# Patient Record
Sex: Female | Born: 1990
Health system: Southern US, Community
[De-identification: ages and names within clinical notes are randomized; demographics above are authoritative.]

## PROBLEM LIST (undated history)

## (undated) DIAGNOSIS — M25569 Pain in unspecified knee: Secondary | ICD-10-CM

## (undated) DIAGNOSIS — G5632 Lesion of radial nerve, left upper limb: Secondary | ICD-10-CM

## (undated) DIAGNOSIS — F329 Major depressive disorder, single episode, unspecified: Secondary | ICD-10-CM

## (undated) DIAGNOSIS — R12 Heartburn: Secondary | ICD-10-CM

## (undated) DIAGNOSIS — M549 Dorsalgia, unspecified: Secondary | ICD-10-CM

## (undated) DIAGNOSIS — R0602 Shortness of breath: Secondary | ICD-10-CM

## (undated) DIAGNOSIS — G8929 Other chronic pain: Secondary | ICD-10-CM

## (undated) DIAGNOSIS — M2550 Pain in unspecified joint: Secondary | ICD-10-CM

## (undated) DIAGNOSIS — T7840XA Allergy, unspecified, initial encounter: Secondary | ICD-10-CM

## (undated) DIAGNOSIS — F419 Anxiety disorder, unspecified: Secondary | ICD-10-CM

## (undated) DIAGNOSIS — F32A Depression, unspecified: Secondary | ICD-10-CM

## (undated) DIAGNOSIS — J45909 Unspecified asthma, uncomplicated: Secondary | ICD-10-CM

## (undated) DIAGNOSIS — T148XXA Other injury of unspecified body region, initial encounter: Secondary | ICD-10-CM

## (undated) DIAGNOSIS — G709 Myoneural disorder, unspecified: Secondary | ICD-10-CM

## (undated) HISTORY — DX: Pain in unspecified joint: M25.50

## (undated) HISTORY — PX: OTHER SURGICAL HISTORY: SHX169

## (undated) HISTORY — PX: FRACTURE SURGERY: SHX138

## (undated) HISTORY — DX: Shortness of breath: R06.02

## (undated) HISTORY — PX: HERNIA REPAIR: SHX51

## (undated) HISTORY — DX: Allergy, unspecified, initial encounter: T78.40XA

## (undated) HISTORY — DX: Myoneural disorder, unspecified: G70.9

## (undated) HISTORY — DX: Heartburn: R12

---
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
## 2012-07-24 ENCOUNTER — Encounter (HOSPITAL_COMMUNITY): Payer: Self-pay | Admitting: Obstetrics and Gynecology

## 2012-07-24 ENCOUNTER — Inpatient Hospital Stay (HOSPITAL_COMMUNITY): Payer: BC Managed Care – PPO

## 2012-07-24 ENCOUNTER — Inpatient Hospital Stay (HOSPITAL_COMMUNITY)
Admission: AD | Admit: 2012-07-24 | Discharge: 2012-07-24 | Disposition: A | Discharge status: Home / Self Care | Payer: BC Managed Care – PPO | Source: Ambulatory Visit | Attending: Obstetrics and Gynecology | Admitting: Obstetrics and Gynecology

## 2012-07-24 DIAGNOSIS — N949 Unspecified condition associated with female genital organs and menstrual cycle: Secondary | ICD-10-CM | POA: Insufficient documentation

## 2012-07-24 DIAGNOSIS — R109 Unspecified abdominal pain: Secondary | ICD-10-CM | POA: Insufficient documentation

## 2012-07-24 DIAGNOSIS — R102 Pelvic and perineal pain: Secondary | ICD-10-CM

## 2012-07-24 DIAGNOSIS — Z975 Presence of (intrauterine) contraceptive device: Secondary | ICD-10-CM | POA: Insufficient documentation

## 2012-07-24 DIAGNOSIS — N831 Corpus luteum cyst of ovary, unspecified side: Secondary | ICD-10-CM

## 2012-07-24 DIAGNOSIS — R8281 Pyuria: Secondary | ICD-10-CM

## 2012-07-24 HISTORY — DX: Depression, unspecified: F32.A

## 2012-07-24 HISTORY — DX: Major depressive disorder, single episode, unspecified: F32.9

## 2012-07-24 HISTORY — DX: Other chronic pain: G89.29

## 2012-07-24 HISTORY — DX: Dorsalgia, unspecified: M54.9

## 2012-07-24 HISTORY — DX: Pain in unspecified knee: M25.569

## 2012-07-24 LAB — URINALYSIS, ROUTINE W REFLEX MICROSCOPIC
Bilirubin Urine: NEGATIVE
Glucose, UA: NEGATIVE mg/dL
Ketones, ur: NEGATIVE mg/dL
Nitrite: NEGATIVE
Protein, ur: NEGATIVE mg/dL
Urobilinogen, UA: 0.2 mg/dL (ref 0.0–1.0)
pH: 7 (ref 5.0–8.0)

## 2012-07-24 LAB — URINE MICROSCOPIC-ADD ON

## 2012-07-24 LAB — POCT PREGNANCY, URINE: Preg Test, Ur: NEGATIVE

## 2012-07-24 MED ORDER — CIPROFLOXACIN HCL 500 MG PO TABS: 500.0000 mg | ORAL_TABLET | Freq: Two times a day (BID) | ORAL | Status: DC

## 2012-07-24 MED ORDER — TRAMADOL HCL 50 MG PO TABS: 50.0000 mg | ORAL_TABLET | Freq: Three times a day (TID) | ORAL | Status: DC | PRN

## 2012-07-24 MED ORDER — TRAMADOL HCL 50 MG PO TABS
50.0000 mg | ORAL_TABLET | Freq: Once | ORAL | Status: AC
  Administered 2012-07-24: 50 mg via ORAL
  Filled 2012-07-24: qty 1

## 2012-07-24 NOTE — MAU Provider Note (Signed)
History     CSN: [id number]  Arrival date and time: 07/24/12 7829   First Provider Initiated Contact with Patient 07/24/12 1025      Chief Complaint  Patient presents with  . Abdominal Pain   HPI This is a 22 y.o. female who was sent from an Urgent Care center for evaluation of pelvic pain. Had an IUD placed in January by Dr Langston Masker. Had intercourse last night and had sharp pain in her left side. Pain has lessened today after taking med. No fever or other symptoms. Did not call [facility] about this. Urgent Care provider told her she "probably has a rupture in her uterus and could die from it".  RN Note: Pt reports having pain after intercourse last night. Had sharp shooting pain in left side. Pain is less today has taken maloxican with some relief.. Had small amount of bleeding. IUD placed in January.  OB History   Grav Para Term Preterm Abortions TAB SAB Ect Mult Living   0 0 0 0 0 0 0 0 0 0       Past Medical History  Diagnosis Date  . Depression   . Knee pain, chronic   . Back pain, chronic     Past Surgical History  Procedure Laterality Date  . No past surgeries      Family History  Problem Relation Age of Onset  . Adopted: Yes  . Heart disease Father     MI    History  Substance Use Topics  . Smoking status: Never Smoker   . Smokeless tobacco: Not on file  . Alcohol Use: Yes    Allergies: No Known Allergies  Prescriptions prior to admission  Medication Sig Dispense Refill  . albuterol (PROVENTIL HFA;VENTOLIN HFA) 108 (90 BASE) MCG/ACT inhaler Inhale 2 puffs into the lungs every 6 (six) hours as needed for wheezing or shortness of breath.      . levonorgestrel (MIRENA) 20 MCG/24HR IUD 1 each by Intrauterine route once.        Review of Systems  Constitutional: Negative for fever, chills and malaise/fatigue.  Gastrointestinal: Positive for abdominal pain. Negative for nausea, vomiting, diarrhea and constipation.  Genitourinary: Negative for  dysuria.  Neurological: Negative for dizziness.   Physical Exam   Blood pressure 112/61, pulse 85, temperature 98.5 F (36.9 C), resp. rate 18, height 5\' 3"  (1.6 m), weight 58.151 kg (128 lb 3.2 oz).  Physical Exam  Constitutional: She is oriented to person, place, and time. She appears well-developed and well-nourished. No distress.  HENT:  Head: Normocephalic.  Cardiovascular: Normal rate.   Respiratory: Effort normal.  GI: Soft. She exhibits no distension and no mass. There is tenderness (over lower pelvis bilaterally). There is no rebound and no guarding.  Genitourinary: Vagina normal and uterus normal. No vaginal discharge (string visible) found.  Musculoskeletal: Normal range of motion.  Neurological: She is alert and oriented to person, place, and time.  Skin: Skin is warm and dry.  Psychiatric: She has a normal mood and affect.    MAU Course  Procedures  MDM Will check [place] to rule out malposition and/or ovarian cysts  US Pelvis Complete  07/24/2012   *RADIOLOGY REPORT*  Clinical Data: Severe postcoital pelvic pain, IUD  TRANSABDOMINAL AND TRANSVAGINAL ULTRASOUND OF PELVIS Technique:  Both transabdominal and transvaginal ultrasound examinations of the pelvis were performed. Transabdominal technique was performed for global imaging of the pelvis including uterus, ovaries, adnexal regions, and pelvic cul-de-sac.  It was necessary  to proceed with endovaginal exam following the transabdominal exam to visualize the endometrium.  Comparison:  None  Findings:  Uterus: Normal in size and appearance, measuring 7.4 x 4.2 x 4.8 cm.  Endometrium: IUD in satisfactory position.  Right ovary:  Normal appearance/no adnexal mass, measuring 4.4 x 1.7 x 3.4 cm.  Left ovary: Normal appearance/no adnexal mass, measuring 4.2 x 3.4 x 2.9 cm, and notable for a 1.9 cm corpus luteal cyst.  Other findings: Small volume pelvic ascites/hemorrhage.  IMPRESSION: IUD in satisfactory position.  1.9 cm left corpus  luteal cyst.  Small volume pelvic ascites/hemorrhage.   Original Report Authenticated By: Charline Bills, M.D.    Assessment and Plan  A:  Pelvic pain, probably due to ruptured cyst      IUD in place  P:  Discussed with Dr Marcelle Overlie       Conservative care       Rx Tramadol per request since she has to be in a wedding today       Instructed to call office Monday if not better  [facility] 07/24/2012, 10:31 AM

## 2012-07-24 NOTE — MAU Note (Signed)
Susan Boyle is here today with complaints of abdominal pain, and light vaginal bleeding that she saw on her sheets this morning when she woke up. She had intercourse last night and it was very painful. She went to urgent care this morning and they told her to come directly here.

## 2012-07-24 NOTE — MAU Note (Addendum)
Pt reports having pain after intercourse last night. Had sharp shooting pain in left side. Pain is less today has taken maloxican with some relief.. Had small amount of bleeding. IUD placed in January.

## 2012-07-25 LAB — URINE CULTURE: Colony Count: 35000

## 2012-10-20 ENCOUNTER — Other Ambulatory Visit (HOSPITAL_COMMUNITY): Payer: Self-pay | Admitting: Obstetrics & Gynecology

## 2012-10-20 DIAGNOSIS — R1032 Left lower quadrant pain: Secondary | ICD-10-CM

## 2012-10-22 ENCOUNTER — Encounter (HOSPITAL_COMMUNITY): Payer: Self-pay

## 2012-10-22 ENCOUNTER — Ambulatory Visit (HOSPITAL_COMMUNITY)
Admission: RE | Admit: 2012-10-22 | Discharge: 2012-10-22 | Disposition: A | Discharge status: Home / Self Care | Payer: BC Managed Care – PPO | Source: Ambulatory Visit | Attending: Obstetrics & Gynecology | Admitting: Obstetrics & Gynecology

## 2012-10-22 DIAGNOSIS — K7689 Other specified diseases of liver: Secondary | ICD-10-CM | POA: Insufficient documentation

## 2012-10-22 DIAGNOSIS — R1032 Left lower quadrant pain: Secondary | ICD-10-CM | POA: Insufficient documentation

## 2012-10-22 DIAGNOSIS — Z30431 Encounter for routine checking of intrauterine contraceptive device: Secondary | ICD-10-CM | POA: Insufficient documentation

## 2012-10-22 HISTORY — DX: Unspecified asthma, uncomplicated: J45.909

## 2012-10-22 MED ORDER — IOHEXOL 300 MG/ML  SOLN
100.0000 mL | Freq: Once | INTRAMUSCULAR | Status: AC | PRN
  Administered 2012-10-22: 100 mL via INTRAVENOUS

## 2013-05-26 ENCOUNTER — Encounter: Payer: Self-pay | Admitting: *Deleted

## 2013-05-30 ENCOUNTER — Telehealth: Payer: Self-pay | Admitting: Neurology

## 2013-05-30 ENCOUNTER — Ambulatory Visit: Payer: BC Managed Care – PPO | Admitting: Neurology

## 2013-05-30 NOTE — Telephone Encounter (Signed)
This patient did not show for a new patient appointment today. 

## 2013-07-05 ENCOUNTER — Encounter (HOSPITAL_COMMUNITY): Payer: Self-pay | Admitting: Emergency Medicine

## 2013-07-05 ENCOUNTER — Emergency Department (HOSPITAL_COMMUNITY)
Admission: EM | Admit: 2013-07-05 | Discharge: 2013-07-06 | Disposition: A | Discharge status: Home / Self Care | Payer: BC Managed Care – PPO | Attending: Emergency Medicine | Admitting: Emergency Medicine

## 2013-07-05 DIAGNOSIS — Z792 Long term (current) use of antibiotics: Secondary | ICD-10-CM | POA: Insufficient documentation

## 2013-07-05 DIAGNOSIS — Z3202 Encounter for pregnancy test, result negative: Secondary | ICD-10-CM | POA: Insufficient documentation

## 2013-07-05 DIAGNOSIS — G8929 Other chronic pain: Secondary | ICD-10-CM | POA: Insufficient documentation

## 2013-07-05 DIAGNOSIS — Z975 Presence of (intrauterine) contraceptive device: Secondary | ICD-10-CM | POA: Insufficient documentation

## 2013-07-05 DIAGNOSIS — F17200 Nicotine dependence, unspecified, uncomplicated: Secondary | ICD-10-CM | POA: Insufficient documentation

## 2013-07-05 DIAGNOSIS — J45909 Unspecified asthma, uncomplicated: Secondary | ICD-10-CM | POA: Insufficient documentation

## 2013-07-05 DIAGNOSIS — Z79899 Other long term (current) drug therapy: Secondary | ICD-10-CM | POA: Insufficient documentation

## 2013-07-05 DIAGNOSIS — R42 Dizziness and giddiness: Secondary | ICD-10-CM | POA: Insufficient documentation

## 2013-07-05 DIAGNOSIS — F329 Major depressive disorder, single episode, unspecified: Secondary | ICD-10-CM | POA: Insufficient documentation

## 2013-07-05 DIAGNOSIS — F3289 Other specified depressive episodes: Secondary | ICD-10-CM | POA: Insufficient documentation

## 2013-07-05 DIAGNOSIS — K5289 Other specified noninfective gastroenteritis and colitis: Secondary | ICD-10-CM | POA: Insufficient documentation

## 2013-07-05 DIAGNOSIS — K529 Noninfective gastroenteritis and colitis, unspecified: Secondary | ICD-10-CM

## 2013-07-05 LAB — CBC WITH DIFFERENTIAL/PLATELET
Basophils Absolute: 0 10*3/uL (ref 0.0–0.1)
Basophils Relative: 0 % (ref 0–1)
Eosinophils Absolute: 0 10*3/uL (ref 0.0–0.7)
Eosinophils Relative: 1 % (ref 0–5)
HCT: 39.6 % (ref 36.0–46.0)
Hemoglobin: 13.8 g/dL (ref 12.0–15.0)
Lymphocytes Relative: 29 % (ref 12–46)
Lymphs Abs: 1.8 10*3/uL (ref 0.7–4.0)
MCH: 29.7 pg (ref 26.0–34.0)
MCHC: 34.8 g/dL (ref 30.0–36.0)
MCV: 85.3 fL (ref 78.0–100.0)
Monocytes Absolute: 0.4 10*3/uL (ref 0.1–1.0)
Monocytes Relative: 7 % (ref 3–12)
Neutro Abs: 4 10*3/uL (ref 1.7–7.7)
Neutrophils Relative %: 64 % (ref 43–77)
Platelets: 185 10*3/uL (ref 150–400)
RBC: 4.64 MIL/uL (ref 3.87–5.11)
RDW: 13.6 % (ref 11.5–15.5)
WBC: 6.2 10*3/uL (ref 4.0–10.5)

## 2013-07-05 LAB — COMPREHENSIVE METABOLIC PANEL
ALT: 9 U/L (ref 0–35)
AST: 17 U/L (ref 0–37)
Albumin: 4.7 g/dL (ref 3.5–5.2)
Alkaline Phosphatase: 47 U/L (ref 39–117)
BUN: 12 mg/dL (ref 6–23)
CO2: 23 mEq/L (ref 19–32)
Calcium: 9.8 mg/dL (ref 8.4–10.5)
Chloride: 98 mEq/L (ref 96–112)
Creatinine, Ser: 0.6 mg/dL (ref 0.50–1.10)
GFR calc Af Amer: >90 mL/min (ref >90)
GFR calc non Af Amer: >90 mL/min (ref >90)
Glucose, Bld: 76 mg/dL (ref 70–99)
Potassium: 3.8 mEq/L (ref 3.7–5.3)
Sodium: 137 mEq/L (ref 137–147)
Total Bilirubin: 0.5 mg/dL (ref 0.3–1.2)
Total Protein: 7.7 g/dL (ref 6.0–8.3)

## 2013-07-05 LAB — URINALYSIS, ROUTINE W REFLEX MICROSCOPIC
Bilirubin Urine: NEGATIVE
Glucose, UA: NEGATIVE mg/dL
Ketones, ur: 40 mg/dL — AB
Nitrite: NEGATIVE
Protein, ur: NEGATIVE mg/dL
Specific Gravity, Urine: 1.02 (ref 1.005–1.030)
Urobilinogen, UA: 0.2 mg/dL (ref 0.0–1.0)
pH: 5.5 (ref 5.0–8.0)

## 2013-07-05 LAB — URINE MICROSCOPIC-ADD ON

## 2013-07-05 LAB — PREGNANCY, URINE: Preg Test, Ur: NEGATIVE

## 2013-07-05 MED ORDER — ONDANSETRON HCL 4 MG/2ML IJ SOLN
4.0000 mg | Freq: Once | INTRAMUSCULAR | Status: AC
  Administered 2013-07-05: 4 mg via INTRAVENOUS
  Filled 2013-07-05: qty 2

## 2013-07-05 MED ORDER — SODIUM CHLORIDE 0.9 % IV BOLUS (SEPSIS)
2000.0000 mL | Freq: Once | INTRAVENOUS | Status: AC
  Administered 2013-07-05: 2000 mL via INTRAVENOUS

## 2013-07-05 MED ORDER — PROMETHAZINE HCL 25 MG PO TABS: 25.0000 mg | ORAL_TABLET | Freq: Three times a day (TID) | ORAL | Status: DC | PRN

## 2013-07-05 NOTE — ED Notes (Signed)
Pt reports cp with dizziness, and n/v today.  Reports "throwing up a chunk of blood."  Pt reports hx of anxiety.

## 2013-07-05 NOTE — Discharge Instructions (Signed)
Return here as needed.  Slowly increase her fluid intake, rest as much as possible.  Your testing here today didn't show any significant abnormalities other than mild dehydration

## 2013-07-06 LAB — URINE CULTURE
Colony Count: NO GROWTH
Culture: NO GROWTH

## 2013-07-09 NOTE — ED Provider Notes (Signed)
CSN: [id number]     Arrival date & time 07/05/13  1908 History   First MD Initiated Contact with Patient 07/05/13 2020     Chief Complaint  Patient presents with  . Dizziness  . Hematemesis     (Consider location/radiation/quality/duration/timing/severity/associated sxs/prior Treatment) HPI Patient presents to the emergency department with dizziness and n/v that started today. The patient states that she has had increased outdoor activities. The patient states that she had a "chunk" of blood that was mixed into her vomit.  Patient denies chest pain, shortness of breath, diarrhea, abdominal pain, headache, blurred vision, weakness, numbness, back pain, dysuria, fever, or syncope.  Patient, states, that seems to make her condition, better or worse.  The patient did not take any medications prior to arrival.  Her symptoms have been persistent throughout the day Past Medical History  Diagnosis Date  . Depression   . Knee pain, chronic   . Back pain, chronic   . Asthma    Past Surgical History  Procedure Laterality Date  . No past surgeries     Family History  Problem Relation Age of Onset  . Adopted: Yes  . Heart disease Father     MI   History  Substance Use Topics  . Smoking status: Current Every Day Smoker    Types: Cigarettes  . Smokeless tobacco: Never Used  . Alcohol Use: Yes   OB History   Grav Para Term Preterm Abortions TAB SAB Ect Mult Living   0 0 0 0 0 0 0 0 0 0      Review of Systems  All other systems negative except as documented in the HPI. All pertinent positives and negatives as reviewed in the HPI.   Allergies  Sulfa antibiotics  Home Medications   Prior to Admission medications   Medication Sig Start Date End Date Taking? Authorizing Provider  albuterol (PROVENTIL HFA;VENTOLIN HFA) 108 (90 BASE) MCG/ACT inhaler Inhale 2 puffs into the lungs every 6 (six) hours as needed for wheezing or shortness of breath.   Yes Historical Provider, MD   amphetamine-dextroamphetamine (ADDERALL) 10 MG tablet Take 10 mg by mouth 2 (two) times daily with a meal.   Yes Historical Provider, MD  cephALEXin (KEFLEX) 500 MG capsule Take 500 mg by mouth 3 (three) times daily. 07/02/13  Yes Historical Provider, MD  levonorgestrel (MIRENA) 20 MCG/24HR IUD 1 each by Intrauterine route once.   Yes Historical Provider, MD  PARoxetine (PAXIL) 10 MG tablet Take 10 mg by mouth daily.   Yes Historical Provider, MD  promethazine (PHENERGAN) 25 MG tablet Take 1 tablet (25 mg total) by mouth every 8 (eight) hours as needed for nausea or vomiting. 07/05/13   Jamesetta Orleanshristopher W Lelia Jons, PA-C   BP 110/60  Pulse 84  Temp(Src) 98.3 F (36.8 C) (Oral)  Resp 18  SpO2 99% Physical Exam  Nursing note and vitals reviewed. Constitutional: She is oriented to person, place, and time. She appears well-developed and well-nourished. No distress.  HENT:  Head: Normocephalic and atraumatic.  Mouth/Throat: Oropharynx is clear and moist.  Eyes: Pupils are equal, round, and reactive to light.  Neck: Normal range of motion. Neck supple.  Cardiovascular: Normal rate, regular rhythm and normal heart sounds.  Exam reveals no gallop and no friction rub.   No murmur heard. Pulmonary/Chest: Effort normal and breath sounds normal. No respiratory distress.  Abdominal: Soft. Bowel sounds are normal. She exhibits no distension. There is no tenderness.  Musculoskeletal: Normal range of motion. She  exhibits no edema.  Neurological: She is alert and oriented to person, place, and time. She exhibits normal muscle tone. Coordination normal.  Skin: Skin is warm and dry.    ED Course  Procedures (including critical care time) Labs Review Labs Reviewed  URINALYSIS, ROUTINE W REFLEX MICROSCOPIC - Abnormal; Notable for the following:    APPearance CLOUDY (*)    Hgb urine dipstick TRACE (*)    Ketones, ur 40 (*)    Leukocytes, UA SMALL (*)    All other components within normal limits  URINE  MICROSCOPIC-ADD ON - Abnormal; Notable for the following:    Squamous Epithelial / LPF FEW (*)    All other components within normal limits  URINE CULTURE  CBC WITH DIFFERENTIAL  COMPREHENSIVE METABOLIC PANEL  PREGNANCY, URINE    Imaging Review No results found.   EKG Interpretation   Date/Time:  Tuesday Jul 05 2013 20:14:01 EDT Ventricular Rate:  86 PR Interval:  143 QRS Duration: 79 QT Interval:  369 QTC Calculation: 441 R Axis:   78 Text Interpretation:  Sinus rhythm Biatrial enlargement ED PHYSICIAN  INTERPRETATION AVAILABLE IN [facility] Confirmed by TEST, Record  ([id number]) on 07/07/2013 7:20:11 AM     Patient's been given IV fluids and is feeling has improved at this time.  She was ambulated without, difficulty.  Patient did not have any further symptoms of dizziness, or near syncope.  Told to return to the emergency department as needed.  Increase her fluid intake    Carlyle DollyChristopher W Deavion Dobbs, [place] JerseyPA-C 07/09/13 [phone]

## 2013-07-09 NOTE — ED Provider Notes (Signed)
  Medical screening examination/treatment/procedure(s) were performed by non-physician practitioner and as supervising physician I was immediately available for consultation/collaboration.   EKG Interpretation   Date/Time:  Tuesday Jul 05 2013 20:14:01 EDT Ventricular Rate:  86 PR Interval:  143 QRS Duration: 79 QT Interval:  369 QTC Calculation: 441 R Axis:   78 Text Interpretation:  Sinus rhythm Biatrial enlargement ED PHYSICIAN  INTERPRETATION AVAILABLE IN [facility] Confirmed by TEST, Record  ([id number]) on 07/07/2013 7:20:11 AM         Gerhard Munchobert Jamorris Ndiaye, MD 07/09/13 [phone]

## 2013-07-13 ENCOUNTER — Other Ambulatory Visit: Payer: Self-pay | Admitting: Orthopedic Surgery

## 2013-07-13 ENCOUNTER — Encounter (HOSPITAL_BASED_OUTPATIENT_CLINIC_OR_DEPARTMENT_OTHER): Payer: Self-pay | Admitting: *Deleted

## 2013-07-20 ENCOUNTER — Encounter (HOSPITAL_BASED_OUTPATIENT_CLINIC_OR_DEPARTMENT_OTHER): Payer: Self-pay | Admitting: Certified Registered"

## 2013-07-20 ENCOUNTER — Encounter (HOSPITAL_BASED_OUTPATIENT_CLINIC_OR_DEPARTMENT_OTHER)
Admission: RE | Disposition: A | Discharge status: Home / Self Care | Payer: Self-pay | Source: Ambulatory Visit | Attending: Orthopedic Surgery

## 2013-07-20 ENCOUNTER — Ambulatory Visit (HOSPITAL_BASED_OUTPATIENT_CLINIC_OR_DEPARTMENT_OTHER)
Admission: RE | Admit: 2013-07-20 | Discharge: 2013-07-20 | Disposition: A | Discharge status: Home / Self Care | Payer: BC Managed Care – PPO | Source: Ambulatory Visit | Attending: Orthopedic Surgery | Admitting: Orthopedic Surgery

## 2013-07-20 ENCOUNTER — Ambulatory Visit (HOSPITAL_BASED_OUTPATIENT_CLINIC_OR_DEPARTMENT_OTHER): Payer: BC Managed Care – PPO | Admitting: Certified Registered"

## 2013-07-20 ENCOUNTER — Encounter (HOSPITAL_BASED_OUTPATIENT_CLINIC_OR_DEPARTMENT_OTHER): Payer: BC Managed Care – PPO | Admitting: Certified Registered"

## 2013-07-20 DIAGNOSIS — M129 Arthropathy, unspecified: Secondary | ICD-10-CM | POA: Insufficient documentation

## 2013-07-20 DIAGNOSIS — W261XXA Contact with sword or dagger, initial encounter: Secondary | ICD-10-CM

## 2013-07-20 DIAGNOSIS — J45909 Unspecified asthma, uncomplicated: Secondary | ICD-10-CM | POA: Insufficient documentation

## 2013-07-20 DIAGNOSIS — S61209A Unspecified open wound of unspecified finger without damage to nail, initial encounter: Secondary | ICD-10-CM | POA: Insufficient documentation

## 2013-07-20 DIAGNOSIS — F17200 Nicotine dependence, unspecified, uncomplicated: Secondary | ICD-10-CM | POA: Insufficient documentation

## 2013-07-20 DIAGNOSIS — Z79899 Other long term (current) drug therapy: Secondary | ICD-10-CM | POA: Insufficient documentation

## 2013-07-20 DIAGNOSIS — W260XXA Contact with knife, initial encounter: Secondary | ICD-10-CM | POA: Insufficient documentation

## 2013-07-20 DIAGNOSIS — IMO0002 Reserved for concepts with insufficient information to code with codable children: Secondary | ICD-10-CM | POA: Insufficient documentation

## 2013-07-20 DIAGNOSIS — Y929 Unspecified place or not applicable: Secondary | ICD-10-CM | POA: Insufficient documentation

## 2013-07-20 DIAGNOSIS — F411 Generalized anxiety disorder: Secondary | ICD-10-CM | POA: Insufficient documentation

## 2013-07-20 DIAGNOSIS — F329 Major depressive disorder, single episode, unspecified: Secondary | ICD-10-CM | POA: Insufficient documentation

## 2013-07-20 DIAGNOSIS — F3289 Other specified depressive episodes: Secondary | ICD-10-CM | POA: Insufficient documentation

## 2013-07-20 DIAGNOSIS — Z882 Allergy status to sulfonamides status: Secondary | ICD-10-CM | POA: Insufficient documentation

## 2013-07-20 HISTORY — PX: NERVE, TENDON AND ARTERY REPAIR: SHX5695

## 2013-07-20 HISTORY — DX: Anxiety disorder, unspecified: F41.9

## 2013-07-20 LAB — POCT HEMOGLOBIN-HEMACUE: Hemoglobin: 13 g/dL (ref 12.0–15.0)

## 2013-07-20 SURGERY — NERVE, TENDON AND ARTERY REPAIR
Anesthesia: Monitor Anesthesia Care | Site: Thumb | Laterality: Left

## 2013-07-20 MED ORDER — OXYCODONE HCL 5 MG PO TABS: 5.0000 mg | ORAL_TABLET | Freq: Once | ORAL | Status: DC | PRN

## 2013-07-20 MED ORDER — LACTATED RINGERS IV SOLN
INTRAVENOUS | Status: DC
  Administered 2013-07-20: 11:00:00 via INTRAVENOUS

## 2013-07-20 MED ORDER — ONDANSETRON HCL 4 MG/2ML IJ SOLN: 4.0000 mg | Freq: Four times a day (QID) | INTRAMUSCULAR | Status: DC | PRN

## 2013-07-20 MED ORDER — CHLORHEXIDINE GLUCONATE 4 % EX LIQD: 60.0000 mL | Freq: Once | CUTANEOUS | Status: DC

## 2013-07-20 MED ORDER — MIDAZOLAM HCL 2 MG/2ML IJ SOLN
INTRAMUSCULAR | Status: AC
  Filled 2013-07-20: qty 2

## 2013-07-20 MED ORDER — CEFAZOLIN SODIUM-DEXTROSE 2-3 GM-% IV SOLR: 2.0000 g | INTRAVENOUS | Status: DC

## 2013-07-20 MED ORDER — BUPIVACAINE-EPINEPHRINE (PF) 0.5% -1:200000 IJ SOLN
INTRAMUSCULAR | Status: DC | PRN
  Administered 2013-07-20: 30 mL

## 2013-07-20 MED ORDER — OXYCODONE HCL 5 MG/5ML PO SOLN: 5.0000 mg | Freq: Once | ORAL | Status: DC | PRN

## 2013-07-20 MED ORDER — FENTANYL CITRATE 0.05 MG/ML IJ SOLN
INTRAMUSCULAR | Status: AC
  Filled 2013-07-20: qty 6

## 2013-07-20 MED ORDER — PROPOFOL 10 MG/ML IV EMUL
INTRAVENOUS | Status: AC
  Filled 2013-07-20: qty 50

## 2013-07-20 MED ORDER — ONDANSETRON HCL 4 MG/2ML IJ SOLN
INTRAMUSCULAR | Status: DC | PRN
  Administered 2013-07-20: 4 mg via INTRAVENOUS

## 2013-07-20 MED ORDER — MIDAZOLAM HCL 2 MG/2ML IJ SOLN
1.0000 mg | INTRAMUSCULAR | Status: DC | PRN
  Administered 2013-07-20: 2 mg via INTRAVENOUS

## 2013-07-20 MED ORDER — CEFAZOLIN SODIUM-DEXTROSE 2-3 GM-% IV SOLR
2.0000 g | INTRAVENOUS | Status: AC
  Administered 2013-07-20: 2 g via INTRAVENOUS

## 2013-07-20 MED ORDER — HYDROCODONE-ACETAMINOPHEN 5-325 MG PO TABS: 1.0000 | ORAL_TABLET | Freq: Four times a day (QID) | ORAL | Status: DC | PRN

## 2013-07-20 MED ORDER — BUPIVACAINE HCL (PF) 0.25 % IJ SOLN
INTRAMUSCULAR | Status: AC
  Filled 2013-07-20: qty 30

## 2013-07-20 MED ORDER — HYDROMORPHONE HCL PF 1 MG/ML IJ SOLN: 0.2500 mg | INTRAMUSCULAR | Status: DC | PRN

## 2013-07-20 MED ORDER — FENTANYL CITRATE 0.05 MG/ML IJ SOLN
50.0000 ug | INTRAMUSCULAR | Status: DC | PRN
  Administered 2013-07-20: 100 ug via INTRAVENOUS

## 2013-07-20 MED ORDER — CEFAZOLIN SODIUM-DEXTROSE 2-3 GM-% IV SOLR
INTRAVENOUS | Status: AC
  Filled 2013-07-20: qty 50

## 2013-07-20 MED ORDER — FENTANYL CITRATE 0.05 MG/ML IJ SOLN
INTRAMUSCULAR | Status: AC
  Filled 2013-07-20: qty 2

## 2013-07-20 SURGICAL SUPPLY — 58 items
BAG DECANTER FOR FLEXI CONT (MISCELLANEOUS) IMPLANT
BLADE MINI RND TIP GREEN BEAV (BLADE) ×2 IMPLANT
BLADE SURG 15 STRL LF DISP TIS (BLADE) ×1 IMPLANT
BLADE SURG 15 STRL SS (BLADE) ×1
BNDG COHESIVE 3X5 TAN STRL LF (GAUZE/BANDAGES/DRESSINGS) ×2 IMPLANT
BNDG ESMARK 4X9 LF (GAUZE/BANDAGES/DRESSINGS) ×2 IMPLANT
BNDG GAUZE ELAST 4 BULKY (GAUZE/BANDAGES/DRESSINGS) ×2 IMPLANT
CHLORAPREP W/TINT 26ML (MISCELLANEOUS) ×2 IMPLANT
CONNECTOR NERVE AXOGUARD 2X10 (Orthopedic Implant) ×2 IMPLANT
CORDS BIPOLAR (ELECTRODE) ×2 IMPLANT
COVER MAYO STAND STRL (DRAPES) ×2 IMPLANT
COVER TABLE BACK 60X90 (DRAPES) ×2 IMPLANT
CUFF TOURNIQUET SINGLE 18IN (TOURNIQUET CUFF) IMPLANT
DECANTER SPIKE VIAL GLASS SM (MISCELLANEOUS) IMPLANT
DRAPE EXTREMITY T 121X128X90 (DRAPE) ×2 IMPLANT
DRAPE SURG 17X23 STRL (DRAPES) ×2 IMPLANT
DRSG KUZMA FLUFF (GAUZE/BANDAGES/DRESSINGS) IMPLANT
GAUZE SPONGE 4X4 12PLY STRL (GAUZE/BANDAGES/DRESSINGS) ×2 IMPLANT
GAUZE XEROFORM 1X8 LF (GAUZE/BANDAGES/DRESSINGS) ×2 IMPLANT
GLOVE BIO SURGEON STRL SZ8.5 (GLOVE) ×2 IMPLANT
GLOVE BIOGEL PI IND STRL 7.0 (GLOVE) ×1 IMPLANT
GLOVE BIOGEL PI IND STRL 8.5 (GLOVE) ×2 IMPLANT
GLOVE BIOGEL PI INDICATOR 7.0 (GLOVE) ×1
GLOVE BIOGEL PI INDICATOR 8.5 (GLOVE) ×2
GLOVE ECLIPSE 6.5 STRL STRAW (GLOVE) ×2 IMPLANT
GLOVE EXAM NITRILE LRG STRL (GLOVE) ×2 IMPLANT
GLOVE SURG ORTHO 8.0 STRL STRW (GLOVE) ×2 IMPLANT
GLOVE SURG SYN 8.0 (GLOVE) ×2 IMPLANT
GOWN STRL REUS W/ TWL LRG LVL3 (GOWN DISPOSABLE) ×1 IMPLANT
GOWN STRL REUS W/TWL LRG LVL3 (GOWN DISPOSABLE) ×1
GOWN STRL REUS W/TWL XL LVL3 (GOWN DISPOSABLE) ×4 IMPLANT
LOOP VESSEL MAXI BLUE (MISCELLANEOUS) IMPLANT
NDL SAFETY ECLIPSE 18X1.5 (NEEDLE) IMPLANT
NEEDLE 27GAX1X1/2 (NEEDLE) IMPLANT
NEEDLE HYPO 18GX1.5 SHARP (NEEDLE)
NS IRRIG 1000ML POUR BTL (IV SOLUTION) ×2 IMPLANT
PACK BASIN DAY SURGERY FS (CUSTOM PROCEDURE TRAY) ×2 IMPLANT
PAD CAST 3X4 CTTN HI CHSV (CAST SUPPLIES) ×1 IMPLANT
PAD CAST 4YDX4 CTTN HI CHSV (CAST SUPPLIES) IMPLANT
PADDING CAST ABS 4INX4YD NS (CAST SUPPLIES)
PADDING CAST ABS COTTON 4X4 ST (CAST SUPPLIES) IMPLANT
PADDING CAST COTTON 3X4 STRL (CAST SUPPLIES) ×1
PADDING CAST COTTON 4X4 STRL (CAST SUPPLIES)
SLEEVE SCD COMPRESS KNEE MED (MISCELLANEOUS) IMPLANT
SPEAR EYE SURG WECK-CEL (MISCELLANEOUS) ×2 IMPLANT
SPLINT PLASTER CAST XFAST 3X15 (CAST SUPPLIES) IMPLANT
SPLINT PLASTER XTRA FASTSET 3X (CAST SUPPLIES)
STOCKINETTE 4X48 STRL (DRAPES) ×2 IMPLANT
SUT ETHIBOND 3-0 V-5 (SUTURE) IMPLANT
SUT MERSILENE 6 0 P 1 (SUTURE) IMPLANT
SUT NYLON 9 0 VRM6 (SUTURE) IMPLANT
SUT SILK 4 0 PS 2 (SUTURE) ×2 IMPLANT
SUT VICRYL 4-0 PS2 18IN ABS (SUTURE) IMPLANT
SUT VICRYL RAPIDE 4/0 PS 2 (SUTURE) ×2 IMPLANT
SYR BULB 3OZ (MISCELLANEOUS) ×2 IMPLANT
SYR CONTROL 10ML LL (SYRINGE) ×2 IMPLANT
TOWEL OR 17X24 6PK STRL BLUE (TOWEL DISPOSABLE) ×2 IMPLANT
UNDERPAD 30X30 INCONTINENT (UNDERPADS AND DIAPERS) ×2 IMPLANT

## 2013-07-20 NOTE — Anesthesia Postprocedure Evaluation (Signed)
Anesthesia Post Note  Patient: Susan Boyle  Procedure(s) Performed: Procedure(s) (LRB): NERVE,  REPAIR (Left)  Anesthesia type: MAC and supraclavicular nerve block  Patient location: PACU  Post pain: Pain level controlled and Adequate analgesia  Post assessment: Post-op Vital signs reviewed, Patient's Cardiovascular Status Stable and Respiratory Function Stable  Last Vitals:  Filed Vitals:   07/20/13 1345  BP: 110/69  Pulse:   Temp:   Resp:     Post vital signs: Reviewed and stable  Level of consciousness: awake, alert  and oriented  Complications: No apparent anesthesia complications

## 2013-07-20 NOTE — H&P (Signed)
Susan Boyle is a 23 year old right hand dominant female referred by [facility] for evaluation of an injury she suffered to her left thumb on 06-29-13. She did not seek medical attention initially. She suffered a laceration with a knife over the proximal radial aspect of the left thumb proximal phalanx. She complains of immediate numbness and tingling. She has no prior history of injury. No history of diabetes, thyroid problems, arthritis or gout. There is a family history of arthritis. She complains of an intermittent, moderate, throbbing, aching and burning type pain with a feeling of swelling and numbness. She has been taking nonsteroidal anti-inflammatories.  PAST MEDICAL HISTORY: She is allergic to trimethoprim sulfamethoxazole. She is on Adderall, Ambien and Claritin. She has had an inguinal hernia repair in infancy.   FAMILY H ISTORY: Positive for high BP and arthritis.  SOCIAL HISTORY: She does not smoke. She drinks socially. She is single and C.N.A.  REVIEW OF SYSTEMS:  Positive for glasses, contacts, asthma, depression, otherwise negative for 14 points. Susan Boyle is an 23 y.o. female.   Chief Complaint: laceration left thumb HPI: see above  Past Medical History  Diagnosis Date  . Depression   . Knee pain, chronic   . Back pain, chronic   . Asthma   . Anxiety     Past Surgical History  Procedure Laterality Date  . Hernia repair      inguinal hernia repair as infant    Family History  Problem Relation Age of Onset  . Adopted: Yes  . Heart disease Father     MI   Social History:  reports that she has been smoking Cigarettes.  She has been smoking about 0.00 packs per day. She has never used smokeless tobacco. She reports that she drinks alcohol. She reports that she does not use illicit drugs.  Allergies:  Allergies  Allergen Reactions  . Sulfa Antibiotics     Passes out    No prescriptions prior to admission    No results found for this or any previous  visit (from the past 48 hour(s)).  No results found.   Pertinent items are noted in HPI.  Height 5\' 3"  (1.6 m), weight 61.236 kg (135 lb).  General appearance: alert, cooperative and appears stated age Head: Normocephalic, without obvious abnormality Neck: no JVD Resp: clear to auscultation bilaterally Cardio: regular rate and rhythm, S1, S2 normal, no murmur, click, rub or gallop GI: soft, non-tender; bowel sounds normal; no masses,  no organomegaly Extremities: healed laceration left thumb  Pulses: 2+ and symmetric Skin: Skin color, texture, turgor normal. No rashes or lesions Neurologic: Grossly normal lack of sensation radial left thumb Incision/Wound: healed  Assessment/Plan X-rays are negative.    Diagnosis: Laceration left thumb digital nerve and artery, questionable flexor tendon partial laceration.  We have discussed exploration and repair of artery, nerve and tendon with her. The pre, peri and post op course are discussed along with risks and complications.  She is aware she will not regain any sensation for likely a year. If the flexor tendon needs repair, this will likely take 3 months to heal. If the artery is lacerated this will need to be protected. She is advised of the amount of time for healing and inability to work especially with the flexor tendon. She will be in a splint regardless for at least 3 weeks. She is aware there is no guarantee with surgery, possibility of infection, recurrence, injury to arteries, nerves and tendons, incomplete  relief of symptoms and dystrophy.  She is scheduled for exploration repair arteries, nerves and tendons with placement of a conduit left thumb as an outpatient.  Nicki ReaperGary R Carrington Olazabal 07/20/2013, 9:04 AM

## 2013-07-20 NOTE — Brief Op Note (Signed)
07/20/2013  1:28 PM  PATIENT:  Susan Boyle  23 y.o. female  PRE-OPERATIVE DIAGNOSIS:  LACRATION LEFT THUMB  POST-OPERATIVE DIAGNOSIS:  Laceration Left thumb  PROCEDURE:  Procedure(s): NERVE,  REPAIR (Left)  SURGEON:  Surgeon(s) and Role:    * Nicki Reaper, MD - Primary    * Marlowe Shores, MD - Assisting  PHYSICIAN ASSISTANT:   ASSISTANTS: Alyssa Grove, MD   ANESTHESIA:   regional  EBL:  Total I/O In: 700 [I.V.:700] Out: -   BLOOD ADMINISTERED:none  DRAINS: none   LOCAL MEDICATIONS USED:  NONE  SPECIMEN:  No Specimen  DISPOSITION OF SPECIMEN:  N/A  COUNTS:  YES  TOURNIQUET:   Total Tourniquet Time Documented: Upper Arm (Left) - 42 minutes Total: Upper Arm (Left) - 42 minutes   DICTATION: .Other Dictation: Dictation Number [id number]  PLAN OF CARE: Discharge to home after PACU  PATIENT DISPOSITION:  PACU - hemodynamically stable.

## 2013-07-20 NOTE — Discharge Instructions (Addendum)

## 2013-07-20 NOTE — Anesthesia Preprocedure Evaluation (Addendum)
Anesthesia Evaluation  Patient identified by MRN, date of birth, ID band Patient awake    Reviewed: Allergy & Precautions, H&P , NPO status , Patient's Chart, lab work & pertinent test results  Airway Mallampati: II  Neck ROM: full    Dental   Pulmonary asthma , Current Smoker,          Cardiovascular negative cardio ROS      Neuro/Psych Anxiety Depression    GI/Hepatic   Endo/Other    Renal/GU      Musculoskeletal   Abdominal   Peds  Hematology   Anesthesia Other Findings   Reproductive/Obstetrics                          Anesthesia Physical Anesthesia Plan  ASA: II  Anesthesia Plan: MAC and Regional   Post-op Pain Management: MAC Combined w/ Regional for Post-op pain   Induction: Intravenous  Airway Management Planned: Simple Face Mask  Additional Equipment:   Intra-op Plan:   Post-operative Plan:   Informed Consent: I have reviewed the patients History and Physical, chart, labs and discussed the procedure including the risks, benefits and alternatives for the proposed anesthesia with the patient or authorized representative who has indicated his/her understanding and acceptance.     Plan Discussed with: CRNA, Anesthesiologist and Surgeon  Anesthesia Plan Comments:        Anesthesia Quick Evaluation

## 2013-07-20 NOTE — Anesthesia Procedure Notes (Addendum)
Anesthesia Regional Block:  Supraclavicular block  Pre-Anesthetic Checklist: ,, timeout performed, Correct Patient, Correct Site, Correct Laterality, Correct Procedure, Correct Position, site marked, Risks and benefits discussed,  Surgical consent,  Pre-op evaluation,  At surgeon's request and post-op pain management  Laterality: Left  Prep: chloraprep       Needles:  Injection technique: Single-shot  Needle Type: Echogenic Stimulator Needle     Needle Length: 5cm 5 cm Needle Gauge: 22 and 22 G    Additional Needles:  Procedures: ultrasound guided (picture in chart) and nerve stimulator Supraclavicular block  Nerve Stimulator or Paresthesia:  Response: biceps flexion, 0.45 mA,   Additional Responses:   Narrative:  Start time: 07/20/2013 12:02 PM End time: 07/20/2013 12:13 PM Injection made incrementally with aspirations every 5 mL.  Performed by: Personally  Anesthesiologist: Dr Chaney Malling  Additional Notes: Functioning IV was confirmed and monitors were applied.  A 61mm 22ga Arrow echogenic stimulator needle was used. Sterile prep and drape,hand hygiene and sterile gloves were used.  Negative aspiration and negative test dose prior to incremental administration of local anesthetic. The patient tolerated the procedure well.  Ultrasound guidance: relevent anatomy identified, needle position confirmed, local anesthetic spread visualized around nerve(s), vascular puncture avoided.  Image printed for medical record.    Procedure Name: MAC Date/Time: 07/20/2013 12:35 PM Performed by: Curly Shores Pre-anesthesia Checklist: Patient identified Patient Re-evaluated:Patient Re-evaluated prior to inductionOxygen Delivery Method: Simple face mask Preoxygenation: Pre-oxygenation with 100% oxygen

## 2013-07-20 NOTE — Transfer of Care (Signed)
Immediate Anesthesia Transfer of Care Note  Patient: Susan Boyle  Procedure(s) Performed: Procedure(s): NERVE,  REPAIR (Left)  Patient Location: PACU  Anesthesia Type:MAC and Regional  Level of Consciousness: awake, alert  and oriented  Airway & Oxygen Therapy: Patient Spontanous Breathing, RA SaO2 99%  Post-op Assessment: Report given to PACU RN, Post -op Vital signs reviewed and stable and Patient moving all extremities  Post vital signs: Reviewed and stable  Complications: No apparent anesthesia complications

## 2013-07-20 NOTE — Progress Notes (Signed)
Assisted Dr. Hodierne with left, ultrasound guided, supraclavicular block. Side rails up, monitors on throughout procedure. See vital signs in flow sheet. Tolerated Procedure well. 

## 2013-07-20 NOTE — Op Note (Signed)
Other Dictation: Dictation Number [id number]

## 2013-07-21 ENCOUNTER — Encounter (HOSPITAL_BASED_OUTPATIENT_CLINIC_OR_DEPARTMENT_OTHER): Payer: Self-pay | Admitting: Orthopedic Surgery

## 2013-07-21 NOTE — Op Note (Signed)
NAMEMONTASIA, KLOSE NO.:  [id number]  MEDICAL RECORD NO.:  [id number]  LOCATION:                               FACILITY:  [facility]  PHYSICIAN:  Cindee Salt, M.D.       DATE OF BIRTH:  October 11, 1990  DATE OF PROCEDURE:  07/20/2013 DATE OF DISCHARGE:  07/20/2013                              OPERATIVE REPORT   PREOPERATIVE DIAGNOSIS:  Laceration left thumb, digital artery tendon nerve.  POSTOPERATIVE DIAGNOSIS:  Laceration left thumb, digital artery tendon nerve.  OPERATION:  Exploration wound with repair of digital artery radial side left thumb.  Coagulation of the digital artery and partial laceration of flexor pollicis longus, operation repair digital artery with conduit.  SURGEON:  Cindee Salt, M.D.  ASSISTANT:  Artist Pais. Mina Marble, M.D.  ANESTHESIA:  Supraclavicular block.  ANESTHESIOLOGIST:  Achille Rich, MD  HISTORY:  The patient is a 23 year old female, who suffered a laceration to her left thumb, this was approximately 3 weeks ago.  She is admitted with numbness and tingling to the radial side of her thumb, mild pain to flexion of her FPL tendon.  No prior history of injuries.  She is aware of risks and complications including infection; recurrence of injury to arteries, nerves, tendons, incomplete relief of symptoms, dystrophy.  In preoperative area, the patient is seen, the extremity marked by both patient and surgeon.  Antibiotic given.  PROCEDURE IN DETAIL:  The patient was brought to the operating room, where a supraclavicular block was carried out and having been performed in the preoperative area.  She was prepped in supine position with the left arm free with ChloraPrep.  The limb was exsanguinated, after time- out with an Esmarch bandage and the tourniquet inflated to 250 mmHg.  A time-out confirmed the patient and procedure and extremity.  The wound was extended proximally and distally.  A volar Brunner type incision carried only to the mid  thumb carried down through subcutaneous tissue. The laceration was immediately apparent.  This was followed distally. There was a defect in the flexor sheath.  This allowed the flexor sheath to be open just distal to the oblique pulley attachment and a 10% laceration to the flexor pollicis longus tendon was noted.  This was further debrided and that it had already begun to heal.  The digital artery was isolated from the digital nerve.  This was extremely small, attempted repair, was able pass a suture through the lumen the needle taking up the entire lumen space.  The tissue was so friable, it tore, was decided not to attempt further repair, this was then coagulated. The digital nerve was transected back to normal fascicles proximally with a neural informing.  Normal fascicles distally.  This measured approximately 1.5-2 mm in diameter.  A 2 mm Exogen conduit was then selected.  This was soaked.  It was then sutured into position with horizontal mattress 9-0 nylon sutures proximally and distally.  The conduit was then filled with saline.  The skin closed with interrupted 5-0 Vicryl Rapide sutures.  A sterile compressive dressing and thumb spica splint applied.  On deflation of the tourniquet, all fingers immediately pinked.  She was taken  to the recovery room for observation in satisfactory condition.  She will be discharged home to return in 1 week on Vicodin.          ______________________________ Cindee SaltGary Nasreen Goedecke, M.D.     GK/MEDQ  D:  07/20/2013  T:  07/21/2013  Job:  [id number]

## 2013-07-27 ENCOUNTER — Encounter (HOSPITAL_BASED_OUTPATIENT_CLINIC_OR_DEPARTMENT_OTHER): Payer: Self-pay | Admitting: Orthopedic Surgery

## 2014-04-10 ENCOUNTER — Ambulatory Visit: Payer: Self-pay | Admitting: Family

## 2015-08-15 ENCOUNTER — Ambulatory Visit (INDEPENDENT_AMBULATORY_CARE_PROVIDER_SITE_OTHER): Payer: Self-pay | Admitting: Family Medicine

## 2015-08-15 ENCOUNTER — Other Ambulatory Visit: Payer: Self-pay

## 2015-08-15 ENCOUNTER — Encounter: Payer: Self-pay | Admitting: Family Medicine

## 2015-08-15 VITALS — BP 112/78 | HR 65 | Ht 63.0 in | Wt 134.0 lb

## 2015-08-15 DIAGNOSIS — M7550 Bursitis of unspecified shoulder: Secondary | ICD-10-CM | POA: Insufficient documentation

## 2015-08-15 DIAGNOSIS — M25511 Pain in right shoulder: Secondary | ICD-10-CM

## 2015-08-15 DIAGNOSIS — M7551 Bursitis of right shoulder: Secondary | ICD-10-CM
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
## 2015-08-15 NOTE — Patient Instructions (Signed)
Good to see you Ice 20 minutes 2 times daily. Usually after activity and before bed. Exercises 3 times a week.  On wall with heels, butt shoulder and head touching for a goal of 5 minutes daily  Monitor at eye level Keep a tennisball between shoulder blades with studying. Maybe even in the car.  pennsaid pinkie amount topically 2 times daily as needed.  Avoid activities where you cannot see your hands within your peripheral vision.  See me again in 6 weeks and may try manipulation.

## 2015-08-15 NOTE — Progress Notes (Signed)
Pre visit review using our clinic review tool, if applicable. No additional management support is needed unless otherwise documented below in the visit note. 

## 2015-08-15 NOTE — Assessment & Plan Note (Signed)
Patient does have more of a subacromial bursitis. Mild rotator cuff tendinopathy but no true tearing appreciated. We discussed with patient at great length. We discussed icing regimen, and given trial topical anti-inflammatory's, we discussed which activities to do an which was to avoid. Work with Event organiserathletic trainer to learn home exercises in greater detail. Discussed ergonomics and posture. Patient and will come back and see me again in 6 weeks. She could be a candidate for manipulation.

## 2015-09-25 NOTE — Progress Notes (Signed)
[facility] [address] [place], [place] [address] Phone: [phone] Subjective:     CC: Right shoulder pain f/u   [id number]  Susan Boyle is a 25 y.o. female coming in with complaint of right shoulder pain. Patient discusses pain as a dull, throbbing aching sensation.  Found to have bursitis and poor posture at last exam.  Patient elected for conservative therapy.  Patient states Shoulder is 99% better. Not having any significant pain. Feels that the posture has been very beneficial. Patient is having more right elbow pain. Describes dull, throbbing aching sensation. Not severe overall. We discussed icing regimen. Discussed home exercises.     Past Medical History:  Diagnosis Date  . Anxiety   . Asthma   . Back pain, chronic   . Depression   . Knee pain, chronic    Past Surgical History:  Procedure Laterality Date  . HERNIA REPAIR     inguinal hernia repair as infant  . NERVE, TENDON AND ARTERY REPAIR Left 07/20/2013   Procedure: NERVE,  REPAIR;  Surgeon: Nicki Reaper, MD;  Location: [facility];  Service: Orthopedics;  Laterality: Left;   Social History   Social History  . Marital status: Single    Spouse name: N/A  . Number of children: N/A  . Years of education: N/A   Social History Main Topics  . Smoking status: Current Every Day Smoker    Types: Cigarettes  . Smokeless tobacco: Never Used  . Alcohol use Yes     Comment: social  . Drug use: No  . Sexual activity: Yes    Birth control/ protection: IUD   Other Topics Concern  . Not on file   Social History Narrative  . No narrative on file   Allergies  Allergen Reactions  . Sulfa Antibiotics     Passes out   Family History  Problem Relation Age of Onset  . Adopted: Yes  . Heart disease Father     MI    Past medical history, social, surgical and family history all reviewed in electronic medical record.  No pertanent information unless stated  regarding to the chief complaint.   Review of Systems: No headache, visual changes, nausea, vomiting, diarrhea, constipation, dizziness, abdominal pain, skin rash, fevers, chills, night sweats, weight loss, swollen lymph nodes, body aches, joint swelling, muscle aches, chest pain, shortness of breath, mood changes.   Objective  There were no vitals taken for this visit.  General: No apparent distress alert and oriented x3 mood and affect normal, dressed appropriately.  HEENT: Pupils equal, extraocular movements intact  Respiratory: Patient's speak in full sentences and does not appear short of breath  Cardiovascular: No lower extremity edema, non tender, no erythema  Skin: Warm dry intact with no signs of infection or rash on extremities or on axial skeleton.  Abdomen: Soft nontender  Neuro: Cranial nerves II through XII are intact, neurovascularly intact in all extremities with 2+ DTRs and 2+ pulses.  Lymph: No lymphadenopathy of posterior or anterior cervical chain or axillae bilaterally.  Gait normal with good balance and coordination.  MSK:  Non tender with full range of motion and good stability and symmetric strength and tone of ,  wrist, hip, knee and ankles bilaterally.  Shoulder: Right Inspection reveals no abnormalities, atrophy or asymmetry. Palpation is normal with no tenderness over AC joint or bicipital groove. ROM is full in all planes passively. Rotator cuff strength normal throughout. Negative  impingement Speeds and Yergason's tests normal. No labral pathology noted with negative Obrien's, negative clunk and good stability. Normal scapular function observed. No painful arc and no drop arm sign. No apprehension sign  Elbow: Right Unremarkable to inspection. Range of motion full pronation, supination, flexion, extension. Strength is full to all of the above directions Stable to varus, valgus stress. Negative moving valgus stress test. Pain over the lateral epicondylar  region especially with resisted wrist extension Ulnar nerve does not sublux. Negative cubital tunnel Tinel's. Contralateral elbow unremarkable      Impression and Recommendations:     This case required medical decision making of moderate complexity.      Note: This dictation was prepared with Dragon dictation along with smaller phrase technology. Any transcriptional errors that result from this process are unintentional.

## 2015-09-26 ENCOUNTER — Encounter: Payer: Self-pay | Admitting: Family Medicine

## 2015-09-26 ENCOUNTER — Ambulatory Visit (INDEPENDENT_AMBULATORY_CARE_PROVIDER_SITE_OTHER): Payer: Self-pay | Admitting: Family Medicine

## 2015-09-26 DIAGNOSIS — M7551 Bursitis of right shoulder: Secondary | ICD-10-CM

## 2015-09-26 DIAGNOSIS — M7711 Lateral epicondylitis, right elbow: Secondary | ICD-10-CM

## 2015-09-26 NOTE — Assessment & Plan Note (Signed)
Seems to have resolved at this time. We'll continue to monitor closely. We discussed icing regimen. Patient will come back and see me again in 4 weeks if pain doesn't resolve completely

## 2015-09-26 NOTE — Assessment & Plan Note (Signed)
Plan   Lateral Epicondylitis: Elbow anatomy was reviewed, and tendinopathy was explained.  Pt. given a formal rehab program. Series of concentric and eccentric exercises should be done starting with no weight, work up to 1 lb, hammer, etc.  Use counterforce strap if working or using hands.  Formal PT would be beneficial. Emphasized stretching an cross-friction massage Emphasized proper palms up lifting biomechanics to unload ECRB RTC in 4 weeks.

## 2015-09-26 NOTE — Patient Instructions (Signed)
Good to see you  Ice is your friend Wear brace day and night for 1 week then nightly for 2 weeks pennsaid pinkie amount topically 2 times daily as needed.  Exercises 3 times a week.  Stay active.  Do not lift overhand.  Try to pretend a Pharmacologist while typing.

## 2015-11-08 ENCOUNTER — Ambulatory Visit: Payer: Self-pay | Admitting: Family Medicine

## 2016-02-22 ENCOUNTER — Emergency Department (HOSPITAL_COMMUNITY)
Admission: EM | Admit: 2016-02-22 | Discharge: 2016-02-22 | Disposition: A | Discharge status: Home / Self Care | Payer: Self-pay | Attending: Emergency Medicine | Admitting: Emergency Medicine

## 2016-02-22 DIAGNOSIS — J45909 Unspecified asthma, uncomplicated: Secondary | ICD-10-CM | POA: Insufficient documentation

## 2016-02-22 DIAGNOSIS — M545 Low back pain, unspecified: Secondary | ICD-10-CM

## 2016-02-22 DIAGNOSIS — F17210 Nicotine dependence, cigarettes, uncomplicated: Secondary | ICD-10-CM | POA: Insufficient documentation

## 2016-02-22 DIAGNOSIS — R519 Headache, unspecified: Secondary | ICD-10-CM

## 2016-02-22 DIAGNOSIS — Y92410 Unspecified street and highway as the place of occurrence of the external cause: Secondary | ICD-10-CM | POA: Insufficient documentation

## 2016-02-22 DIAGNOSIS — Y939 Activity, unspecified: Secondary | ICD-10-CM | POA: Insufficient documentation

## 2016-02-22 DIAGNOSIS — R51 Headache: Secondary | ICD-10-CM | POA: Insufficient documentation

## 2016-02-22 DIAGNOSIS — Y999 Unspecified external cause status: Secondary | ICD-10-CM | POA: Insufficient documentation

## 2016-02-22 DIAGNOSIS — Z79899 Other long term (current) drug therapy: Secondary | ICD-10-CM | POA: Insufficient documentation

## 2016-02-22 MED ORDER — IBUPROFEN 600 MG PO TABS: 600.0000 mg | ORAL_TABLET | Freq: Three times a day (TID) | ORAL | 0 refills | Status: DC

## 2016-02-22 MED ORDER — KETOROLAC TROMETHAMINE 60 MG/2ML IM SOLN
60.0000 mg | Freq: Once | INTRAMUSCULAR | Status: AC
  Administered 2016-02-22: 60 mg via INTRAMUSCULAR
  Filled 2016-02-22: qty 2

## 2016-02-22 MED ORDER — METHOCARBAMOL 500 MG PO TABS
1000.0000 mg | ORAL_TABLET | Freq: Once | ORAL | Status: AC
  Administered 2016-02-22: 1000 mg via ORAL
  Filled 2016-02-22: qty 2

## 2016-02-22 MED ORDER — METHOCARBAMOL 500 MG PO TABS
500.0000 mg | ORAL_TABLET | Freq: Every evening | ORAL | 0 refills | Status: DC | PRN
Start: 2016-02-22 — End: 2020-05-02

## 2016-02-22 NOTE — ED Notes (Signed)
Patient was alert, oriented and stable upon discharge. RN went over AVS and patient had no further questions.  
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
## 2016-02-22 NOTE — Discharge Instructions (Signed)
Take anti-inflammatory medicines for the next week. Take this medicine with food. °Take muscle relaxer at bedtime to help you sleep. This medicine makes you drowsy so do not take before driving or work °Use a heating pad for sore muscles - use for 20 minutes several times a day ° °

## 2019-12-07 ENCOUNTER — Other Ambulatory Visit (HOSPITAL_COMMUNITY): Payer: Self-pay | Admitting: Specialist

## 2020-01-02 ENCOUNTER — Ambulatory Visit: Payer: Self-pay

## 2020-02-13 ENCOUNTER — Other Ambulatory Visit: Payer: Self-pay

## 2020-02-13 ENCOUNTER — Other Ambulatory Visit: Payer: Self-pay | Admitting: Occupational Medicine

## 2020-02-13 ENCOUNTER — Ambulatory Visit: Payer: Self-pay

## 2020-02-13 DIAGNOSIS — M25531 Pain in right wrist: Secondary | ICD-10-CM

## 2020-03-28 MED FILL — ATOMOXETINE HCL 60 MG CAPS: 60 | 30 days supply | Qty: 30 | Fill #0

## 2020-04-03 ENCOUNTER — Telehealth: Payer: Self-pay | Admitting: Physician Assistant

## 2020-04-03 ENCOUNTER — Other Ambulatory Visit: Payer: Self-pay | Admitting: Physician Assistant

## 2020-04-03 DIAGNOSIS — R21 Rash and other nonspecific skin eruption: Secondary | ICD-10-CM

## 2020-04-03 MED ORDER — CLOTRIMAZOLE-BETAMETHASONE 1-0.05 % EX CREA: 1.0000 "application " | TOPICAL_CREAM | Freq: Two times a day (BID) | CUTANEOUS | 0 refills | Status: DC

## 2020-04-03 MED FILL — CLOTRIMAZOLE-BETAMETHASONE: 1-0.05 | 15 days supply | Qty: 30 | Fill #0

## 2020-04-03 NOTE — Progress Notes (Signed)
E Visit for Rash  We are sorry that you are not feeling well. Here is how we plan to help!  Based upon your presentation it appears you have a fungal infection.  I have prescribed: clotrimazole-betamethasone combination cream to apply twice daily for the next week. If not improving you need an in-person evaluation.    HOME CARE:   Take cool showers and avoid direct sunlight.  Apply cool compress or wet dressings.  Take a bath in an oatmeal bath.  Sprinkle content of one Aveeno packet under running faucet with comfortably warm water.  Bathe for 15-20 minutes, 1-2 times daily.  Pat dry with a towel. Do not rub the rash.  Use hydrocortisone cream.  Take an antihistamine like Benadryl for widespread rashes that itch.  The adult dose of Benadryl is 25-50 mg by mouth 4 times daily.  Caution:  This type of medication may cause sleepiness.  Do not drink alcohol, drive, or operate dangerous machinery while taking antihistamines.  Do not take these medications if you have prostate enlargement.  Read package instructions thoroughly on all medications that you take.  GET HELP RIGHT AWAY IF:   Symptoms don't go away after treatment.  Severe itching that persists.  If you rash spreads or swells.  If you rash begins to smell.  If it blisters and opens or develops a yellow-brown crust.  You develop a fever.  You have a sore throat.  You become short of breath.  MAKE SURE YOU:  Understand these instructions. Will watch your condition. Will get help right away if you are not doing well or get worse.  Thank you for choosing an e-visit. Your e-visit answers were reviewed by a board certified advanced clinical practitioner to complete your personal care plan. Depending upon the condition, your plan could have included both over the counter or prescription medications. Please review your pharmacy choice. Be sure that the pharmacy you have chosen is open so that you can pick up your  prescription now.  If there is a problem you may message your provider in MyChart to have the prescription routed to another pharmacy. Your safety is important to [place]. If you have drug allergies check your prescription carefully.  For the next 24 hours, you can use MyChart to ask questions about today's visit, request a non-urgent call back, or ask for a work or school excuse from your e-visit provider. You will get an email in the next two days asking about your experience. I hope that your e-visit has been valuable and will speed your recovery.

## 2020-04-03 NOTE — Progress Notes (Signed)
I have spent 5 minutes in review of e-visit questionnaire, review and updating patient chart, medical decision making and response to patient.   Mikia Delaluz Cody Nashalie Sallis, PA-C    

## 2020-04-04 DIAGNOSIS — F4310 Post-traumatic stress disorder, unspecified: Secondary | ICD-10-CM | POA: Diagnosis not present

## 2020-04-04 DIAGNOSIS — F900 Attention-deficit hyperactivity disorder, predominantly inattentive type: Secondary | ICD-10-CM | POA: Diagnosis not present

## 2020-04-09 ENCOUNTER — Other Ambulatory Visit (HOSPITAL_COMMUNITY): Payer: Self-pay | Admitting: Specialist

## 2020-04-10 MED FILL — ONDANSETRON ODT 8 MG TABLET: 8 | 30 days supply | Qty: 60 | Fill #0

## 2020-04-30 ENCOUNTER — Other Ambulatory Visit (HOSPITAL_COMMUNITY): Payer: Self-pay | Admitting: Optometry

## 2020-05-01 ENCOUNTER — Other Ambulatory Visit (HOSPITAL_COMMUNITY): Payer: Self-pay | Admitting: Specialist

## 2020-05-01 MED FILL — NEO/POLYMYXIN/DEXAMETH DROP: 3.5-10000-0 | 9 days supply | Qty: 5 | Fill #0

## 2020-05-02 ENCOUNTER — Other Ambulatory Visit: Payer: Self-pay | Admitting: Family Medicine

## 2020-05-02 ENCOUNTER — Ambulatory Visit: Payer: Self-pay | Admitting: Family Medicine

## 2020-05-02 ENCOUNTER — Encounter: Payer: Self-pay | Admitting: Family Medicine

## 2020-05-02 ENCOUNTER — Other Ambulatory Visit: Payer: Self-pay

## 2020-05-02 ENCOUNTER — Ambulatory Visit (INDEPENDENT_AMBULATORY_CARE_PROVIDER_SITE_OTHER): Payer: 59 | Admitting: Family Medicine

## 2020-05-02 VITALS — BP 112/74 | HR 85 | Temp 97.9°F | Ht 62.75 in | Wt 167.6 lb

## 2020-05-02 DIAGNOSIS — J4520 Mild intermittent asthma, uncomplicated: Secondary | ICD-10-CM | POA: Diagnosis not present

## 2020-05-02 DIAGNOSIS — L503 Dermatographic urticaria: Secondary | ICD-10-CM | POA: Diagnosis not present

## 2020-05-02 DIAGNOSIS — Z124 Encounter for screening for malignant neoplasm of cervix: Secondary | ICD-10-CM | POA: Diagnosis not present

## 2020-05-02 DIAGNOSIS — Z13220 Encounter for screening for lipoid disorders: Secondary | ICD-10-CM

## 2020-05-02 DIAGNOSIS — Z1329 Encounter for screening for other suspected endocrine disorder: Secondary | ICD-10-CM | POA: Diagnosis not present

## 2020-05-02 DIAGNOSIS — Z0000 Encounter for general adult medical examination without abnormal findings: Secondary | ICD-10-CM

## 2020-05-02 DIAGNOSIS — Z91018 Allergy to other foods: Secondary | ICD-10-CM | POA: Insufficient documentation

## 2020-05-02 DIAGNOSIS — J45909 Unspecified asthma, uncomplicated: Secondary | ICD-10-CM | POA: Insufficient documentation

## 2020-05-02 DIAGNOSIS — Z9109 Other allergy status, other than to drugs and biological substances: Secondary | ICD-10-CM | POA: Insufficient documentation

## 2020-05-02 LAB — POCT URINALYSIS DIP (PROADVANTAGE DEVICE)
Bilirubin, UA: NEGATIVE
Blood, UA: NEGATIVE
Glucose, UA: NEGATIVE mg/dL
Ketones, POC UA: NEGATIVE mg/dL
Leukocytes, UA: NEGATIVE
Nitrite, UA: NEGATIVE
Protein Ur, POC: NEGATIVE mg/dL
Specific Gravity, Urine: 1.01
Urobilinogen, Ur: 0.2
pH, UA: 6 (ref 5.0–8.0)

## 2020-05-02 MED ORDER — ADVAIR HFA 230-21 MCG/ACT IN AERO: 2.0000 | INHALATION_SPRAY | Freq: Two times a day (BID) | RESPIRATORY_TRACT | 2 refills | Status: DC

## 2020-05-02 NOTE — Progress Notes (Signed)
Subjective:    Patient ID: Susan Boyle, female    DOB: [birth year], 30 y.o.   MRN: [id number]  HPI Chief Complaint  Patient presents with  . Annual Exam    Non fasting   . Establish Care   She is new to the practice and here for a complete physical exam. Previous medical care: [facility]   Other providers: Psychiatrist- Dr. Karenann Cai- Dr. Merlyn Lot   Skin changes behind her ears. She saw virtual doctors for this. States she uses triamcinolone and Eucerin. Denies flare up today.   Sees psychiatrist for PTSD and ADHD.   States she has had a severe allergic reaction requiring hospitalization to something in a can of Campbell's crab and corn chowder soup. Only this brand and not others.  States she has eaten shellfish since then and no issues. Request referral to allergist.  She also reports dermatitis from her belt buckle at times.   Asthma- has not needed albuterol inhaler lately. Needs it during cold temps, exercise  Uses preventive inhaler only when it is cold.   Social history: Lives with significant other, no kids, works as Associate Professor with [employer]  Diet: unhealthy diet but transitioning to a healthier one Excerise: none lately   Immunizations: [facility]   Health maintenance:  Last Gynecological Exam: years ago  Last Menstrual cycle: Mirena IUD  Pregnancies: 0 Last Dental Exam: years ago  Last Eye Exam: 2 days ago   Wears seatbelt always, uses sunscreen, smoke detectors in home and functioning, does not text while driving and feels safe in home environment.   Reviewed allergies, medications, past medical, surgical, family, and social history.    Review of Systems Review of Systems Constitutional: -fever, -chills, -sweats, -unexpected weight change,-fatigue ENT: -runny nose, -ear pain, -sore throat Cardiology:  -chest pain, -palpitations, -edema Respiratory: -cough, -shortness of breath, -wheezing Gastroenterology: -abdominal pain, -nausea, -vomiting,  -diarrhea, -constipation  Hematology: -bleeding or bruising problems Musculoskeletal: -arthralgias, -myalgias, -joint swelling, -back pain Ophthalmology: -vision changes Urology: -dysuria, -difficulty urinating, -hematuria, -urinary frequency, -urgency Neurology: -headache, -weakness, -tingling, -numbness       Objective:   Physical Exam BP 112/74   Pulse 85   Temp 97.9 F (36.6 C)   Ht 5' 2.75" (1.594 m)   Wt 167 lb 9.6 oz (76 kg)   SpO2 99%   BMI 29.93 kg/m   General Appearance:    Alert, cooperative, no distress, appears stated age  Head:    Normocephalic, without obvious abnormality, atraumatic  Eyes:    PERRL, conjunctiva/corneas clear, EOM's intact  Ears:    Normal TM's and external ear canals  Nose:   Mask on   Throat:   Mask on   Neck:   Supple, no lymphadenopathy;  thyroid:  no   enlargement/tenderness/nodules; no VD  Back:    Spine nontender, no curvature, ROM normal, no CVA     tenderness  Lungs:     Clear to auscultation bilaterally without wheezes, rales or     ronchi; respirations unlabored  Chest Wall:    No tenderness or deformity   Heart:    Regular rate and rhythm, S1 and S2 normal, no murmur, rub   or gallop  Breast Exam:    GYN  Abdomen:     Soft, non-tender, nondistended, normoactive bowel sounds,    no masses, no hepatosplenomegaly  Genitalia:    Gynecologist   Rectal:    Not performed due to age<40 and no related complaints  Extremities:   No clubbing, cyanosis or edema  Pulses:   2+ and symmetric all extremities  Skin:   Skin color, texture, turgor normal, no rashes or lesions  Lymph nodes:   Cervical, supraclavicular, and axillary nodes normal  Neurologic:   CNII-XII intact, normal strength, sensation and gait; reflexes 2+ and symmetric throughout          Psych:   Normal mood, affect, hygiene and grooming.        Assessment & Plan:  Routine general medical examination at a health care facility - Plan: CBC with Differential/Platelet,  Comprehensive metabolic panel, TSH, T4, free, Lipid panel, POCT Urinalysis DIP (Proadvantage Device) -She is a pleasant 30 year old female who is new to the practice and here today for a CPE. Preventive health care reviewed.  She is overdue for her Pap smear and requests a referral to a gynecologist, not an OB/GYN office.  She does not plan on having children.  Recommend regular dental and eye exams.  Counseling on healthy lifestyle including diet and exercise.  Immunizations per [facility] health.  Discussed safety and health promotion.  Screening for cervical cancer - Plan: Ambulatory referral to Gynecology -Referral to gynecology per patient request.  Mild intermittent asthma without complication - Plan: fluticasone-salmeterol (ADVAIR HFA) 230-21 MCG/ACT inhaler, Ambulatory referral to Allergy -Currently controlled.  Referral to allergy and asthma.  Food allergy - Plan: Ambulatory referral to Allergy -Unclear allergy but she feels that this has only occurred when she has a particular can of soup.  She will avoid this for now obviously.  Environmental allergies - Plan: Ambulatory referral to Allergy  Screening for thyroid disorder - Plan: TSH, T4, free  Screening for lipid disorders - Plan: Lipid panel  Dermatographism

## 2020-05-02 NOTE — Patient Instructions (Addendum)
You should hear from the gynecologist office and Allergy and Asthma. If you have not hear from either in 2 weeks, let me know.     Preventive Care 53-31 Years Old, Female Preventive care refers to lifestyle choices and visits with your health care provider that can promote health and wellness. This includes:  A yearly physical exam. This is also called an annual wellness visit.  Regular dental and eye exams.  Immunizations.  Screening for certain conditions.  Healthy lifestyle choices, such as: ? Eating a healthy diet. ? Getting regular exercise. ? Not using drugs or products that contain nicotine and tobacco. ? Limiting alcohol use. What can I expect for my preventive care visit? Physical exam Your health care provider may check your:  Height and weight. These may be used to calculate your BMI (body mass index). BMI is a measurement that tells if you are at a healthy weight.  Heart rate and blood pressure.  Body temperature.  Skin for abnormal spots. Counseling Your health care provider may ask you questions about your:  Past medical problems.  Family's medical history.  Alcohol, tobacco, and drug use.  Emotional well-being.  Home life and relationship well-being.  Sexual activity.  Diet, exercise, and sleep habits.  Work and work Statistician.  Access to firearms.  Method of birth control.  Menstrual cycle.  Pregnancy history. What immunizations do I need? Vaccines are usually given at various ages, according to a schedule. Your health care provider will recommend vaccines for you based on your age, medical history, and lifestyle or other factors, such as travel or where you work.   What tests do I need? Blood tests  Lipid and cholesterol levels. These may be checked every 5 years starting at age 50.  Hepatitis C test.  Hepatitis B test. Screening  Diabetes screening. This is done by checking your blood sugar (glucose) after you have not eaten for  a while (fasting).  STD (sexually transmitted disease) testing, if you are at risk.  BRCA-related cancer screening. This may be done if you have a family history of breast, ovarian, tubal, or peritoneal cancers.  Pelvic exam and Pap test. This may be done every 3 years starting at age 66. Starting at age 72, this may be done every 5 years if you have a Pap test in combination with an HPV test. Talk with your health care provider about your test results, treatment options, and if necessary, the need for more tests.   Follow these instructions at home: Eating and drinking  Eat a healthy diet that includes fresh fruits and vegetables, whole grains, lean protein, and low-fat dairy products.  Take vitamin and mineral supplements as recommended by your health care provider.  Do not drink alcohol if: ? Your health care provider tells you not to drink. ? You are pregnant, may be pregnant, or are planning to become pregnant.  If you drink alcohol: ? Limit how much you have to 0-1 drink a day. ? Be aware of how much alcohol is in your drink. In the [place] one drink equals one 12 oz bottle of beer (355 mL), one 5 oz glass of wine (148 mL), or one 1 oz glass of hard liquor (44 mL).   Lifestyle  Take daily care of your teeth and gums. Brush your teeth every morning and night with fluoride toothpaste. Floss one time each day.  Stay active. Exercise for at least 30 minutes 5 or more days each week.  Do not  use any products that contain nicotine or tobacco, such as cigarettes, e-cigarettes, and chewing tobacco. If you need help quitting, ask your health care provider.  Do not use drugs.  If you are sexually active, practice safe sex. Use a condom or other form of protection to prevent STIs (sexually transmitted infections).  If you do not wish to become pregnant, use a form of birth control. If you plan to become pregnant, see your health care provider for a prepregnancy visit.  Find healthy ways  to cope with stress, such as: ? Meditation, yoga, or listening to music. ? Journaling. ? Talking to a trusted person. ? Spending time with friends and family. Safety  Always wear your seat belt while driving or riding in a vehicle.  Do not drive: ? If you have been drinking alcohol. Do not ride with someone who has been drinking. ? When you are tired or distracted. ? While texting.  Wear a helmet and other protective equipment during sports activities.  If you have firearms in your house, make sure you follow all gun safety procedures.  Seek help if you have been physically or sexually abused. What's next?  Go to your health care provider once a year for an annual wellness visit.  Ask your health care provider how often you should have your eyes and teeth checked.  Stay up to date on all vaccines. This information is not intended to replace advice given to you by your health care provider. Make sure you discuss any questions you have with your health care provider. Document Revised: 10/09/2019 Document Reviewed: 10/22/2017 Elsevier Patient Education  2021 [employer].

## 2020-05-03 LAB — COMPREHENSIVE METABOLIC PANEL
ALT: 42 IU/L — ABNORMAL HIGH (ref 0–32)
AST: 33 IU/L (ref 0–40)
Albumin/Globulin Ratio: 2.4 — ABNORMAL HIGH (ref 1.2–2.2)
Albumin: 5 g/dL (ref 3.9–5.0)
Alkaline Phosphatase: 55 IU/L (ref 44–121)
BUN/Creatinine Ratio: 22 (ref 9–23)
BUN: 16 mg/dL (ref 6–20)
Bilirubin Total: 0.3 mg/dL (ref 0.0–1.2)
CO2: 22 mmol/L (ref 20–29)
Calcium: 9.9 mg/dL (ref 8.7–10.2)
Chloride: 98 mmol/L (ref 96–106)
Creatinine, Ser: 0.74 mg/dL (ref 0.57–1.00)
Globulin, Total: 2.1 g/dL (ref 1.5–4.5)
Glucose: 98 mg/dL (ref 65–99)
Potassium: 3.9 mmol/L (ref 3.5–5.2)
Sodium: 138 mmol/L (ref 134–144)
Total Protein: 7.1 g/dL (ref 6.0–8.5)
eGFR: 112 mL/min/{1.73_m2} (ref >59)

## 2020-05-03 LAB — CBC WITH DIFFERENTIAL/PLATELET
Basophils Absolute: 0 10*3/uL (ref 0.0–0.2)
Basos: 0 %
EOS (ABSOLUTE): 0.3 10*3/uL (ref 0.0–0.4)
Eos: 5 %
Hematocrit: 36.6 % (ref 34.0–46.6)
Hemoglobin: 12.3 g/dL (ref 11.1–15.9)
Immature Grans (Abs): 0 10*3/uL (ref 0.0–0.1)
Immature Granulocytes: 0 %
Lymphocytes Absolute: 2.1 10*3/uL (ref 0.7–3.1)
Lymphs: 36 %
MCH: 30.2 pg (ref 26.6–33.0)
MCHC: 33.6 g/dL (ref 31.5–35.7)
MCV: 90 fL (ref 79–97)
Monocytes Absolute: 0.4 10*3/uL (ref 0.1–0.9)
Monocytes: 7 %
Neutrophils Absolute: 3.1 10*3/uL (ref 1.4–7.0)
Neutrophils: 52 %
Platelets: 205 10*3/uL (ref 150–450)
RBC: 4.07 x10E6/uL (ref 3.77–5.28)
RDW: 12.6 % (ref 11.7–15.4)
WBC: 5.9 10*3/uL (ref 3.4–10.8)

## 2020-05-03 LAB — TSH: TSH: 2.13 u[IU]/mL (ref 0.450–4.500)

## 2020-05-03 LAB — LIPID PANEL
Chol/HDL Ratio: 2.4 ratio (ref 0.0–4.4)
Cholesterol, Total: 156 mg/dL (ref 100–199)
HDL: 66 mg/dL (ref >39)
LDL Chol Calc (NIH): 74 mg/dL (ref 0–99)
Triglycerides: 87 mg/dL (ref 0–149)
VLDL Cholesterol Cal: 16 mg/dL (ref 5–40)

## 2020-05-03 LAB — T4, FREE: Free T4: 1.15 ng/dL (ref 0.82–1.77)

## 2020-05-03 NOTE — Progress Notes (Signed)
Please schedule her for a lab visit in 4 weeks to recheck ALT (CMP) for elevated liver enzymes.

## 2020-05-04 ENCOUNTER — Telehealth: Payer: Self-pay

## 2020-05-04 NOTE — Telephone Encounter (Signed)
Pharmacy stated Diskus has a different strength and directions. A new script would have to be sent. This is to replace the Advair HFA to Advair Diskus due to priced difference.

## 2020-05-06 ENCOUNTER — Encounter (INDEPENDENT_AMBULATORY_CARE_PROVIDER_SITE_OTHER): Payer: Self-pay

## 2020-05-06 NOTE — Telephone Encounter (Signed)
Please assist her with the medication.

## 2020-05-07 ENCOUNTER — Other Ambulatory Visit (HOSPITAL_COMMUNITY): Payer: Self-pay | Admitting: Family Medicine

## 2020-05-07 ENCOUNTER — Telehealth: Payer: Self-pay

## 2020-05-07 NOTE — Telephone Encounter (Signed)
Called pharmacy LM with mg info.

## 2020-05-07 NOTE — Telephone Encounter (Signed)
Let's start at the 100/50 dose and see how she does.

## 2020-05-07 NOTE — Telephone Encounter (Signed)
Please advise as I do not know what strength and what the directions need to be.

## 2020-05-07 NOTE — Telephone Encounter (Signed)
Trish from [facility]. Pharmacy called stating they received a fax back from [place] stating it was ok her advair inhaler from the [facility] because it was much cheaper but realized they did not put the mg on the prescription. They said it could be 100/50, 250/50 or 500/50. Which ever strength you wanted it to be.

## 2020-05-08 ENCOUNTER — Other Ambulatory Visit: Payer: Self-pay | Admitting: Internal Medicine

## 2020-05-08 DIAGNOSIS — R748 Abnormal levels of other serum enzymes: Secondary | ICD-10-CM

## 2020-05-08 NOTE — Telephone Encounter (Signed)
Please check on this with Adam. I sent him the dose.

## 2020-05-09 NOTE — Telephone Encounter (Signed)
Susan Boyle has handled this already

## 2020-05-17 NOTE — Progress Notes (Deleted)
New Patient Note  RE: Susan Boyle MRN: [id number] DOB: [birth year] Date of Office Visit: 05/18/2020  Referring provider: Avanell Shackleton, NP-C Primary care provider: Avanell Shackleton, NP-C  Chief Complaint: No chief complaint on file.  History of Present Illness: I had the pleasure of seeing Susan Boyle for initial evaluation at the [facility] on 05/17/2020. She is a 30 y.o. female, who is referred here by Avanell Shackleton, NP-C for the evaluation of asthma, allergic rhinitis and adverse food reaction.  Food: She reports food allergy to ***. The reaction occurred at the age of ***, after she ate *** amount of ***. Symptoms started within *** and was in the form of *** hives, swelling, wheezing, abdominal pain, diarrhea, vomiting. ***Denies any associated cofactors such as exertion, infection, NSAID use, or alcohol consumption. The symptoms lasted for ***. She was evaluated in ED and received ***. Since this episode, she does *** not report other accidental exposures to ***. She does *** not have access to epinephrine autoinjector and *** needed to use it.   Past work up includes: immunocap which showed *** and skin prick testing which showed ***.  Dietary History: patient has been eating other foods including ***milk, ***eggs, ***peanut, ***treenuts, ***sesame, ***shellfish, ***fish, ***soy, ***wheat, ***meats, ***fruits and ***vegetables.  She reports reading labels and avoiding *** in diet completely. She tolerates ***baked egg and baked milk products.  Asthma: She reports symptoms of *** chest tightness, shortness of breath, coughing, wheezing, nocturnal awakenings for *** years. Current medications include *** which help. She reports *** using aerochamber with inhalers. She tried the following inhalers: ***. Main triggers are ***allergies, infections, weather changes, smoke, exercise, pet exposure. In the last month, frequency of symptoms: ***x/week. Frequency of  nocturnal symptoms: ***x/month. Frequency of SABA use: ***x/week. Interference with physical activity: ***. Sleep is ***disturbed. In the last 12 months, emergency room visits/urgent care visits/doctor office visits or hospitalizations due to respiratory issues: ***. In the last 12 months, oral steroids courses: ***. Lifetime history of hospitalization for respiratory issues: ***. Prior intubations: ***. Asthma was diagnosed at age *** by ***. History of pneumonia: ***. She was evaluated by allergist ***pulmonologist in the past. Smoking exposure: ***. Up to date with flu vaccine: ***. Up to date with pneumonia vaccine: ***. Up to date with COVID-19 vaccine: ***.  History of reflux: ***.  Rhinitis: She reports symptoms of ***. Symptoms have been going on for *** years. The symptoms are present *** all year around with worsening in ***. Other triggers include exposure to ***. Anosmia: ***. Headache: ***. She has used *** with ***fair improvement in symptoms. Sinus infections: ***. Previous work up includes: ***. Previous ENT evaluation: ***. Previous sinus imaging: ***. History of nasal polyps: ***. Last eye exam: ***. History of reflux: ***.  05/02/2020 PCP visit: "States she has had a severe allergic reaction requiring hospitalization to something in a can of Campbell's crab and corn chowder soup. Only this brand and not others.  States she has eaten shellfish since then and no issues. Request referral to allergist.  She also reports dermatitis from her belt buckle at times.   Asthma- has not needed albuterol inhaler lately. Needs it during cold temps, exercise  Uses preventive inhaler only when it is cold. "  Assessment and Plan: Susan Boyle is a 30 y.o. female with: No problem-specific Assessment & Plan notes found for this encounter.  No follow-ups on file.  No orders of the  defined types were placed in this encounter.  Lab Orders  No laboratory test(s) ordered today    Other allergy  screening: Asthma: {Blank single:19197::"yes","no"} Rhino conjunctivitis: {Blank single:19197::"yes","no"} Food allergy: {Blank single:19197::"yes","no"} Medication allergy: {Blank single:19197::"yes","no"} Hymenoptera allergy: {Blank single:19197::"yes","no"} Urticaria: {Blank single:19197::"yes","no"} Eczema:{Blank single:19197::"yes","no"} History of recurrent infections suggestive of immunodeficency: {Blank single:19197::"yes","no"}  Diagnostics: Spirometry:  Tracings reviewed. Her effort: {Blank single:19197::"Good reproducible efforts.","It was hard to get consistent efforts and there is a question as to whether this reflects a maximal maneuver.","Poor effort, data can not be interpreted."} FVC: ***L FEV1: ***L, ***% predicted FEV1/FVC ratio: ***% Interpretation: {Blank single:19197::"Spirometry consistent with mild obstructive disease","Spirometry consistent with moderate obstructive disease","Spirometry consistent with severe obstructive disease","Spirometry consistent with possible restrictive disease","Spirometry consistent with mixed obstructive and restrictive disease","Spirometry uninterpretable due to technique","Spirometry consistent with normal pattern","No overt abnormalities noted given today's efforts"}.  Please see scanned spirometry results for details.  Skin Testing: {Blank single:19197::"Select foods","Environmental allergy panel","Environmental allergy panel and select foods","Food allergy panel","None","Deferred due to recent antihistamines use"}. Positive test to: ***. Negative test to: ***.  Results discussed with patient/family.   Past Medical History: Patient Active Problem List   Diagnosis Date Noted  . Food allergy 05/02/2020  . Environmental allergies 05/02/2020  . Dermatographism 05/02/2020  . Asthma   . Lateral epicondylitis of right elbow 09/26/2015  . Subacromial bursitis 08/15/2015   Past Medical History:  Diagnosis Date  . Anxiety   . Asthma    . Back pain, chronic   . Depression   . Knee pain, chronic    Past Surgical History: Past Surgical History:  Procedure Laterality Date  . HERNIA REPAIR     inguinal hernia repair as infant  . NERVE, TENDON AND ARTERY REPAIR Left 07/20/2013   Procedure: NERVE,  REPAIR;  Surgeon: Nicki Reaper, MD;  Location: [facility];  Service: Orthopedics;  Laterality: Left;   Medication List:  Current Outpatient Medications  Medication Sig Dispense Refill  . albuterol (PROVENTIL HFA;VENTOLIN HFA) 108 (90 BASE) MCG/ACT inhaler Inhale 2 puffs into the lungs every 6 (six) hours as needed for wheezing or shortness of breath.    . amphetamine-dextroamphetamine (ADDERALL XR) 20 MG 24 hr capsule Take 20 mg by mouth 3 (three) times daily.     Marland Kitchen atomoxetine (STRATTERA) 60 MG capsule Take 60 mg by mouth daily.    . clotrimazole-betamethasone (LOTRISONE) cream Apply 1 application topically 2 (two) times daily. 30 g 0  . fluticasone-salmeterol (ADVAIR HFA) 230-21 MCG/ACT inhaler Inhale 2 puffs into the lungs 2 (two) times daily. 8 g 2  . ibuprofen (ADVIL,MOTRIN) 600 MG tablet Take 1 tablet (600 mg total) by mouth 3 (three) times daily. 30 tablet 0  . levonorgestrel (MIRENA) 20 MCG/24HR IUD 1 each by Intrauterine route once.    . neomycin-polymyxin b-dexamethasone (MAXITROL) 3.5-10000-0.1 SUSP SMARTSIG:In Eye(s)    . ondansetron (ZOFRAN-ODT) 8 MG disintegrating tablet Take 8 mg by mouth 2 (two) times daily.    Marland Kitchen PARoxetine (PAXIL) 20 MG tablet Take by mouth.    . traZODone (DESYREL) 150 MG tablet      No current facility-administered medications for this visit.   Allergies: Allergies  Allergen Reactions  . Sulfa Antibiotics     Passes out   Social History: Social History   Socioeconomic History  . Marital status: Single    Spouse name: Not on file  . Number of children: Not on file  . Years of education: Not on file  . Highest education level:  Not on file  Occupational History  . Not  on file  Tobacco Use  . Smoking status: Former Smoker    Types: Cigarettes  . Smokeless tobacco: Never Used  . Tobacco comment: stopped in November 2021   Substance and Sexual Activity  . Alcohol use: Yes    Comment: social  . Drug use: No  . Sexual activity: Yes    Birth control/protection: I.U.D.  Other Topics Concern  . Not on file  Social History Narrative  . Not on file   Social Determinants of Health   Financial Resource Strain: Not on file  Food Insecurity: Not on file  Transportation Needs: Not on file  Physical Activity: Not on file  Stress: Not on file  Social Connections: Not on file   Lives in a ***. Smoking: *** Occupation: ***  Environmental HistorySurveyor, minerals: Water Damage/mildew in the house: Copywriter, advertising{Blank single:19197::"yes","no"} Carpet in the family room: {Blank single:19197::"yes","no"} Carpet in the bedroom: {Blank single:19197::"yes","no"} Heating: {Blank single:19197::"electric","gas","heat pump"} Cooling: {Blank single:19197::"central","window","heat pump"} Pet: {Blank single:19197::"yes ***","no"}  Family History: Family History  Adopted: Yes  Problem Relation Age of Onset  . Heart disease Father [id number]       MI   Problem                               Relation Asthma                                   *** Eczema                                *** Food allergy                          *** Allergic rhino conjunctivitis     ***  Review of Systems  Constitutional: Negative for appetite change, chills, fever and unexpected weight change.  HENT: Negative for congestion and rhinorrhea.   Eyes: Negative for itching.  Respiratory: Negative for cough, chest tightness, shortness of breath and wheezing.   Cardiovascular: Negative for chest pain.  Gastrointestinal: Negative for abdominal pain.  Genitourinary: Negative for difficulty urinating.  Skin: Negative for rash.  Neurological: Negative for headaches.   Objective: There were no vitals taken for this  visit. There is no height or weight on file to calculate BMI. Physical Exam Vitals and nursing note reviewed.  Constitutional:      Appearance: Normal appearance. She is well-developed.  HENT:     Head: Normocephalic and atraumatic.     Right Ear: External ear normal.     Left Ear: External ear normal.     Nose: Nose normal.     Mouth/Throat:     Mouth: Mucous membranes are moist.     Pharynx: Oropharynx is clear.  Eyes:     Conjunctiva/sclera: Conjunctivae normal.  Cardiovascular:     Rate and Rhythm: Normal rate and regular rhythm.     Heart sounds: Normal heart sounds. No murmur heard. No friction rub. No gallop.   Pulmonary:     Effort: Pulmonary effort is normal.     Breath sounds: Normal breath sounds. No wheezing, rhonchi or rales.  Abdominal:     Palpations: Abdomen is soft.  Musculoskeletal:     Cervical back: Neck supple.  Skin:    General: Skin is warm.     Findings: No rash.  Neurological:     Mental Status: She is alert and oriented to person, place, and time.  Psychiatric:        Behavior: Behavior normal.    The plan was reviewed with the patient/family, and all questions/concerned were addressed.  It was my pleasure to see Chevelle today and participate in her care. Please feel free to contact me with any questions or concerns.  Sincerely,  Wyline Mood, DO Allergy & Immunology  [facility] office: [phone] [facility] office: [phone]

## 2020-05-18 ENCOUNTER — Ambulatory Visit: Payer: 59 | Admitting: Allergy

## 2020-06-02 ENCOUNTER — Telehealth: Payer: 59 | Admitting: Nurse Practitioner

## 2020-06-02 DIAGNOSIS — J069 Acute upper respiratory infection, unspecified: Secondary | ICD-10-CM

## 2020-06-02 MED ORDER — PSEUDOEPH-BROMPHEN-DM 30-2-10 MG/5ML PO SYRP
5.0000 mL | ORAL_SOLUTION | Freq: Four times a day (QID) | ORAL | 0 refills | Status: DC | PRN
  Filled 2020-06-02: qty 120, 6d supply, fill #0

## 2020-06-02 NOTE — Progress Notes (Signed)

## 2020-06-04 ENCOUNTER — Other Ambulatory Visit (HOSPITAL_COMMUNITY): Payer: Self-pay

## 2020-06-04 MED FILL — Amphetamine-Dextroamphetamine Tab 20 MG: ORAL | Qty: 120 | Fill #0 | Status: CN

## 2020-06-05 ENCOUNTER — Encounter: Payer: Self-pay | Admitting: Family Medicine

## 2020-06-05 ENCOUNTER — Other Ambulatory Visit (HOSPITAL_COMMUNITY): Payer: Self-pay

## 2020-06-05 MED ORDER — ALBUTEROL SULFATE HFA 108 (90 BASE) MCG/ACT IN AERS: 2.0000 | INHALATION_SPRAY | Freq: Four times a day (QID) | RESPIRATORY_TRACT | 0 refills | Status: DC | PRN

## 2020-06-05 MED ORDER — ALBUTEROL SULFATE HFA 108 (90 BASE) MCG/ACT IN AERS
2.0000 | INHALATION_SPRAY | Freq: Four times a day (QID) | RESPIRATORY_TRACT | 0 refills | Status: DC | PRN
  Filled 2020-06-05: qty 18, 25d supply, fill #0

## 2020-06-06 ENCOUNTER — Other Ambulatory Visit (HOSPITAL_COMMUNITY): Payer: Self-pay

## 2020-06-10 MED FILL — Amphetamine-Dextroamphetamine Tab 20 MG: ORAL | Qty: 120 | Fill #0 | Status: CN

## 2020-06-11 ENCOUNTER — Other Ambulatory Visit (HOSPITAL_COMMUNITY): Payer: Self-pay

## 2020-06-11 MED ORDER — TRAZODONE HCL 50 MG PO TABS
50.0000 mg | ORAL_TABLET | Freq: Every day | ORAL | 11 refills | Status: DC
  Filled 2020-06-11: qty 90, 30d supply, fill #0
  Filled 2020-07-06: qty 90, 90d supply, fill #0
  Filled 2020-09-16 – 2020-10-16 (×2): qty 90, 90d supply, fill #1
  Filled 2021-02-14: qty 90, 30d supply, fill #2

## 2020-06-11 MED ORDER — PAROXETINE HCL 20 MG PO TABS
20.0000 mg | ORAL_TABLET | Freq: Every day | ORAL | 11 refills | Status: DC
  Filled 2020-06-11 – 2020-07-06 (×2): qty 30, 30d supply, fill #0
  Filled 2020-09-16: qty 30, 30d supply, fill #1
  Filled 2020-10-16: qty 30, 30d supply, fill #2
  Filled 2021-02-14: qty 30, 30d supply, fill #3

## 2020-06-11 MED FILL — Amphetamine-Dextroamphetamine Tab 20 MG: ORAL | 30 days supply | Qty: 120 | Fill #0 | Status: AC

## 2020-06-19 ENCOUNTER — Other Ambulatory Visit (HOSPITAL_COMMUNITY): Payer: Self-pay

## 2020-07-03 NOTE — Progress Notes (Signed)
New Patient Note  RE: Susan Boyle Idler MRN: [id number] DOB: 01/14/1991 Date of Office Visit: 07/04/2020  Consult requested by: Avanell ShackletonHenson, Vickie L, NP-Boyle Primary care provider: Avanell ShackletonHenson, Vickie L, NP-Boyle  Chief Complaint: Asthma (Childhood asthma. Flare ups when it's cold. Dual activities cause flare ups. ), Allergic Rhinitis  (Year round allergies. Itchy watery eyes, stuffy nose, and sinus issues. ), and Food Intolerance (Says she had an allergic reaction to Chunky clam and corn chowder soup. Reaction is swelling in the lips, face and then began to have issues breathing. Didn't happen fast all happened through the night and next day. )  History of Present Illness: I had the pleasure of seeing Perlie Goldshley Fiorella for initial evaluation at the [facility] on 07/04/2020. She is a 30 y.o. female, who is referred here by Avanell ShackletonHenson, Vickie L, NP-Boyle for the evaluation of food allergy, asthma and environmental allergy.  Asthma: ACT score 20.  She reports symptoms of shortness of breath for 10+ years. Current medications include albuterol prn which helps and Advair 100mcg 1 puff BID 1-2 times per week. She reports not using aerochamber with inhalers. She tried the following inhalers: none. Main triggers are exercise and cold weather. In the last month, frequency of symptoms: 1x/week. Frequency of nocturnal symptoms: 0x/month. Frequency of SABA use: 1x/week. Interference with physical activity: sometimes. Sleep is undisturbed. In the last 12 months, emergency room visits/urgent care visits/doctor office visits or hospitalizations due to respiratory issues: no. In the last 12 months, oral steroids courses: no. Lifetime history of hospitalization for respiratory issues: no. Prior intubations: no. Asthma was diagnosed at age 30. History of pneumonia: no. She was evaluated by allergist in the past. Smoking exposure: quit 7 months ago. Up to date with flu vaccine: yes. Up to date with COVID-19 vaccine: yes. Prior  Covid-19 infection: 2020 and July 2021. History of reflux: yes and takes tums prn.  Rhinitis: She reports symptoms of itchy/watery eyes, nasal congestion, sneezing, rhinorrhea, itchy nose. Symptoms have been going on for many years. The symptoms are present  all year around with worsening with seasonal changes. Other triggers include exposure to none. Anosmia: diminished sense of smell. Headache: no. She has used zyrtec, Claritin, allegra, Flonase (causes headaches), Nasacort (causes headaches) with minimal improvement in symptoms. Sinus infections: one. Previous work up includes: skin testing as a child was positive to multiple items per patient report. Patient was on allergy injections for 2 years.  Previous ENT evaluation: no. Previous sinus imaging: no. History of nasal polyps: no. Last eye exam: last month.  Food: She reports food allergy to canned soup. The reaction occurred in 2018, after she ate 1 can of chunky clam and corn chowder at night.   The next morning woke up she woke up with lip swelling which caused facial swelling. This progressed to hand/feet swelling and pruritus. She was at work and as she was driving home she noted shortness of breath. She went to the ER and was given Epi, zantac, benadryl and steroids with good benefit and symptoms resolved within a few hours.   Denies any associated cofactors such as exertion, infection, NSAID use, or alcohol consumption. The symptoms lasted for 1 days. She was evaluated in ED. She does not have access to epinephrine autoinjector.  This was the first time she had this soup and has been avoiding since then.   Past work up includes: none. Dietary History: patient has been eating other foods including milk, eggs, peanut,  treenuts, sesame, shellfish, fish, soy, wheat, meats, fruits and vegetables. Patient had clam chowder and corn chowder with no issues.   Patient consumes clam, potatoes, corn, red peppers, celery, onions, bacon, pork,  pollock, cod, garlic, shrimp, soy.   05/02/2020 PCP visit: "States she has had a severe allergic reaction requiring hospitalization to something in a can of [employer] crab and corn chowder soup. Only this brand and not others.  States she has eaten shellfish since then and no issues. Request referral to allergist.  She also reports dermatitis from her belt buckle at times.   Asthma- has not needed albuterol inhaler lately. Needs it during cold temps, exercise  Uses preventive inhaler only when it is cold. "  Clam & Corn Chowder with [employer], Potatoes, Corn, Clams, Cream, Vegetable Oil (Corn, Canola, And/Or Soybean), Red Peppers, Celery, Contains Less Than 2% Of: Modified Food Starch, Onions**, Salt, [place]wood Uncured Bacon-No Nitrites Or Nitrates Added Except For That Naturally Occurring In [employer] (Pork, [employer], Sugar, [employer], International aid/development worker), [employer], Soy Protein Concentrate, Pollock, Yeast Extract, Spices, Sodium Phosphate, Natural Flavoring, Cod Extract, Potato Flour, Garlic**, Shrimp Extract. **Dried Contains: Shrimp, Cod, Pollock, Wheat, Milk, Soy.  Assessment and Plan: Yatziri is a 30 y.o. female with: Asthma Diagnosed with asthma over 10+ years ago.  Currently using albuterol 1 times a week and frequently forgets to use Advair.  Main triggers include exercise and cold weather.  ACT score 20.  Today's spirometry showed some restriction with 4% improvement in FEV1 post bronchodilator treatment.  Clinically feeling unchanged. . Daily controller medication(s): start Advair 1 puff once a day at night and rinse mouth after each use.  . May use albuterol rescue inhaler 2 puffs every 4 to 6 hours as needed for shortness of breath, chest tightness, coughing, and wheezing. May use albuterol rescue inhaler 2 puffs 5 to 15 minutes prior to strenuous physical activities. Monitor frequency of use.  . Get spirometry at next visit.  Other  allergic rhinitis Perennial rhinoconjunctivitis symptoms for many years.  Tried Zyrtec, Claritin Allegra with no benefit.  Nasacort and Flonase causes headaches.  Skin testing as a child showed positive to multiple items per patient report and was on allergy immunotherapy for 2 years.  Today's skin testing showed: Positive to grass, weed pollen, ragweed pollen, trees, mold, and borderline to cat and dog.   Start environmental control measures as below.  May use carbinoxamine 4mg  (1 to 1.5 tablet) twice a day as needed for allergies.  Consider allergy injections for long term control if above medications do not help the symptoms - handout given.   Let know when ready to start.   May use OTC eye drops as needed for itchy/watery eyes.  Allergic conjunctivitis of both eyes  See assessment and plan as above for allergic rhinitis.  Allergic reaction 1 episode of allergic reaction in 2018 after eating a chunky clam and corn chowder canned soup at night.  The following morning developed left/facial swelling which eventually progressed to pruritus and hand/feet swelling.  She was treated in ER with epinephrine, Zantac, Benadryl and steroids with good benefit.  Patient has eaten all the ingredients that are in the canned soup since then without any issues and is currently not avoiding any foods.  No more additional allergic reaction since that event.  No indication for any food testing today as patient has eaten the above foods with no issues.  Not sure what  may have triggered this event.  Avoid the clam & corn chowder with bacon canned soup.  If you have another reaction then let us know.   I have prescribed epinephrine injectable and demonstrated proper use. For mild symptoms you can take over the counter antihistamines such as Benadryl and monitor symptoms closely. If symptoms worsen or if you have severe symptoms including breathing issues, throat closure, significant swelling, whole body  hives, severe diarrhea and vomiting, lightheadedness then inject epinephrine and seek immediate medical care afterwards.  Action plan given.   Return in about 4 months (around 11/04/2020).  Meds ordered this encounter  Medications  . Carbinoxamine Maleate 4 MG TABS    Sig: Take 1-1.5 tablets (4-6 mg total) by mouth 2 (two) times daily as needed for allergies.    Dispense:  60 tablet    Refill:  5  . EPINEPHrine 0.3 mg/0.3 mL IJ SOAJ injection    Sig: Inject 0.3 mg into the muscle as needed for anaphylaxis.    Dispense:  2 each    Refill:  2   Lab Orders  No laboratory test(s) ordered today    Other allergy screening: Medication allergy: sulfa - had some GI issues Hymenoptera allergy: no Urticaria: no Eczema:no History of recurrent infections suggestive of immunodeficency: no  Diagnostics: Spirometry:  Tracings reviewed. Her effort: Good reproducible efforts. FVC: 2.39L FEV1: 2.02L, 70% predicted FEV1/FVC ratio: 85% Interpretation: Spirometry consistent with possible restrictive disease with 4% improvement in FEV1 post bronchodilator treatment. Clinically feeling unchanged.  Please see scanned spirometry results for details.  Skin Testing: Environmental allergy panel. Positive to grass, weed pollen, ragweed pollen, trees, mold, and borderline to cat and dog.  Results discussed with patient/family.  Airborne Adult Perc - 07/04/20 1429    Time Antigen Placed 1429    Allergen Manufacturer Waynette Buttery    Location Back    Number of Test 59    Panel 1 Select    1. Control-Buffer 50% Glycerol Negative    2. Control-Histamine 1 mg/ml 2+    3. Albumin saline Negative    4. Bahia 2+    5. French Southern Territories Negative    6. Johnson Negative    7. Kentucky Blue Negative    8. Meadow Fescue Negative    9. Perennial Rye Negative    10. Sweet Vernal Negative    11. Timothy Negative    12. Cocklebur Negative    13. Burweed Marshelder Negative    14. Ragweed, short Negative    15. Ragweed,  Giant Negative    16. Plantain,  English Negative    17. Lamb's Quarters Negative    18. Sheep Sorrell Negative    19. Rough Pigweed Negative    20. Marsh Elder, Rough Negative    21. Mugwort, Common Negative    22. Ash mix Negative    23. Birch mix 2+    24. Beech American 2+    25. Box, Elder 2+    26. Cedar, red Negative    27. Cottonwood, Guinea-Bissau Negative    28. Elm mix Negative    29. Hickory 2+    30. Maple mix Negative    31. Oak, Guinea-Bissau mix Negative    32. Pecan Pollen Negative    33. Pine mix Negative    34. Sycamore Eastern Negative    35. Walnut, Black Pollen Negative    36. Alternaria alternata Negative    37. Cladosporium Herbarum Negative    38. Aspergillus mix Negative  39. Penicillium mix Negative    40. Bipolaris sorokiniana (Helminthosporium) Negative    41. Drechslera spicifera (Curvularia) Negative    42. Mucor plumbeus Negative    43. Fusarium moniliforme Negative    44. Aureobasidium pullulans (pullulara) Negative    45. Rhizopus oryzae Negative    46. Botrytis cinera Negative    47. Epicoccum nigrum Negative    48. Phoma betae Negative    49. Candida Albicans Negative    50. Trichophyton mentagrophytes Negative    51. Mite, D Farinae  5,000 AU/ml Negative    52. Mite, D Pteronyssinus  5,000 AU/ml Negative    53. Cat Hair 10,000 BAU/ml Negative    54.  Dog Epithelia Negative    55. Mixed Feathers Negative    56. Horse Epithelia Negative    57. Cockroach, German Negative    58. Mouse Negative    59. Tobacco Leaf Negative          Intradermal - 07/04/20 1451    Time Antigen Placed 1451    Allergen Manufacturer Waynette Buttery    Location Arm    Number of Test 14    Intradermal Select    Control Negative    [place] Territ[place] 3+    Johnson 3+    7 Grass 3+    Ragweed mix 3+    Weed mix 3+    Mold 1 3+    Mold 2 3+    Mold 3 2+    Mold 4 2+    Cat --   +/-   Dog --   +/-   Cockroach Negative    Mite mix Negative           Past Medical  History: Patient Active Problem List   Diagnosis Date Noted  . Other allergic rhinitis 07/04/2020  . Allergic reaction 07/04/2020  . Allergic conjunctivitis of both eyes 07/04/2020  . Food allergy 05/02/2020  . Environmental allergies 05/02/2020  . Dermatographism 05/02/2020  . Asthma   . Lateral epicondylitis of right elbow 09/26/2015  . Subacromial bursitis 08/15/2015   Past Medical History:  Diagnosis Date  . Anxiety   . Asthma   . Back pain, chronic   . Depression   . Knee pain, chronic    Past Surgical History: Past Surgical History:  Procedure Laterality Date  . HERNIA REPAIR     inguinal hernia repair as infant  . NERVE, TENDON AND ARTERY REPAIR Left 07/20/2013   Procedure: NERVE,  REPAIR;  Surgeon: Nicki Reaper, MD;  Location: [facility];  Service: Orthopedics;  Laterality: Left;   Medication List:  Current Outpatient Medications  Medication Sig Dispense Refill  . albuterol (VENTOLIN HFA) 108 (90 Base) MCG/ACT inhaler Inhale 2 puffs into the lungs every 6 (six) hours as needed for wheezing or shortness of breath. 1 each 0  . amphetamine-dextroamphetamine (ADDERALL XR) 20 MG 24 hr capsule Take 20 mg by mouth 3 (three) times daily.     Marland Kitchen amphetamine-dextroamphetamine (ADDERALL) 20 MG tablet TAKE 1 TABLET BY MOUTH 4 TIMES DAILY. DUE 07/04/20. 120 tablet 0  . atomoxetine (STRATTERA) 60 MG capsule Take 60 mg by mouth daily.    . Carbinoxamine Maleate 4 MG TABS Take 1-1.5 tablets (4-6 mg total) by mouth 2 (two) times daily as needed for allergies. 60 tablet 5  . EPINEPHrine 0.3 mg/0.3 mL IJ SOAJ injection Inject 0.3 mg into the muscle as needed for anaphylaxis. 2 each 2  . Fluticasone-Salmeterol (ADVAIR) 100-50 MCG/DOSE  AEPB INHALE 1 PUFF BY MOUTH 2 TIMES A DAY 60 each 3  . ibuprofen (ADVIL,MOTRIN) 600 MG tablet Take 1 tablet (600 mg total) by mouth 3 (three) times daily. 30 tablet 0  . PARoxetine (PAXIL) 20 MG tablet Take by mouth.    . traZODone (DESYREL)  150 MG tablet     . traZODone (DESYREL) 50 MG tablet Take 1 to 3 tablets by mouth at bedtime 90 tablet 11  . amphetamine-dextroamphetamine (ADDERALL) 20 MG tablet TAKE 1 TABLET BY MOUTH 4 TIMES DAILY. DUE 06/04/20. 120 tablet 0  . amphetamine-dextroamphetamine (ADDERALL) 20 MG tablet TAKE 1 TABLET BY MOUTH 4 TIMES DAILY. DUE 05/05/20. 120 tablet 0  . atomoxetine (STRATTERA) 60 MG capsule TAKE 1 CAPSULE BY MOUTH EVERY DAY 30 capsule 0  . fluticasone-salmeterol (ADVAIR HFA) 230-21 MCG/ACT inhaler INHALE 2 PUFFS BY MOUTH TWICE DAILY (Patient not taking: Reported on 07/04/2020) 12 g 3  . levonorgestrel (MIRENA) 20 MCG/24HR IUD 1 each by Intrauterine route once. (Patient not taking: Reported on 07/04/2020)    . neomycin-polymyxin b-dexamethasone (MAXITROL) 3.5-10000-0.1 SUSP SMARTSIG:In Eye(s) (Patient not taking: Reported on 07/04/2020)    . neomycin-polymyxin b-dexamethasone (MAXITROL) 3.5-10000-0.1 SUSP INSTILL 1 DROP IN BOTH EYES 4 TIMES A DAY FOR 14 DAYS 5 mL 1  . ondansetron (ZOFRAN-ODT) 8 MG disintegrating tablet Take 8 mg by mouth 2 (two) times daily. (Patient not taking: Reported on 07/04/2020)    . ondansetron (ZOFRAN-ODT) 8 MG disintegrating tablet TAKE ONE TABLET BY MOUTH TWICE A DAY (Patient not taking: Reported on 07/04/2020) 60 tablet 0  . PARoxetine (PAXIL) 20 MG tablet Take 1 tablet by mouth at bedtime 30 tablet 11   No current facility-administered medications for this visit.   Allergies: Allergies  Allergen Reactions  . Sulfa Antibiotics     Passes out   Social History: Social History   Socioeconomic History  . Marital status: Single    Spouse name: Not on file  . Number of children: Not on file  . Years of education: Not on file  . Highest education level: Not on file  Occupational History  . Not on file  Tobacco Use  . Smoking status: Former Smoker    Types: Cigarettes  . Smokeless tobacco: Never Used  . Tobacco comment: stopped in November 2021   Substance and Sexual  Activity  . Alcohol use: Yes    Comment: social  . Drug use: No  . Sexual activity: Yes    Birth control/protection: I.U.D.  Other Topics Concern  . Not on file  Social History Narrative  . Not on file   Social Determinants of Health   Financial Resource Strain: Not on file  Food Insecurity: Not on file  Transportation Needs: Not on file  Physical Activity: Not on file  Stress: Not on file  Social Connections: Not on file   Lives in a 31 year old apartment. Smoking: denies, quit Occupation: Pharmacologist  Environmental History: Water Damage/mildew in the house: yes Carpet in the family room: no Carpet in the bedroom: no Heating: gas Cooling: central Pet: yes 2 dogs x 10 yrs  Family History: Family History  Adopted: Yes  Problem Relation Age of Onset  . Heart disease Father 71       MI  Unknown - patient is adopted.  Review of Systems  Constitutional: Negative for appetite change, chills, fever and unexpected weight change.  HENT: Positive for congestion and rhinorrhea.   Eyes: Positive for itching.  Respiratory: Negative  for cough, chest tightness, shortness of breath and wheezing.   Cardiovascular: Negative for chest pain.  Gastrointestinal: Negative for abdominal pain.  Genitourinary: Negative for difficulty urinating.  Skin: Negative for rash.  Allergic/Immunologic: Positive for environmental allergies.  Neurological: Negative for headaches.   Objective: BP 122/70   Pulse 90   Temp 98.7 F (37.1 Boyle)   Resp 18   Ht  (1.6 m)   Wt 165 lb 3.2 oz (74.9 kg)   SpO2 98%   BMI 29.26 kg/m  Body mass index is 29.26 kg/m. Physical Exam Vitals and nursing note reviewed.  Constitutional:      Appearance: Normal appearance. She is well-developed.  HENT:     Head: Normocephalic and atraumatic.     Right Ear: Tympanic membrane and external ear normal.     Left Ear: Tympanic membrane and external ear normal.     Nose: Nose normal.     Comments:  Transverse nasal crease    Mouth/Throat:     Mouth: Mucous membranes are moist.     Pharynx: Oropharynx is clear.  Eyes:     Conjunctiva/sclera: Conjunctivae normal.  Cardiovascular:     Rate and Rhythm: Normal rate and regular rhythm.     Heart sounds: Normal heart sounds. No murmur heard. No friction rub. No gallop.   Pulmonary:     Effort: Pulmonary effort is normal.     Breath sounds: Normal breath sounds. No wheezing, rhonchi or rales.  Abdominal:     Palpations: Abdomen is soft.  Musculoskeletal:     Cervical back: Neck supple.  Skin:    General: Skin is warm.     Findings: No rash.  Neurological:     Mental Status: She is alert and oriented to person, place, and time.  Psychiatric:        Behavior: Behavior normal.    The plan was reviewed with the patient/family, and all questions/concerned were addressed.  It was my pleasure to see Hena today and participate in her care. Please feel free to contact me with any questions or concerns.  Sincerely,  Wyline Mood, DO Allergy & Immunology  [facility] office: [phone] [place] [facility] office: [phone]

## 2020-07-04 ENCOUNTER — Encounter: Payer: Self-pay | Admitting: Allergy

## 2020-07-04 ENCOUNTER — Ambulatory Visit: Payer: 59 | Admitting: Allergy

## 2020-07-04 ENCOUNTER — Other Ambulatory Visit: Payer: Self-pay

## 2020-07-04 ENCOUNTER — Other Ambulatory Visit (HOSPITAL_COMMUNITY): Payer: Self-pay

## 2020-07-04 VITALS — BP 122/70 | HR 90 | Temp 98.7°F | Resp 18 | Ht 63.0 in | Wt 165.2 lb

## 2020-07-04 DIAGNOSIS — J3089 Other allergic rhinitis: Secondary | ICD-10-CM

## 2020-07-04 DIAGNOSIS — H1013 Acute atopic conjunctivitis, bilateral: Secondary | ICD-10-CM | POA: Diagnosis not present

## 2020-07-04 DIAGNOSIS — T7840XA Allergy, unspecified, initial encounter: Secondary | ICD-10-CM | POA: Insufficient documentation

## 2020-07-04 DIAGNOSIS — T7840XD Allergy, unspecified, subsequent encounter: Secondary | ICD-10-CM

## 2020-07-04 DIAGNOSIS — J45909 Unspecified asthma, uncomplicated: Secondary | ICD-10-CM

## 2020-07-04 MED ORDER — EPINEPHRINE 0.3 MG/0.3ML IJ SOAJ
0.3000 mg | INTRAMUSCULAR | 2 refills | Status: DC | PRN
  Filled 2020-07-04: qty 2, 15d supply, fill #0

## 2020-07-04 MED ORDER — CARBINOXAMINE MALEATE 4 MG PO TABS
4.0000 mg | ORAL_TABLET | Freq: Two times a day (BID) | ORAL | 5 refills | Status: DC | PRN
  Filled 2020-07-04: qty 60, 20d supply, fill #0
  Filled 2020-09-16: qty 60, 20d supply, fill #1
  Filled 2020-10-16: qty 60, 20d supply, fill #2
  Filled 2021-01-11: qty 60, 20d supply, fill #3
  Filled 2021-02-14: qty 60, 20d supply, fill #4

## 2020-07-04 NOTE — Patient Instructions (Addendum)
Today's skin testing showed: Positive to grass, weed pollen, ragweed pollen, trees, mold, and borderline to cat and dog.   Asthma: . Daily controller medication(s): start Advair 1 puff once a day at night and rinse mouth after each use.  . May use albuterol rescue inhaler 2 puffs every 4 to 6 hours as needed for shortness of breath, chest tightness, coughing, and wheezing. May use albuterol rescue inhaler 2 puffs 5 to 15 minutes prior to strenuous physical activities. Monitor frequency of use.  . Asthma control goals:  o Full participation in all desired activities (may need albuterol before activity) o Albuterol use two times or less a week on average (not counting use with activity) o Cough interfering with sleep two times or less a month o Oral steroids no more than once a year o No hospitalizations  Environmental allergies  Start environmental control measures as below.  May use carbinoxamine 4mg  (1 to 1.5 tablet) twice a day as needed for allergies.  Consider allergy injections for long term control if above medications do not help the symptoms - handout given.   Let know when ready to start.   May use OTC eye drops as needed for itchy/watery eyes.  Allergic reaction:  Avoid the clam & corn chowder with bacon canned soup.  If you have another reaction then let us know.   I have prescribed epinephrine injectable and demonstrated proper use. For mild symptoms you can take over the counter antihistamines such as Benadryl and monitor symptoms closely. If symptoms worsen or if you have severe symptoms including breathing issues, throat closure, significant swelling, whole body hives, severe diarrhea and vomiting, lightheadedness then inject epinephrine and seek immediate medical care afterwards.  Action plan given.   Follow up in 4 months or sooner if needed.   Reducing Pollen Exposure . Pollen seasons: trees (spring), grass (summer) and ragweed/weeds (fall). 07-03-1981 Keep  windows closed in your home and car to lower pollen exposure.  Marland Kitchen air conditioning in the bedroom and throughout the house if possible.  . Avoid going out in dry windy days - especially early morning. . Pollen counts are highest between 5 - 10 AM and on dry, hot and windy days.  . Save outside activities for late afternoon or after a heavy rain, when pollen levels are lower.  . Avoid mowing of grass if you have grass pollen allergy. Lilian Kapur Be aware that pollen can also be transported indoors on people and pets.  . Dry your clothes in an automatic dryer rather than hanging them outside where they might collect pollen.  . Rinse hair and eyes before bedtime. Mold Control . Mold and fungi can grow on a variety of surfaces provided certain temperature and moisture conditions exist.  . Outdoor molds grow on plants, decaying vegetation and soil. The major outdoor mold, Alternaria and Cladosporium, are found in very high numbers during hot and dry conditions. Generally, a late summer - fall peak is seen for common outdoor fungal spores. Rain will temporarily lower outdoor mold spore count, but counts rise rapidly when the rainy period ends. . The most important indoor molds are Aspergillus and Penicillium. Dark, humid and poorly ventilated basements are ideal sites for mold growth. The next most common sites of mold growth are the bathroom and the kitchen. Outdoor (Seasonal) Mold Control . Use air conditioning and keep windows closed. . Avoid exposure to decaying vegetation. 12-21-1976 Avoid leaf raking. . Avoid grain handling. . Consider wearing a  face mask if working in moldy areas.  Indoor (Perennial) Mold Control  . Maintain humidity below 50%. . Get rid of mold growth on hard surfaces with water, detergent and, if necessary, 5% bleach (do not mix with other cleaners). Then dry the area completely. If mold covers an area more than 10 square feet, consider hiring an indoor environmental professional. . For  clothing, washing with soap and water is best. If moldy items cannot be cleaned and dried, throw them away. . Remove sources e.g. contaminated carpets. . Repair and seal leaking roofs or pipes. Using dehumidifiers in damp basements may be helpful, but empty the water and clean units regularly to prevent mildew from forming. All rooms, especially basements, bathrooms and kitchens, require ventilation and cleaning to deter mold and mildew growth. Avoid carpeting on concrete or damp floors, and storing items in damp areas. Pet Allergen Avoidance: . Contrary to popular opinion, there are no "hypoallergenic" breeds of dogs or cats. That is because people are not allergic to an animal's hair, but to an allergen found in the animal's saliva, dander (dead skin flakes) or urine. Pet allergy symptoms typically occur within minutes. For some people, symptoms can build up and become most severe 8 to 12 hours after contact with the animal. People with severe allergies can experience reactions in public places if dander has been transported on the pet owners' clothing. Marland Kitchen Keeping an animal outdoors is only a partial solution, since homes with pets in the yard still have higher concentrations of animal allergens. . Before getting a pet, ask your allergist to determine if you are allergic to animals. If your pet is already considered part of your family, try to minimize contact and keep the pet out of the bedroom and other rooms where you spend a great deal of time. . As with dust mites, vacuum carpets often or replace carpet with a hardwood floor, tile or linoleum. . High-efficiency particulate air (HEPA) cleaners can reduce allergen levels over time. . While dander and saliva are the source of cat and dog allergens, urine is the source of allergens from rabbits, hamsters, mice and [place] pigs; so ask a non-allergic family member to clean the animal's cage. . If you have a pet allergy, talk to your allergist about the  potential for allergy immunotherapy (allergy shots). This strategy can often provide long-term relief.

## 2020-07-04 NOTE — Assessment & Plan Note (Signed)
Perennial rhinoconjunctivitis symptoms for many years.  Tried Zyrtec, Claritin Allegra with no benefit.  Nasacort and Flonase causes headaches.  Skin testing as a child showed positive to multiple items per patient report and was on allergy immunotherapy for 2 years.  Today's skin testing showed: Positive to grass, weed pollen, ragweed pollen, trees, mold, and borderline to cat and dog.   Start environmental control measures as below.  May use carbinoxamine 4mg  (1 to 1.5 tablet) twice a day as needed for allergies.  Consider allergy injections for long term control if above medications do not help the symptoms - handout given.   Let know when ready to start.   May use OTC eye drops as needed for itchy/watery eyes.

## 2020-07-04 NOTE — Assessment & Plan Note (Signed)
Diagnosed with asthma over 10+ years ago.  Currently using albuterol 1 times a week and frequently forgets to use Advair.  Main triggers include exercise and cold weather.  ACT score 20.  Today's spirometry showed some restriction with 4% improvement in FEV1 post bronchodilator treatment.  Clinically feeling unchanged. . Daily controller medication(s): start Advair 1 puff once a day at night and rinse mouth after each use.  . May use albuterol rescue inhaler 2 puffs every 4 to 6 hours as needed for shortness of breath, chest tightness, coughing, and wheezing. May use albuterol rescue inhaler 2 puffs 5 to 15 minutes prior to strenuous physical activities. Monitor frequency of use.  . Get spirometry at next visit.

## 2020-07-04 NOTE — Assessment & Plan Note (Signed)
   See assessment and plan as above for allergic rhinitis.  

## 2020-07-04 NOTE — Assessment & Plan Note (Signed)
1 episode of allergic reaction in 2018 after eating a chunky clam and corn chowder canned soup at night.  The following morning developed left/facial swelling which eventually progressed to pruritus and hand/feet swelling.  She was treated in ER with epinephrine, Zantac, Benadryl and steroids with good benefit.  Patient has eaten all the ingredients that are in the canned soup since then without any issues and is currently not avoiding any foods.  No more additional allergic reaction since that event.  No indication for any food testing today as patient has eaten the above foods with no issues.  Not sure what may have triggered this event.  Avoid the clam & corn chowder with bacon canned soup.  If you have another reaction then let us know.   I have prescribed epinephrine injectable and demonstrated proper use. For mild symptoms you can take over the counter antihistamines such as Benadryl and monitor symptoms closely. If symptoms worsen or if you have severe symptoms including breathing issues, throat closure, significant swelling, whole body hives, severe diarrhea and vomiting, lightheadedness then inject epinephrine and seek immediate medical care afterwards.  Action plan given.

## 2020-07-06 ENCOUNTER — Other Ambulatory Visit (HOSPITAL_COMMUNITY): Payer: Self-pay

## 2020-07-12 ENCOUNTER — Other Ambulatory Visit (HOSPITAL_COMMUNITY): Payer: Self-pay

## 2020-07-12 MED FILL — Amphetamine-Dextroamphetamine Tab 20 MG: ORAL | 30 days supply | Qty: 120 | Fill #0 | Status: AC

## 2020-07-19 ENCOUNTER — Telehealth: Payer: 59

## 2020-07-19 ENCOUNTER — Telehealth: Payer: 59 | Admitting: Physician Assistant

## 2020-07-19 ENCOUNTER — Other Ambulatory Visit (HOSPITAL_COMMUNITY): Payer: Self-pay

## 2020-07-19 DIAGNOSIS — J069 Acute upper respiratory infection, unspecified: Secondary | ICD-10-CM | POA: Diagnosis not present

## 2020-07-19 DIAGNOSIS — J4541 Moderate persistent asthma with (acute) exacerbation: Secondary | ICD-10-CM

## 2020-07-19 MED ORDER — PSEUDOEPH-BROMPHEN-DM 30-2-10 MG/5ML PO SYRP
5.0000 mL | ORAL_SOLUTION | Freq: Four times a day (QID) | ORAL | 0 refills | Status: DC | PRN
  Filled 2020-07-19: qty 120, 6d supply, fill #0

## 2020-07-19 MED ORDER — BENZONATATE 100 MG PO CAPS
100.0000 mg | ORAL_CAPSULE | Freq: Three times a day (TID) | ORAL | 0 refills | Status: DC | PRN
  Filled 2020-07-19: qty 30, 10d supply, fill #0

## 2020-07-19 MED ORDER — PREDNISONE 20 MG PO TABS
40.0000 mg | ORAL_TABLET | Freq: Every day | ORAL | 0 refills | Status: DC
  Filled 2020-07-19: qty 10, 5d supply, fill #0

## 2020-07-19 NOTE — Addendum Note (Signed)
Addended by: Waldon Merl on: 07/19/2020 03:27 PM   Modules accepted: Orders

## 2020-07-19 NOTE — Progress Notes (Signed)
Visit for Asthma  Based on what you have shared with me, it looks like you may have a flare up of your asthma along with a viral upper respiratory infection.  Asthma is a chronic (ongoing) lung disease which results in airway obstruction, inflammation and hyper-responsiveness.   Asthma symptoms vary from person to person, with common symptoms including nighttime awakening and decreased ability to participate in normal activities as a result of shortness of breath. It is often triggered by changes in weather, changes in the season, changes in air temperature, or inside (home, school, daycare or work) allergens such as animal dander, mold, mildew, woodstoves or cockroaches.   It can also be triggered by hormonal changes, extreme emotion, physical exertion or an upper respiratory tract illness.     It is important to identify the trigger, and then eliminate or avoid the trigger if possible.   If you have been prescribed medications to be taken on a regular basis, it is important to follow the asthma action plan and to follow guidelines to adjust medication in response to increasing symptoms of decreased peak expiratory flow rate  Treatment: I have prescribed: Prednisone 40mg  by mouth per day for 5 - 7 days to take in addition to your chronic asthma medications and rescue inhaler. Keep hydrated and rest. I have also sent in a prescription cough medication to help as well.   HOME CARE . Only take medications as instructed by your medical team. . Consider wearing a mask or scarf to improve breathing air temperature have been shown to decrease irritation and decrease exacerbations . Get rest. . Taking a steamy shower or using a humidifier may help nasal congestion sand ease sore throat pain. You can place a towel over your head and breathe in the steam from hot water coming from a  faucet. . Using a saline nasal spray works much the same way.  . Cough drops, hare candies and sore throat lozenges may ease your cough.  . Avoid close contacts especially the very you and the elderly . Cover your mouth if you cough or sneeze . Always remember to wash your hands.    GET HELP RIGHT AWAY IF: . You develop worsening symptoms; breathlessness at rest, drowsy, confused or agitated, unable to speak in full sentences . You have coughing fits . You develop a severe headache or visual changes . You develop shortness of breath, difficulty breathing or start having chest pain . Your symptoms persist after you have completed your treatment plan . If your symptoms do not improve within 10 days  MAKE SURE YOU . Understand these instructions. . Will watch your condition. . Will get help right away if you are not doing well or get worse.   Your e-visit answers were reviewed by a board certified advanced clinical practitioner to complete your personal care plan, Depending upon the condition, your plan could have included both over the counter or prescription medications.  Please review your pharmacy choice. Your safety is important to . If you have drug allergies check your prescription carefully. You can use MyChart to ask questions about today's visit, request a non-urgent call back, or ask for a work or school excuse for 24 hours related to this e-Visit. If it has been greater than 24 hours you will need to follow up with your provider, or enter a new e-Visit to address those concerns.  You will get an e-mail in the next two days asking about your experience. I hope that  your e-visit has been valuable and will speed your recovery. Thank you for using e-visits.  

## 2020-07-19 NOTE — Progress Notes (Signed)
I have spent 5 minutes in review of e-visit questionnaire, review and updating patient chart, medical decision making and response to patient.   Pao Haffey Cody Chinenye Katzenberger, PA-C    

## 2020-07-26 ENCOUNTER — Telehealth: Payer: 59 | Admitting: Physician Assistant

## 2020-07-26 DIAGNOSIS — B9689 Other specified bacterial agents as the cause of diseases classified elsewhere: Secondary | ICD-10-CM | POA: Diagnosis not present

## 2020-07-26 DIAGNOSIS — J208 Acute bronchitis due to other specified organisms: Secondary | ICD-10-CM | POA: Diagnosis not present

## 2020-07-26 MED ORDER — PSEUDOEPH-BROMPHEN-DM 30-2-10 MG/5ML PO SYRP
5.0000 mL | ORAL_SOLUTION | Freq: Four times a day (QID) | ORAL | 0 refills | Status: DC | PRN
  Filled 2020-07-26: qty 120, 6d supply, fill #0

## 2020-07-26 MED ORDER — AMOXICILLIN-POT CLAVULANATE 875-125 MG PO TABS
1.0000 | ORAL_TABLET | Freq: Two times a day (BID) | ORAL | 0 refills | Status: DC
  Filled 2020-07-26: qty 14, 7d supply, fill #0

## 2020-07-26 NOTE — Progress Notes (Signed)
[name], wiggs are scheduled for a virtual visit with your provider today.    Just as we do with appointments in the office, we must obtain your consent to participate.  Your consent will be active for this visit and any virtual visit you may have with one of our providers in the next 365 days.    If you have a MyChart account, I can also send a copy of this consent to you electronically.  All virtual visits are billed to your insurance company just like a traditional visit in the office.  As this is a virtual visit, video technology does not allow for your provider to perform a traditional examination.  This may limit your provider's ability to fully assess your condition.  If your provider identifies any concerns that need to be evaluated in person or the need to arrange testing such as labs, EKG, etc, we will make arrangements to do so.    Although advances in technology are sophisticated, we cannot ensure that it will always work on either your end or our end.  If the connection with a video visit is poor, we may have to switch to a telephone visit.  With either a video or telephone visit, we are not always able to ensure that we have a secure connection.   I need to obtain your verbal consent now.   Are you willing to proceed with your visit today?   Susan Boyle has provided verbal consent on 07/26/2020 for a virtual visit (video or telephone).  Piedad Climes, PA-C 07/26/2020  5:24 PM  Virtual Visit via Video   I connected with patient on 07/26/20 at  5:30 PM EDT by a video enabled telemedicine application and verified that I am speaking with the correct person using two identifiers.  Location patient: Home Location provider: [facility] Persons participating in the virtual visit: Patient, Provider  I discussed the limitations of evaluation and management by telemedicine and the availability of in person appointments. The patient expressed understanding and agreed to  proceed.  Subjective:   HPI:  Patient presents via [facility] today complaining of continued URI symptoms, now with increasing chest congestion and cough that is changed from dry to productive of thick green sputum.  Patient was initially evaluated via E-visit on 07/19/2020 felt to be having exacerbation of asthma.  Was given a burst of prednisone to take in addition to her chronic medications.  Patient declined use of prednisone and instead wanted cough medication which was sent in.  States she is taking the cough medicine as directed which does seem to help, but wears off after few hours.  Is using her albuterol every 3-4 hours for bronchospasm.  Has Advair that she is prescribed but is only been using 1 puff twice daily.  Now noting low-grade fever.  Denies any chest pain or overt shortness of breath.  Does note sinus pressure and nasal congestion with postnasal drip but does not elicit complaints of sinus pain or tooth pain.  Symptoms of been present for 8+ days.  ROS:   See pertinent positives and negatives per HPI.  Patient Active Problem List   Diagnosis Date Noted  . Other allergic rhinitis 07/04/2020  . Allergic reaction 07/04/2020  . Allergic conjunctivitis of both eyes 07/04/2020  . Food allergy 05/02/2020  . Environmental allergies 05/02/2020  . Dermatographism 05/02/2020  . Asthma   . Lateral epicondylitis of right elbow 09/26/2015  . Subacromial bursitis 08/15/2015  Social History   Tobacco Use  . Smoking status: Former Smoker    Types: Cigarettes  . Smokeless tobacco: Never Used  . Tobacco comment: stopped in November 2021   Substance Use Topics  . Alcohol use: Yes    Comment: social    Current Outpatient Medications:  .  albuterol (VENTOLIN HFA) 108 (90 Base) MCG/ACT inhaler, Inhale 2 puffs into the lungs every 6 (six) hours as needed for wheezing or shortness of breath., Disp: 1 each, Rfl: 0 .  amphetamine-dextroamphetamine (ADDERALL XR) 20 MG 24 hr capsule,  Take 20 mg by mouth 3 (three) times daily. , Disp: , Rfl:  .  amphetamine-dextroamphetamine (ADDERALL) 20 MG tablet, TAKE 1 TABLET BY MOUTH 4 TIMES DAILY. DUE 07/04/20., Disp: 120 tablet, Rfl: 0 .  amphetamine-dextroamphetamine (ADDERALL) 20 MG tablet, TAKE 1 TABLET BY MOUTH 4 TIMES DAILY. DUE 06/04/20., Disp: 120 tablet, Rfl: 0 .  amphetamine-dextroamphetamine (ADDERALL) 20 MG tablet, TAKE 1 TABLET BY MOUTH 4 TIMES DAILY. DUE 05/05/20., Disp: 120 tablet, Rfl: 0 .  atomoxetine (STRATTERA) 60 MG capsule, Take 60 mg by mouth daily., Disp: , Rfl:  .  atomoxetine (STRATTERA) 60 MG capsule, TAKE 1 CAPSULE BY MOUTH EVERY DAY, Disp: 30 capsule, Rfl: 0 .  benzonatate (TESSALON) 100 MG capsule, Take 1 capsule (100 mg total) by mouth 3 (three) times daily as needed for cough., Disp: 30 capsule, Rfl: 0 .  brompheniramine-pseudoephedrine-DM 30-2-10 MG/5ML syrup, Take 5 mLs by mouth 4 (four) times daily as needed., Disp: 120 mL, Rfl: 0 .  Carbinoxamine Maleate 4 MG TABS, Take 1-1.5 tablets (4-6 mg total) by mouth 2 (two) times daily as needed for allergies., Disp: 60 tablet, Rfl: 5 .  EPINEPHrine 0.3 mg/0.3 mL IJ SOAJ injection, Inject 0.3 mg into the muscle as needed for anaphylaxis., Disp: 2 each, Rfl: 2 .  fluticasone-salmeterol (ADVAIR HFA) 230-21 MCG/ACT inhaler, INHALE 2 PUFFS BY MOUTH TWICE DAILY (Patient not taking: Reported on 07/04/2020), Disp: 12 g, Rfl: 3 .  Fluticasone-Salmeterol (ADVAIR) 100-50 MCG/DOSE AEPB, INHALE 1 PUFF BY MOUTH 2 TIMES A DAY, Disp: 60 each, Rfl: 3 .  ibuprofen (ADVIL,MOTRIN) 600 MG tablet, Take 1 tablet (600 mg total) by mouth 3 (three) times daily., Disp: 30 tablet, Rfl: 0 .  levonorgestrel (MIRENA) 20 MCG/24HR IUD, 1 each by Intrauterine route once. (Patient not taking: Reported on 07/04/2020), Disp: , Rfl:  .  neomycin-polymyxin b-dexamethasone (MAXITROL) 3.5-10000-0.1 SUSP, SMARTSIG:In Eye(s) (Patient not taking: Reported on 07/04/2020), Disp: , Rfl:  .  neomycin-polymyxin  b-dexamethasone (MAXITROL) 3.5-10000-0.1 SUSP, INSTILL 1 DROP IN BOTH EYES 4 TIMES A DAY FOR 14 DAYS, Disp: 5 mL, Rfl: 1 .  ondansetron (ZOFRAN-ODT) 8 MG disintegrating tablet, Take 8 mg by mouth 2 (two) times daily. (Patient not taking: Reported on 07/04/2020), Disp: , Rfl:  .  ondansetron (ZOFRAN-ODT) 8 MG disintegrating tablet, TAKE ONE TABLET BY MOUTH TWICE A DAY (Patient not taking: Reported on 07/04/2020), Disp: 60 tablet, Rfl: 0 .  PARoxetine (PAXIL) 20 MG tablet, Take by mouth., Disp: , Rfl:  .  PARoxetine (PAXIL) 20 MG tablet, Take 1 tablet by mouth at bedtime, Disp: 30 tablet, Rfl: 11 .  predniSONE (DELTASONE) 20 MG tablet, Take 2 tablets (40 mg total) by mouth daily with breakfast., Disp: 10 tablet, Rfl: 0 .  traZODone (DESYREL) 150 MG tablet, , Disp: , Rfl:  .  traZODone (DESYREL) 50 MG tablet, Take 1 to 3 tablets by mouth at bedtime, Disp: 90 tablet, Rfl: 11  Allergies  Allergen Reactions  . Sulfa Antibiotics     Passes out    Objective:   There were no vitals taken for this visit.  Patient is well-developed, well-nourished in no acute distress.  Resting comfortably at home.  Head is normocephalic, atraumatic.  No labored breathing.  Speech is clear and coherent with logical content.  Patient is alert and oriented at baseline.   Assessment and Plan:   1. Acute bacterial bronchitis With significant bronchospasm.  Will start Augmentin twice daily x7 days.  Cough medication refilled.  Discussed with her she needs to cut back on use of her albuterol inhaler as she is taking more than as directed, mainly for bronchospasm itself.  Increase Advair to prescribed dose of 2 puffs twice daily.  Prednisone burst is still on file.  Discussed with her she still having substantial coughing spells and chest tightness despite these changes in regimen, she needs to take the prednisone as directed.  Patient encouraged to follow-up in person if symptoms or not resolving or if new symptoms  develop.  Patient voiced understanding and agreement with the plan. - amoxicillin-clavulanate (AUGMENTIN) 875-125 MG tablet; Take 1 tablet by mouth 2 (two) times daily.  Dispense: 14 tablet; Refill: 0 - brompheniramine-pseudoephedrine-DM 30-2-10 MG/5ML syrup; Take 5 mLs by mouth 4 (four) times daily as needed.  Dispense: 120 mL; Refill: 0 .   Piedad Climes, [place] 07/26/2020

## 2020-07-27 ENCOUNTER — Other Ambulatory Visit: Payer: Self-pay | Admitting: Physician Assistant

## 2020-07-27 ENCOUNTER — Other Ambulatory Visit (HOSPITAL_COMMUNITY): Payer: Self-pay

## 2020-07-27 DIAGNOSIS — J4541 Moderate persistent asthma with (acute) exacerbation: Secondary | ICD-10-CM

## 2020-07-30 ENCOUNTER — Other Ambulatory Visit (HOSPITAL_COMMUNITY): Payer: Self-pay

## 2020-07-30 MED ORDER — PREDNISONE 20 MG PO TABS
40.0000 mg | ORAL_TABLET | Freq: Every day | ORAL | 0 refills | Status: DC
  Filled 2020-07-30: qty 10, 5d supply, fill #0

## 2020-07-30 NOTE — Addendum Note (Signed)
Addended by: Waldon Merl on: 07/30/2020 09:43 AM   Modules accepted: Orders

## 2020-08-06 ENCOUNTER — Other Ambulatory Visit (HOSPITAL_COMMUNITY): Payer: Self-pay

## 2020-08-06 ENCOUNTER — Other Ambulatory Visit: Payer: Self-pay | Admitting: Family Medicine

## 2020-08-06 ENCOUNTER — Other Ambulatory Visit: Payer: Self-pay | Admitting: Allergy

## 2020-08-06 MED ORDER — ALBUTEROL SULFATE HFA 108 (90 BASE) MCG/ACT IN AERS
2.0000 | INHALATION_SPRAY | RESPIRATORY_TRACT | 1 refills | Status: DC | PRN
  Filled 2020-08-06: qty 18, 16d supply, fill #0

## 2020-08-06 NOTE — Telephone Encounter (Signed)
Patient called requesting for Dr. Selena Batten to send in a refill for her rescue inhaler (albuterol - VENTOLIN HFA). Patient did confirm that dr. Susann Givens had sent it in previously but is now requesting Dr. Selena Batten take over that prescription.   [facility]   Best contact number: [phone]  Please advise.

## 2020-08-07 ENCOUNTER — Other Ambulatory Visit (HOSPITAL_COMMUNITY): Payer: Self-pay

## 2020-08-07 MED ORDER — ALBUTEROL SULFATE HFA 108 (90 BASE) MCG/ACT IN AERS
2.0000 | INHALATION_SPRAY | Freq: Four times a day (QID) | RESPIRATORY_TRACT | 0 refills | Status: DC | PRN
  Filled 2020-08-07: qty 1, fill #0
  Filled 2020-09-16: qty 18, 25d supply, fill #0

## 2020-08-17 ENCOUNTER — Other Ambulatory Visit (HOSPITAL_COMMUNITY): Payer: Self-pay

## 2020-08-17 MED ORDER — AMPHETAMINE-DEXTROAMPHETAMINE 20 MG PO TABS
20.0000 mg | ORAL_TABLET | Freq: Four times a day (QID) | ORAL | 0 refills | Status: DC
  Filled 2020-08-17: qty 120, 30d supply, fill #0

## 2020-09-17 ENCOUNTER — Other Ambulatory Visit (HOSPITAL_COMMUNITY): Payer: Self-pay

## 2020-09-17 MED ORDER — AMPHETAMINE-DEXTROAMPHETAMINE 20 MG PO TABS
20.0000 mg | ORAL_TABLET | Freq: Four times a day (QID) | ORAL | 0 refills | Status: DC
  Filled 2020-09-17: qty 120, 30d supply, fill #0

## 2020-09-20 ENCOUNTER — Other Ambulatory Visit (HOSPITAL_COMMUNITY): Payer: Self-pay

## 2020-10-12 DIAGNOSIS — F900 Attention-deficit hyperactivity disorder, predominantly inattentive type: Secondary | ICD-10-CM | POA: Diagnosis not present

## 2020-10-12 DIAGNOSIS — F4310 Post-traumatic stress disorder, unspecified: Secondary | ICD-10-CM | POA: Diagnosis not present

## 2020-10-15 ENCOUNTER — Other Ambulatory Visit (HOSPITAL_COMMUNITY): Payer: Self-pay

## 2020-10-15 MED ORDER — AMPHETAMINE-DEXTROAMPHETAMINE 20 MG PO TABS
20.0000 mg | ORAL_TABLET | Freq: Four times a day (QID) | ORAL | 0 refills | Status: DC
  Filled 2020-11-16: qty 120, 30d supply, fill #0

## 2020-10-15 MED ORDER — AMPHETAMINE-DEXTROAMPHETAMINE 20 MG PO TABS
20.0000 mg | ORAL_TABLET | Freq: Four times a day (QID) | ORAL | 0 refills | Status: DC
  Filled 2020-10-17: qty 120, 30d supply, fill #0

## 2020-10-15 MED ORDER — AMPHETAMINE-DEXTROAMPHETAMINE 20 MG PO TABS
20.0000 mg | ORAL_TABLET | Freq: Four times a day (QID) | ORAL | 0 refills | Status: DC
  Filled 2020-12-17: qty 120, 30d supply, fill #0

## 2020-10-16 ENCOUNTER — Other Ambulatory Visit (HOSPITAL_COMMUNITY): Payer: Self-pay

## 2020-10-17 ENCOUNTER — Other Ambulatory Visit (HOSPITAL_COMMUNITY): Payer: Self-pay

## 2020-11-04 NOTE — Progress Notes (Signed)
Follow Up Note  RE: Susan Boyle MRN: [id number] DOB: March 27, 1990 Date of Office Visit: 11/05/2020  Referring provider: Avanell Shackleton, NP-C Primary care provider: Avanell Shackleton, NP-C  Chief Complaint: Follow-up (Patient in today for follow up and reports no new problems.)  History of Present Illness: I had the pleasure of seeing Susan Boyle for a follow up visit at the [facility] on 11/05/2020. She is a 30 y.o. female, who is being followed for asthma, allergic rhinoconjunctivitis, allergic reaction. Her previous allergy office visit was on 07/04/2020 with Dr. Selena Batten. Today is a regular follow up visit.  Asthma Currently on Advair 1 puff once a day which has been helping.  She lost her albuterol a few months ago at work.  Denies any ER/urgent care visits or prednisone use since the last visit.  Allergic rhino conjunctivitis Currently taking carbinoxamine 4mg  about once a day which has been helping. Patient has not needed to use any nasal sprays or eye drops.  May be interested in AIT.   Allergic reaction No reactions since the last visit.   Assessment and Plan: Susan Boyle is a 30 y.o. female with: Asthma Past history - Diagnosed with asthma over 10+ years ago.  Currently using albuterol 1 times a week and frequently forgets to use Advair.  Main triggers include exercise and cold weather. 2022 spirometry showed some restriction with 4% improvement in FEV1 post bronchodilator treatment.  Clinically feeling unchanged. Interim history - doing much better with daily Advair.  Today's spirometry was normal. Daily controller medication(s): Advair 2023 1 puff once a day at night and rinse mouth after each use.  May use albuterol rescue inhaler 2 puffs every 4 to 6 hours as needed for shortness of breath, chest tightness, coughing, and wheezing. May use albuterol rescue inhaler 2 puffs 5 to 15 minutes prior to strenuous physical activities. Monitor frequency  of use.  Get spirometry at next visit.  Seasonal and perennial allergic rhinoconjunctivitis Past history - Perennial rhinoconjunctivitis symptoms for many years.  Tried Zyrtec, Claritin Allegra with no benefit.  Nasacort and Flonase causes headaches.  Skin testing as a child showed positive to multiple items per patient report and was on allergy immunotherapy for 2 years. 2022 skin testing showed: Positive to grass, weed pollen, ragweed pollen, trees, mold, and borderline to cat and dog.  Interim history - carbinoxamine helps.  Continue environmental control measures as below. May use carbinoxamine 4mg  (1 to 1.5 tablet) twice a day as needed for allergies. Consider allergy injections for long term control if above medications do not help the symptoms - handout given.  Let 2023 know when ready to start.  May use OTC eye drops as needed for itchy/watery eyes.  Allergic reaction Past history - 1 episode of allergic reaction in 2018 after eating a chunky clam and corn chowder canned soup at night.  The following morning developed left/facial swelling which eventually progressed to pruritus and hand/feet swelling.  She was treated in ER with epinephrine, Zantac, Benadryl and steroids with good benefit.  Patient has eaten all the ingredients that are in the canned soup since then without any issues and is currently not avoiding any foods.  No more additional allergic reaction since that event. Interim history - no reactions since the last visit. Avoid the clam & corn chowder with bacon canned soup. If you have another reaction then let us know.  For mild symptoms you can take over the counter antihistamines  such as Benadryl and monitor symptoms closely. If symptoms worsen or if you have severe symptoms including breathing issues, throat closure, significant swelling, whole body hives, severe diarrhea and vomiting, lightheadedness then inject epinephrine and seek immediate medical care afterwards. Action  plan in place.   Return in about 6 months (around 05/05/2021).  Meds ordered this encounter  Medications   albuterol (VENTOLIN HFA) 108 (90 Base) MCG/ACT inhaler    Sig: Inhale 2 puffs into the lungs every 4 (four) hours as needed for wheezing or shortness of breath (coughing fits).    Dispense:  18 g    Refill:  2    Lab Orders  No laboratory test(s) ordered today    Diagnostics: Spirometry:  Tracings reviewed. Her effort: Good reproducible efforts. FVC: 2.85L FEV1: 2.54L, 92% predicted FEV1/FVC ratio: 89% Interpretation: Spirometry consistent with normal pattern.  Please see scanned spirometry results for details.  Medication List:  Current Outpatient Medications  Medication Sig Dispense Refill   [START ON 12/16/2020] amphetamine-dextroamphetamine (ADDERALL) 20 MG tablet Take 1 tablet (20 mg total) by mouth 4 (four) times daily. 120 tablet 0   [START ON 11/16/2020] amphetamine-dextroamphetamine (ADDERALL) 20 MG tablet Take 1 tablet (20 mg total) by mouth 4 (four) times daily. 120 tablet 0   atomoxetine (STRATTERA) 60 MG capsule TAKE 1 CAPSULE BY MOUTH EVERY DAY 30 capsule 0   Carbinoxamine Maleate 4 MG TABS Take 1-1.5 tablets (4-6 mg total) by mouth 2 (two) times daily as needed for allergies. 60 tablet 5   EPINEPHrine 0.3 mg/0.3 mL IJ SOAJ injection Inject 0.3 mg into the muscle as needed for anaphylaxis. 2 each 2   Fluticasone-Salmeterol (ADVAIR) 100-50 MCG/DOSE AEPB INHALE 1 PUFF BY MOUTH 2 TIMES A DAY 60 each 3   ibuprofen (ADVIL,MOTRIN) 600 MG tablet Take 1 tablet (600 mg total) by mouth 3 (three) times daily. 30 tablet 0   levonorgestrel (MIRENA) 20 MCG/24HR IUD 1 each by Intrauterine route once.     ondansetron (ZOFRAN-ODT) 8 MG disintegrating tablet TAKE ONE TABLET BY MOUTH TWICE A DAY 60 tablet 0   PARoxetine (PAXIL) 20 MG tablet Take 1 tablet by mouth at bedtime 30 tablet 11   traZODone (DESYREL) 50 MG tablet Take 1 to 3 tablets by mouth at bedtime 90 tablet 11    albuterol (VENTOLIN HFA) 108 (90 Base) MCG/ACT inhaler Inhale 2 puffs into the lungs every 4 (four) hours as needed for wheezing or shortness of breath (coughing fits). 18 g 2   No current facility-administered medications for this visit.   Allergies: Allergies  Allergen Reactions   Sulfa Antibiotics     Passes out   I reviewed her past medical history, social history, family history, and environmental history and no significant changes have been reported from her previous visit.  Review of Systems  Constitutional:  Negative for appetite change, chills, fever and unexpected weight change.  HENT:  Negative for congestion and rhinorrhea.   Eyes:  Negative for itching.  Respiratory:  Negative for cough, chest tightness, shortness of breath and wheezing.   Cardiovascular:  Negative for chest pain.  Gastrointestinal:  Negative for abdominal pain.  Genitourinary:  Negative for difficulty urinating.  Skin:  Negative for rash.  Allergic/Immunologic: Positive for environmental allergies.  Neurological:  Negative for headaches.   Objective: BP 124/68   Pulse 66   Temp 98.3 F (36.8 C) (Temporal)   Resp 16   Ht 5\' 4"  (1.626 m)   Wt 163 lb 9.6 oz (74.2  kg)   BMI 28.08 kg/m  Body mass index is 28.08 kg/m. Physical Exam Vitals and nursing note reviewed.  Constitutional:      Appearance: Normal appearance. She is well-developed.  HENT:     Head: Normocephalic and atraumatic.     Right Ear: Tympanic membrane and external ear normal.     Left Ear: Tympanic membrane and external ear normal.     Nose: Nose normal.     Comments: Transverse nasal crease    Mouth/Throat:     Mouth: Mucous membranes are moist.     Pharynx: Oropharynx is clear.  Eyes:     Conjunctiva/sclera: Conjunctivae normal.  Cardiovascular:     Rate and Rhythm: Normal rate and regular rhythm.     Heart sounds: Normal heart sounds. No murmur heard.   No friction rub. No gallop.  Pulmonary:     Effort: Pulmonary  effort is normal.     Breath sounds: Normal breath sounds. No wheezing, rhonchi or rales.  Musculoskeletal:     Cervical back: Neck supple.  Skin:    General: Skin is warm.     Findings: No rash.  Neurological:     Mental Status: She is alert and oriented to person, place, and time.  Psychiatric:        Behavior: Behavior normal.   Previous notes and tests were reviewed. The plan was reviewed with the patient/family, and all questions/concerned were addressed.  It was my pleasure to see Susan Boyle today and participate in her care. Please feel free to contact me with any questions or concerns.  Sincerely,  Wyline Mood, DO Allergy & Immunology  [facility] office: [phone] [facility] office: [phone]

## 2020-11-05 ENCOUNTER — Other Ambulatory Visit (HOSPITAL_COMMUNITY): Payer: Self-pay

## 2020-11-05 ENCOUNTER — Encounter: Payer: Self-pay | Admitting: Allergy

## 2020-11-05 ENCOUNTER — Other Ambulatory Visit: Payer: Self-pay

## 2020-11-05 ENCOUNTER — Ambulatory Visit: Payer: 59 | Admitting: Allergy

## 2020-11-05 VITALS — BP 124/68 | HR 66 | Temp 98.3°F | Resp 16 | Ht 64.0 in | Wt 163.6 lb

## 2020-11-05 DIAGNOSIS — H1013 Acute atopic conjunctivitis, bilateral: Secondary | ICD-10-CM

## 2020-11-05 DIAGNOSIS — J302 Other seasonal allergic rhinitis: Secondary | ICD-10-CM | POA: Diagnosis not present

## 2020-11-05 DIAGNOSIS — J3089 Other allergic rhinitis: Secondary | ICD-10-CM

## 2020-11-05 DIAGNOSIS — T7840XD Allergy, unspecified, subsequent encounter: Secondary | ICD-10-CM

## 2020-11-05 DIAGNOSIS — J4540 Moderate persistent asthma, uncomplicated: Secondary | ICD-10-CM

## 2020-11-05 DIAGNOSIS — H1010 Acute atopic conjunctivitis, unspecified eye: Secondary | ICD-10-CM

## 2020-11-05 MED ORDER — ALBUTEROL SULFATE HFA 108 (90 BASE) MCG/ACT IN AERS
2.0000 | INHALATION_SPRAY | RESPIRATORY_TRACT | 2 refills | Status: DC | PRN
  Filled 2020-11-05: qty 18, 17d supply, fill #0
  Filled 2021-01-14: qty 18, 16d supply, fill #0
  Filled 2021-02-14: qty 18, 16d supply, fill #1
  Filled 2021-08-28: qty 18, 16d supply, fill #2

## 2020-11-05 NOTE — Assessment & Plan Note (Signed)
Past history - 1 episode of allergic reaction in 2018 after eating a chunky clam and corn chowder canned soup at night.  The following morning developed left/facial swelling which eventually progressed to pruritus and hand/feet swelling.  She was treated in ER with epinephrine, Zantac, Benadryl and steroids with good benefit.  Patient has eaten all the ingredients that are in the canned soup since then without any issues and is currently not avoiding any foods.  No more additional allergic reaction since that event. Interim history - no reactions since the last visit.  Avoid the clam & corn chowder with bacon canned soup.  If you have another reaction then let us know.   For mild symptoms you can take over the counter antihistamines such as Benadryl and monitor symptoms closely. If symptoms worsen or if you have severe symptoms including breathing issues, throat closure, significant swelling, whole body hives, severe diarrhea and vomiting, lightheadedness then inject epinephrine and seek immediate medical care afterwards.  Action plan in place.

## 2020-11-05 NOTE — Patient Instructions (Addendum)
Asthma: Daily controller medication(s): Advair 1 puff once a day at night and rinse mouth after each use.  May use albuterol rescue inhaler 2 puffs every 4 to 6 hours as needed for shortness of breath, chest tightness, coughing, and wheezing. May use albuterol rescue inhaler 2 puffs 5 to 15 minutes prior to strenuous physical activities. Monitor frequency of use.  Asthma control goals:  Full participation in all desired activities (may need albuterol before activity) Albuterol use two times or less a week on average (not counting use with activity) Cough interfering with sleep two times or less a month Oral steroids no more than once a year No hospitalizations  Environmental allergies 2022 skin testing showed: Positive to grass, weed pollen, ragweed pollen, trees, mold, and borderline to cat and dog.  Continue environmental control measures as below. May use carbinoxamine 4mg  (1 to 1.5 tablet) twice a day as needed for allergies. Consider allergy injections for long term control if above medications do not help the symptoms - handout given.  Let know when ready to start.  May use OTC eye drops as needed for itchy/watery eyes.  Allergic reaction: Avoid the clam & corn chowder with bacon canned soup. If you have another reaction then let us know.  For mild symptoms you can take over the counter antihistamines such as Benadryl and monitor symptoms closely. If symptoms worsen or if you have severe symptoms including breathing issues, throat closure, significant swelling, whole body hives, severe diarrhea and vomiting, lightheadedness then inject epinephrine and seek immediate medical care afterwards. Action plan in place.   Follow up in 6 months or sooner if needed.   Reducing Pollen Exposure Pollen seasons: trees (spring), grass (summer) and ragweed/weeds (fall). Keep windows closed in your home and car to lower pollen exposure.  Install air conditioning in the bedroom and  throughout the house if possible.  Avoid going out in dry windy days - especially early morning. Pollen counts are highest between 5 - 10 AM and on dry, hot and windy days.  Save outside activities for late afternoon or after a heavy rain, when pollen levels are lower.  Avoid mowing of grass if you have grass pollen allergy. Be aware that pollen can also be transported indoors on people and pets.  Dry your clothes in an automatic dryer rather than hanging them outside where they might collect pollen.  Rinse hair and eyes before bedtime. Mold Control Mold and fungi can grow on a variety of surfaces provided certain temperature and moisture conditions exist.  Outdoor molds grow on plants, decaying vegetation and soil. The major outdoor mold, Alternaria and Cladosporium, are found in very high numbers during hot and dry conditions. Generally, a late summer - fall peak is seen for common outdoor fungal spores. Rain will temporarily lower outdoor mold spore count, but counts rise rapidly when the rainy period ends. The most important indoor molds are Aspergillus and Penicillium. Dark, humid and poorly ventilated basements are ideal sites for mold growth. The next most common sites of mold growth are the bathroom and the kitchen. Outdoor (Seasonal) Mold Control Use air conditioning and keep windows closed. Avoid exposure to decaying vegetation. Avoid leaf raking. Avoid grain handling. Consider wearing a face mask if working in moldy areas.  Indoor (Perennial) Mold Control  Maintain humidity below 50%. Get rid of mold growth on hard surfaces with water, detergent and, if necessary, 5% bleach (do not mix with other cleaners). Then dry the area completely. If  mold covers an area more than 10 square feet, consider hiring an indoor environmental professional. For clothing, washing with soap and water is best. If moldy items cannot be cleaned and dried, throw them away. Remove sources e.g. contaminated  carpets. Repair and seal leaking roofs or pipes. Using dehumidifiers in damp basements may be helpful, but empty the water and clean units regularly to prevent mildew from forming. All rooms, especially basements, bathrooms and kitchens, require ventilation and cleaning to deter mold and mildew growth. Avoid carpeting on concrete or damp floors, and storing items in damp areas. Pet Allergen Avoidance: Contrary to popular opinion, there are no "hypoallergenic" breeds of dogs or cats. That is because people are not allergic to an animal's hair, but to an allergen found in the animal's saliva, dander (dead skin flakes) or urine. Pet allergy symptoms typically occur within minutes. For some people, symptoms can build up and become most severe 8 to 12 hours after contact with the animal. People with severe allergies can experience reactions in public places if dander has been transported on the pet owners' clothing. Keeping an animal outdoors is only a partial solution, since homes with pets in the yard still have higher concentrations of animal allergens. Before getting a pet, ask your allergist to determine if you are allergic to animals. If your pet is already considered part of your family, try to minimize contact and keep the pet out of the bedroom and other rooms where you spend a great deal of time. As with dust mites, vacuum carpets often or replace carpet with a hardwood floor, tile or linoleum. High-efficiency particulate air (HEPA) cleaners can reduce allergen levels over time. While dander and saliva are the source of cat and dog allergens, urine is the source of allergens from rabbits, hamsters, mice and [place] pigs; so ask a non-allergic family member to clean the animal's cage. If you have a pet allergy, talk to your allergist about the potential for allergy immunotherapy (allergy shots). This strategy can often provide long-term relief.

## 2020-11-05 NOTE — Assessment & Plan Note (Signed)
Past history - Diagnosed with asthma over 10+ years ago.  Currently using albuterol 1 times a week and frequently forgets to use Advair.  Main triggers include exercise and cold weather. 2022 spirometry showed some restriction with 4% improvement in FEV1 post bronchodilator treatment.  Clinically feeling unchanged. Interim history - doing much better with daily Advair.   Today's spirometry was normal. . Daily controller medication(s): Advair 1 puff once a day at night and rinse mouth after each use.  . May use albuterol rescue inhaler 2 puffs every 4 to 6 hours as needed for shortness of breath, chest tightness, coughing, and wheezing. May use albuterol rescue inhaler 2 puffs 5 to 15 minutes prior to strenuous physical activities. Monitor frequency of use.   Get spirometry at next visit.

## 2020-11-05 NOTE — Assessment & Plan Note (Signed)
Past history - Perennial rhinoconjunctivitis symptoms for many years.  Tried Zyrtec, Claritin Allegra with no benefit.  Nasacort and Flonase causes headaches.  Skin testing as a child showed positive to multiple items per patient report and was on allergy immunotherapy for 2 years. 2022 skin testing showed: Positive to grass, weed pollen, ragweed pollen, trees, mold, and borderline to cat and dog.  Interim history - carbinoxamine helps.   Continue environmental control measures as below.  May use carbinoxamine 4mg  (1 to 1.5 tablet) twice a day as needed for allergies.  Consider allergy injections for long term control if above medications do not help the symptoms - handout given.   Let know when ready to start.   May use OTC eye drops as needed for itchy/watery eyes.

## 2020-11-13 ENCOUNTER — Other Ambulatory Visit (HOSPITAL_COMMUNITY): Payer: Self-pay

## 2020-11-14 ENCOUNTER — Telehealth: Payer: 59 | Admitting: Physician Assistant

## 2020-11-14 DIAGNOSIS — H60332 Swimmer's ear, left ear: Secondary | ICD-10-CM

## 2020-11-14 MED ORDER — NEOMYCIN-POLYMYXIN-HC 3.5-10000-1 OT SOLN
4.0000 [drp] | Freq: Four times a day (QID) | OTIC | 0 refills | Status: AC
  Filled 2020-11-14: qty 10, 13d supply, fill #0

## 2020-11-14 NOTE — Progress Notes (Signed)
E Visit for Swimmer's Ear ? ?We are sorry that you are not feeling well. Here is how we plan to help! ? ?I have prescribed: Neomycin 0.35%, polymyxin B 10,000 units/mL, and hydrocortisone 0,5% otic solution 4 drops in affected ears four times a day for 7 days ? ?In certain cases swimmer's ear may progress to a more serious bacterial infection of the middle or inner ear.  If you have a fever 102 and up and significantly worsening symptoms, this could indicate a more serious infection moving to the middle/inner and needs face to face evaluation in an office by a provider. ? ?Your symptoms should improve over the next 3 days and should resolve in about 7 days. ? ?HOME CARE: ? ?Wash your hands frequently. ?Do not place the tip of the bottle on your ear or touch it with your fingers. ?You can take Acetominophen 650 mg every 4-6 hours as needed for pain.  If pain is severe or moderate, you can apply a heating pad (set on low) or hot water bottle (wrapped in a towel) to outer ear for 20 minutes.  This will also increase drainage. ?Avoid ear plugs ?Do not use Q-tips ?After showers, help the water run out by tilting your head to one side. ? ?GET HELP RIGHT AWAY IF: ? ?Fever is over 102.2 degrees. ?You develop progressive ear pain or hearing loss. ?Ear symptoms persist longer than 3 days after treatment. ? ?MAKE SURE YOU: ? ?Understand these instructions. ?Will watch your condition. ?Will get help right away if you are not doing well or get worse. ? ?TO PREVENT SWIMMER'S EAR: ?Use a bathing cap or custom fitted swim molds to keep your ears dry. ?Towel off after swimming to dry your ears. ?Tilt your head or pull your earlobes to allow the water to escape your ear canal. ?If there is still water in your ears, consider using a hairdryer on the lowest setting. ? ? ?Thank you for choosing an e-visit. ? ?Your e-visit answers were reviewed by a board certified advanced clinical practitioner to complete your personal care plan.  Depending upon the condition, your plan could have included both over the counter or prescription medications. ? ?Please review your pharmacy choice. Make sure the pharmacy is open so you can pick up prescription now. If there is a problem, you may contact your provider through MyChart messaging and have the prescription routed to another pharmacy.  Your safety is important to us. If you have drug allergies check your prescription carefully.  ? ?For the next 24 hours you can use MyChart to ask questions about today's visit, request a non-urgent call back, or ask for a work or school excuse. ?You will get an email in the next two days asking about your experience. I hope that your e-visit has been valuable and will speed your recovery. ? ?  ?

## 2020-11-14 NOTE — Progress Notes (Signed)
I have spent 5 minutes in review of e-visit questionnaire, review and updating patient chart, medical decision making and response to patient.   Lorne Winkels Cody Arling Cerone, PA-C    

## 2020-11-15 ENCOUNTER — Other Ambulatory Visit (HOSPITAL_COMMUNITY): Payer: Self-pay

## 2020-11-16 ENCOUNTER — Other Ambulatory Visit (HOSPITAL_COMMUNITY): Payer: Self-pay

## 2020-11-20 ENCOUNTER — Other Ambulatory Visit: Payer: Self-pay | Admitting: Allergy

## 2020-11-20 DIAGNOSIS — J3089 Other allergic rhinitis: Secondary | ICD-10-CM

## 2020-11-21 DIAGNOSIS — J302 Other seasonal allergic rhinitis: Secondary | ICD-10-CM | POA: Diagnosis not present

## 2020-11-21 NOTE — Progress Notes (Signed)
Aeroallergen Immunotherapy   Ordering Provider: Dr. Wyline Mood   Patient Details  Name: Susan Boyle  MRN: [id number]  Date of Birth: November 06, 1990   Order 1 of 2   Vial Label: G-Rw-W-T   0.3 ml (Volume)  BAU Concentration -- 7 Grass Mix* 100,000 ([address] [place], [place], [place], [place], [place], [place], [place])  0.2 ml (Volume)  1:20 Concentration -- [place]  0.3 ml (Volume)  BAU Concentration -- [place] 10,000  0.2 ml (Volume)  1:20 Concentration -- [place]  0.3 ml (Volume)  1:20 Concentration -- Ragweed Mix  0.5 ml (Volume)  1:20 Concentration -- Weed Mix*  0.5 ml (Volume)  1:20 Concentration -- Eastern 10 Tree Mix (also Sweet Gum)  0.2 ml (Volume)  1:20 Concentration -- [place] Elder    2.5  ml Extract Subtotal  2.5  ml Diluent  5.0  ml Maintenance Total   Schedule:  B  Blue Vial (1:100,000): Schedule B (6 doses)  Yellow Vial (1:10,000): Schedule B (6 doses)  Green Vial (1:1,000): Schedule B (6 doses)  Red Vial (1:100): Schedule A (14 doses)   Special Instructions: once per week

## 2020-11-21 NOTE — Progress Notes (Signed)
Aeroallergen Immunotherapy   Ordering Provider: Dr. Wyline Mood   Patient Details  Name: Susan Boyle  MRN: [id number]  Date of Birth: [birth year]   Order 2 of 2   Vial Label: M-C-D   0.2 ml (Volume)  1:20 Concentration -- Alternaria alternata  0.2 ml (Volume)  1:20 Concentration -- Cladosporium herbarum  0.2 ml (Volume)  1:10 Concentration -- Aspergillus mix  0.2 ml (Volume)  1:10 Concentration -- Penicillium mix  0.2 ml (Volume)  1:20 Concentration -- Bipolaris sorokiniana  0.2 ml (Volume)  1:20 Concentration -- Drechslera spicifera  0.2 ml (Volume)  1:10 Concentration -- Mucor plumbeus  0.2 ml (Volume)  1:10 Concentration -- Fusarium moniliforme  0.2 ml (Volume)  1:40 Concentration -- Aureobasidium pullulans  0.2 ml (Volume)  1:10 Concentration -- Rhizopus oryzae  0.5 ml (Volume)  1:10 Concentration -- Cat Hair  0.5 ml (Volume)  1:10 Concentration -- Dog Epithelia    3.0  ml Extract Subtotal  2.0  ml Diluent  5.0  ml Maintenance Total   Schedule:  B  Blue Vial (1:100,000): Schedule B (6 doses)  Yellow Vial (1:10,000): Schedule B (6 doses)  Green Vial (1:1,000): Schedule B (6 doses)  Red Vial (1:100): Schedule A (14 doses)   Special Instructions: once per week

## 2020-11-21 NOTE — Progress Notes (Signed)
VIALS MADE. EXP 11-21-21 

## 2020-11-22 DIAGNOSIS — J3081 Allergic rhinitis due to animal (cat) (dog) hair and dander: Secondary | ICD-10-CM | POA: Diagnosis not present

## 2020-12-04 ENCOUNTER — Other Ambulatory Visit: Payer: Self-pay

## 2020-12-04 ENCOUNTER — Ambulatory Visit (INDEPENDENT_AMBULATORY_CARE_PROVIDER_SITE_OTHER): Payer: 59

## 2020-12-04 DIAGNOSIS — J309 Allergic rhinitis, unspecified: Secondary | ICD-10-CM

## 2020-12-04 NOTE — Progress Notes (Signed)
Immunotherapy   Patient Details  Name: Susan Boyle MRN: [id number] Date of Birth: 29-Jun-1990  12/04/2020  [name] started injections for G-RW-W-T and M-C-D. Patient received 0.05 ml out of both her blue vials with an expiration of 11/21/2021. Patient waited in the office for 30 minutes with no problems. Following schedule: B  Frequency:1 time per week Epi-Pen:Epi-Pen Available  Consent signed and patient instructions given.   EKATERINI CAPITANO 12/04/2020, 11:41 AM

## 2020-12-07 ENCOUNTER — Other Ambulatory Visit (HOSPITAL_COMMUNITY): Payer: Self-pay

## 2020-12-07 ENCOUNTER — Other Ambulatory Visit: Payer: Self-pay

## 2020-12-07 ENCOUNTER — Encounter: Payer: Self-pay | Admitting: Allergy

## 2020-12-07 MED ORDER — FLUTICASONE-SALMETEROL 100-50 MCG/ACT IN AEPB
1.0000 | INHALATION_SPRAY | Freq: Every day | RESPIRATORY_TRACT | 5 refills | Status: DC
  Filled 2020-12-07: qty 60, 60d supply, fill #0

## 2020-12-12 ENCOUNTER — Ambulatory Visit (INDEPENDENT_AMBULATORY_CARE_PROVIDER_SITE_OTHER): Payer: 59 | Admitting: *Deleted

## 2020-12-12 DIAGNOSIS — J309 Allergic rhinitis, unspecified: Secondary | ICD-10-CM

## 2020-12-14 ENCOUNTER — Other Ambulatory Visit (HOSPITAL_COMMUNITY): Payer: Self-pay

## 2020-12-17 ENCOUNTER — Other Ambulatory Visit (HOSPITAL_COMMUNITY): Payer: Self-pay

## 2020-12-18 ENCOUNTER — Ambulatory Visit (INDEPENDENT_AMBULATORY_CARE_PROVIDER_SITE_OTHER): Payer: 59 | Admitting: *Deleted

## 2020-12-18 DIAGNOSIS — J309 Allergic rhinitis, unspecified: Secondary | ICD-10-CM | POA: Diagnosis not present

## 2020-12-28 ENCOUNTER — Ambulatory Visit (INDEPENDENT_AMBULATORY_CARE_PROVIDER_SITE_OTHER): Payer: 59

## 2020-12-28 DIAGNOSIS — J309 Allergic rhinitis, unspecified: Secondary | ICD-10-CM | POA: Diagnosis not present

## 2021-01-02 ENCOUNTER — Ambulatory Visit (INDEPENDENT_AMBULATORY_CARE_PROVIDER_SITE_OTHER): Payer: 59

## 2021-01-02 DIAGNOSIS — J309 Allergic rhinitis, unspecified: Secondary | ICD-10-CM | POA: Diagnosis not present

## 2021-01-07 ENCOUNTER — Ambulatory Visit (INDEPENDENT_AMBULATORY_CARE_PROVIDER_SITE_OTHER): Payer: 59

## 2021-01-07 DIAGNOSIS — J309 Allergic rhinitis, unspecified: Secondary | ICD-10-CM

## 2021-01-11 ENCOUNTER — Other Ambulatory Visit (HOSPITAL_COMMUNITY): Payer: Self-pay

## 2021-01-14 ENCOUNTER — Telehealth: Payer: Self-pay

## 2021-01-14 ENCOUNTER — Other Ambulatory Visit (HOSPITAL_COMMUNITY): Payer: Self-pay

## 2021-01-14 MED ORDER — AMPHETAMINE-DEXTROAMPHETAMINE 20 MG PO TABS
20.0000 mg | ORAL_TABLET | Freq: Four times a day (QID) | ORAL | 0 refills | Status: DC
  Filled 2021-01-15: qty 120, 30d supply, fill #0

## 2021-01-14 MED ORDER — FLUTICASONE-SALMETEROL 250-50 MCG/ACT IN AEPB
1.0000 | INHALATION_SPRAY | Freq: Two times a day (BID) | RESPIRATORY_TRACT | 5 refills | Status: DC
  Filled 2021-01-14: qty 60, 30d supply, fill #0
  Filled 2021-02-14: qty 60, 30d supply, fill #1

## 2021-01-14 NOTE — Telephone Encounter (Signed)
Patient called to request a change in her inhaler. Her asthma is worse since it has been cold.  She would like to change back to Advair 250.   [facility]

## 2021-01-14 NOTE — Telephone Encounter (Signed)
Done

## 2021-01-14 NOTE — Telephone Encounter (Signed)
Dr. Selena Batten please advise if it is okay to switch.

## 2021-01-15 ENCOUNTER — Ambulatory Visit (INDEPENDENT_AMBULATORY_CARE_PROVIDER_SITE_OTHER): Payer: 59

## 2021-01-15 ENCOUNTER — Other Ambulatory Visit (HOSPITAL_COMMUNITY): Payer: Self-pay

## 2021-01-15 DIAGNOSIS — J309 Allergic rhinitis, unspecified: Secondary | ICD-10-CM | POA: Diagnosis not present

## 2021-01-21 ENCOUNTER — Ambulatory Visit (INDEPENDENT_AMBULATORY_CARE_PROVIDER_SITE_OTHER): Payer: 59

## 2021-01-21 DIAGNOSIS — J309 Allergic rhinitis, unspecified: Secondary | ICD-10-CM | POA: Diagnosis not present

## 2021-01-24 ENCOUNTER — Telehealth: Payer: 59 | Admitting: Family Medicine

## 2021-01-24 ENCOUNTER — Other Ambulatory Visit (HOSPITAL_COMMUNITY): Payer: Self-pay

## 2021-01-24 DIAGNOSIS — J069 Acute upper respiratory infection, unspecified: Secondary | ICD-10-CM | POA: Diagnosis not present

## 2021-01-24 MED ORDER — FLUTICASONE PROPIONATE 50 MCG/ACT NA SUSP
2.0000 | Freq: Every day | NASAL | 0 refills | Status: DC
  Filled 2021-01-24: qty 16, 30d supply, fill #0

## 2021-01-24 MED ORDER — PSEUDOEPH-BROMPHEN-DM 30-2-10 MG/5ML PO SYRP
2.5000 mL | ORAL_SOLUTION | Freq: Three times a day (TID) | ORAL | 0 refills | Status: DC | PRN
  Filled 2021-01-24: qty 118, 16d supply, fill #0

## 2021-01-24 NOTE — Progress Notes (Signed)

## 2021-01-24 NOTE — Addendum Note (Signed)
Addended by: Freddy Finner on: 01/24/2021 01:15 PM   Modules accepted: Orders

## 2021-01-30 ENCOUNTER — Ambulatory Visit (INDEPENDENT_AMBULATORY_CARE_PROVIDER_SITE_OTHER): Payer: 59

## 2021-01-30 DIAGNOSIS — J309 Allergic rhinitis, unspecified: Secondary | ICD-10-CM | POA: Diagnosis not present

## 2021-02-01 ENCOUNTER — Other Ambulatory Visit (HOSPITAL_COMMUNITY): Payer: Self-pay

## 2021-02-12 ENCOUNTER — Ambulatory Visit (INDEPENDENT_AMBULATORY_CARE_PROVIDER_SITE_OTHER): Payer: 59 | Admitting: *Deleted

## 2021-02-12 DIAGNOSIS — J309 Allergic rhinitis, unspecified: Secondary | ICD-10-CM

## 2021-02-14 ENCOUNTER — Other Ambulatory Visit (HOSPITAL_COMMUNITY): Payer: Self-pay

## 2021-02-14 MED ORDER — AMPHETAMINE-DEXTROAMPHETAMINE 20 MG PO TABS
20.0000 mg | ORAL_TABLET | Freq: Four times a day (QID) | ORAL | 0 refills | Status: DC
  Filled 2021-02-14: qty 120, 30d supply, fill #0

## 2021-02-16 ENCOUNTER — Telehealth: Payer: 59 | Admitting: Nurse Practitioner

## 2021-02-16 DIAGNOSIS — U071 COVID-19: Secondary | ICD-10-CM

## 2021-02-16 MED ORDER — MOLNUPIRAVIR EUA 200MG CAPSULE: 4.0000 | ORAL_CAPSULE | Freq: Two times a day (BID) | ORAL | 0 refills | Status: AC

## 2021-02-16 NOTE — Progress Notes (Signed)
Virtual Visit Consent   Susan Boyle, you are scheduled for a virtual visit with a Susan Boyle provider today.     Just as with appointments in the office, your consent must be obtained to participate.  Your consent will be active for this visit and any virtual visit you may have with one of our providers in the next 365 days.     If you have a MyChart account, a copy of this consent can be sent to you electronically.  All virtual visits are billed to your insurance company just like a traditional visit in the office.    As this is a virtual visit, video technology does not allow for your provider to perform a traditional examination.  This may limit your provider's ability to fully assess your condition.  If your provider identifies any concerns that need to be evaluated in person or the need to arrange testing (such as labs, EKG, etc.), we will make arrangements to do so.     Although advances in technology are sophisticated, we cannot ensure that it will always work on either your end or our end.  If the connection with a video visit is poor, the visit may have to be switched to a telephone visit.  With either a video or telephone visit, we are not always able to ensure that we have a secure connection.     I need to obtain your verbal consent now.   Are you willing to proceed with your visit today?    [name] has provided verbal consent on 02/16/2021 for a virtual visit (video or telephone).   Viviano Simas, FNP   Date: 02/16/2021 1:08 PM   Virtual Visit via Video Note   I, Viviano Simas, connected with  Susan Boyle  ([id number], [birth year]) on 02/16/21 at  1:15 PM EST by a video-enabled telemedicine application and verified that I am speaking with the correct person using two identifiers.  Location: Patient: Virtual Visit Location Patient: Home Provider: Virtual Visit Location Provider: Home Office   I discussed the limitations of evaluation and management by  telemedicine and the availability of in person appointments. The patient expressed understanding and agreed to proceed.    History of Present Illness: Susan Boyle is a 31 y.o. who identifies as a female who was assigned female at birth, and is being seen today after testing positive for COVID today She has symptom onset yesterday with sore throat and headache, she has a cough today and worsening headache.   She does have a fever as well.   She has had COVID in the past 04/2018  She has been vaccinated X3 for COVID  Most recent booster was 6 months ago   She has a history of asthma-  Uses Advair daily   Problems:  Patient Active Problem List   Diagnosis Date Noted   Seasonal and perennial allergic rhinoconjunctivitis 11/05/2020   Allergic reaction 07/04/2020   Food allergy 05/02/2020   Environmental allergies 05/02/2020   Dermatographism 05/02/2020   Asthma    Lateral epicondylitis of right elbow 09/26/2015   Subacromial bursitis 08/15/2015    Allergies:  Allergies  Allergen Reactions   Sulfa Antibiotics     Passes out   Medications:  Current Outpatient Medications:    albuterol (VENTOLIN HFA) 108 (90 Base) MCG/ACT inhaler, Inhale 2 puffs into the lungs every 4 (four) hours as needed for wheezing or shortness of breath (coughing fits)., Disp: 18 g, Rfl: 2  amphetamine-dextroamphetamine (ADDERALL) 20 MG tablet, Take 1 tablet (20 mg total) by mouth 4 (four) times daily., Disp: 120 tablet, Rfl: 0   amphetamine-dextroamphetamine (ADDERALL) 20 MG tablet, Take 1 tablet (20 mg total) by mouth 4 (four) times daily., Disp: 120 tablet, Rfl: 0   amphetamine-dextroamphetamine (ADDERALL) 20 MG tablet, Take 1 tablet (20 mg total) by mouth 4 (four) times daily., Disp: 120 tablet, Rfl: 0   atomoxetine (STRATTERA) 60 MG capsule, TAKE 1 CAPSULE BY MOUTH EVERY DAY, Disp: 30 capsule, Rfl: 0   brompheniramine-pseudoephedrine-DM 30-2-10 MG/5ML syrup, Take 2.5 mLs by mouth 3 (three) times daily  as needed., Disp: 118 mL, Rfl: 0   Carbinoxamine Maleate 4 MG TABS, Take 1-1.5 tablets (4-6 mg total) by mouth 2 (two) times daily as needed for allergies., Disp: 60 tablet, Rfl: 5   EPINEPHrine 0.3 mg/0.3 mL IJ SOAJ injection, Inject 0.3 mg into the muscle as needed for anaphylaxis., Disp: 2 each, Rfl: 2   fluticasone-salmeterol (ADVAIR DISKUS) 250-50 MCG/ACT AEPB, Inhale 1 puff into the lungs in the morning and at bedtime. Rinse mouth after each use., Disp: 60 each, Rfl: 5   ibuprofen (ADVIL,MOTRIN) 600 MG tablet, Take 1 tablet (600 mg total) by mouth 3 (three) times daily., Disp: 30 tablet, Rfl: 0   levonorgestrel (MIRENA) 20 MCG/24HR IUD, 1 each by Intrauterine route once., Disp: , Rfl:    ondansetron (ZOFRAN-ODT) 8 MG disintegrating tablet, TAKE ONE TABLET BY MOUTH TWICE A DAY, Disp: 60 tablet, Rfl: 0   PARoxetine (PAXIL) 20 MG tablet, Take 1 tablet by mouth at bedtime, Disp: 30 tablet, Rfl: 11   traZODone (DESYREL) 50 MG tablet, Take 1 to 3 tablets by mouth at bedtime, Disp: 90 tablet, Rfl: 11  Observations/Objective: Patient is well-developed, well-nourished in no acute distress.  Resting comfortably at home.  Head is normocephalic, atraumatic.  No labored breathing.  Speech is clear and coherent with logical content.  Patient is alert and oriented at baseline.    Assessment and Plan: 1. COVID-19 Take anti-viral with food  - molnupiravir EUA (LAGEVRIO) 200 mg CAPS capsule; Take 4 capsules (800 mg total) by mouth 2 (two) times daily for 5 days.  Dispense: 40 capsule; Refill: 0    Continue to manage with OTC medications as discussed Rotate advil and tylenol  Mucinex for cough   Follow Up Instructions: I discussed the assessment and treatment plan with the patient. The patient was provided an opportunity to ask questions and all were answered. The patient agreed with the plan and demonstrated an understanding of the instructions.  A copy of instructions were sent to the patient via  MyChart unless otherwise noted below.     The patient was advised to call back or seek an in-person evaluation if the symptoms worsen or if the condition fails to improve as anticipated.  Time:  I spent 15 minutes with the patient via telehealth technology discussing the above problems/concerns.    Viviano Simas, FNP

## 2021-02-22 ENCOUNTER — Other Ambulatory Visit (HOSPITAL_COMMUNITY): Payer: Self-pay

## 2021-02-25 ENCOUNTER — Ambulatory Visit (INDEPENDENT_AMBULATORY_CARE_PROVIDER_SITE_OTHER): Payer: Self-pay

## 2021-02-25 DIAGNOSIS — J309 Allergic rhinitis, unspecified: Secondary | ICD-10-CM

## 2021-03-02 ENCOUNTER — Telehealth: Payer: 59 | Admitting: Nurse Practitioner

## 2021-03-02 DIAGNOSIS — H1031 Unspecified acute conjunctivitis, right eye: Secondary | ICD-10-CM

## 2021-03-02 MED ORDER — POLYMYXIN B-TRIMETHOPRIM 10000-0.1 UNIT/ML-% OP SOLN: 1.0000 [drp] | OPHTHALMIC | 0 refills | Status: AC

## 2021-03-02 NOTE — Progress Notes (Signed)
Virtual Visit Consent   ECHO PROPP, you are scheduled for a virtual visit with a [facility] provider today.     Just as with appointments in the office, your consent must be obtained to participate.  Your consent will be active for this visit and any virtual visit you may have with one of our providers in the next 365 days.     If you have a MyChart account, a copy of this consent can be sent to you electronically.  All virtual visits are billed to your insurance company just like a traditional visit in the office.    As this is a virtual visit, video technology does not allow for your provider to perform a traditional examination.  This may limit your provider's ability to fully assess your condition.  If your provider identifies any concerns that need to be evaluated in person or the need to arrange testing (such as labs, EKG, etc.), we will make arrangements to do so.     Although advances in technology are sophisticated, we cannot ensure that it will always work on either your end or our end.  If the connection with a video visit is poor, the visit may have to be switched to a telephone visit.  With either a video or telephone visit, we are not always able to ensure that we have a secure connection.     I need to obtain your verbal consent now.   Are you willing to proceed with your visit today?    Susan Boyle has provided verbal consent on 03/02/2021 for a virtual visit (video or telephone).   Viviano Simas, FNP   Date: 03/02/2021 12:44 PM   Virtual Visit via Video Note   I, Viviano Simas, connected with  Susan Boyle  ([id number], 1990-03-26) on 03/02/21 at 12:45 PM EST by a video-enabled telemedicine application and verified that I am speaking with the correct person using two identifiers.  Location: Patient: Virtual Visit Location Patient: Home Provider: Virtual Visit Location Provider: Home Office   I discussed the limitations of evaluation and management by telemedicine  and the availability of in person appointments. The patient expressed understanding and agreed to proceed.    History of Present Illness: Susan Boyle is a 31 y.o. who identifies as a female who was assigned female at birth, and is being seen today with complaints of irritation to her eyes after her dog was on her face last night. Her right eye was red when she woke up with crusted drainage. It was worse this am, she tried Patanol that she had thinking it was allergies but that burned her eye.   She denies contact use.  She does have some nasal congestion and has taken allergy congestion for that as well.   Denies a fever.   Problems:  Patient Active Problem List   Diagnosis Date Noted   Seasonal and perennial allergic rhinoconjunctivitis 11/05/2020   Allergic reaction 07/04/2020   Food allergy 05/02/2020   Environmental allergies 05/02/2020   Dermatographism 05/02/2020   Asthma    Lateral epicondylitis of right elbow 09/26/2015   Subacromial bursitis 08/15/2015    Allergies: No Active Allergies Medications:  Current Outpatient Medications:    albuterol (VENTOLIN HFA) 108 (90 Base) MCG/ACT inhaler, Inhale 2 puffs into the lungs every 4 (four) hours as needed for wheezing or shortness of breath (coughing fits)., Disp: 18 g, Rfl: 2   amphetamine-dextroamphetamine (ADDERALL) 20 MG tablet, Take 1 tablet (20 mg  total) by mouth 4 (four) times daily., Disp: 120 tablet, Rfl: 0   amphetamine-dextroamphetamine (ADDERALL) 20 MG tablet, Take 1 tablet (20 mg total) by mouth 4 (four) times daily., Disp: 120 tablet, Rfl: 0   amphetamine-dextroamphetamine (ADDERALL) 20 MG tablet, Take 1 tablet (20 mg total) by mouth 4 (four) times daily., Disp: 120 tablet, Rfl: 0   Carbinoxamine Maleate 4 MG TABS, Take 1-1.5 tablets (4-6 mg total) by mouth 2 (two) times daily as needed for allergies., Disp: 60 tablet, Rfl: 5   EPINEPHrine 0.3 mg/0.3 mL IJ SOAJ injection, Inject 0.3 mg into the muscle as needed for  anaphylaxis., Disp: 2 each, Rfl: 2   fluticasone-salmeterol (ADVAIR DISKUS) 250-50 MCG/ACT AEPB, Inhale 1 puff into the lungs in the morning and at bedtime. Rinse mouth after each use., Disp: 60 each, Rfl: 5   ibuprofen (ADVIL,MOTRIN) 600 MG tablet, Take 1 tablet (600 mg total) by mouth 3 (three) times daily., Disp: 30 tablet, Rfl: 0   levonorgestrel (MIRENA) 20 MCG/24HR IUD, 1 each by Intrauterine route once., Disp: , Rfl:    ondansetron (ZOFRAN-ODT) 8 MG disintegrating tablet, TAKE ONE TABLET BY MOUTH TWICE A DAY, Disp: 60 tablet, Rfl: 0   PARoxetine (PAXIL) 20 MG tablet, Take 1 tablet by mouth at bedtime, Disp: 30 tablet, Rfl: 11   traZODone (DESYREL) 50 MG tablet, Take 1 to 3 tablets by mouth at bedtime, Disp: 90 tablet, Rfl: 11  Observations/Objective: Patient is well-developed, well-nourished in no acute distress.  Resting comfortably at home.  Head is normocephalic, atraumatic.  No labored breathing.  Speech is clear and coherent with logical content.  Patient is alert and oriented at baseline.  Right sclera erythematous with conjunctiva notably injected no active drainage PERRL, EOMI  Assessment and Plan: 1. Acute bacterial conjunctivitis of right eye  - trimethoprim-polymyxin b (POLYTRIM) ophthalmic solution; Place 1 drop into the right eye every 4 (four) hours while awake for 5 days.  Dispense: 10 mL; Refill: 0     Follow Up Instructions: I discussed the assessment and treatment plan with the patient. The patient was provided an opportunity to ask questions and all were answered. The patient agreed with the plan and demonstrated an understanding of the instructions.  A copy of instructions were sent to the patient via MyChart unless otherwise noted below.     The patient was advised to call back or seek an in-person evaluation if the symptoms worsen or if the condition fails to improve as anticipated.  Time:  I spent 10 minutes with the patient via telehealth technology  discussing the above problems/concerns.    Viviano Simas, FNP

## 2021-03-05 ENCOUNTER — Emergency Department (HOSPITAL_COMMUNITY)
Admission: EM | Admit: 2021-03-05 | Discharge: 2021-03-05 | Disposition: A | Discharge status: Home / Self Care | Payer: No Typology Code available for payment source | Attending: Emergency Medicine | Admitting: Emergency Medicine

## 2021-03-05 ENCOUNTER — Emergency Department (HOSPITAL_COMMUNITY): Payer: No Typology Code available for payment source

## 2021-03-05 ENCOUNTER — Other Ambulatory Visit (HOSPITAL_COMMUNITY): Payer: Self-pay

## 2021-03-05 ENCOUNTER — Encounter (HOSPITAL_COMMUNITY): Payer: Self-pay | Admitting: Emergency Medicine

## 2021-03-05 DIAGNOSIS — S60512A Abrasion of left hand, initial encounter: Secondary | ICD-10-CM | POA: Insufficient documentation

## 2021-03-05 DIAGNOSIS — S0990XA Unspecified injury of head, initial encounter: Secondary | ICD-10-CM | POA: Insufficient documentation

## 2021-03-05 DIAGNOSIS — S199XXA Unspecified injury of neck, initial encounter: Secondary | ICD-10-CM | POA: Insufficient documentation

## 2021-03-05 DIAGNOSIS — Z23 Encounter for immunization: Secondary | ICD-10-CM | POA: Diagnosis not present

## 2021-03-05 DIAGNOSIS — M25511 Pain in right shoulder: Secondary | ICD-10-CM | POA: Insufficient documentation

## 2021-03-05 DIAGNOSIS — Y92410 Unspecified street and highway as the place of occurrence of the external cause: Secondary | ICD-10-CM | POA: Insufficient documentation

## 2021-03-05 DIAGNOSIS — Z79899 Other long term (current) drug therapy: Secondary | ICD-10-CM | POA: Diagnosis not present

## 2021-03-05 DIAGNOSIS — J45909 Unspecified asthma, uncomplicated: Secondary | ICD-10-CM | POA: Insufficient documentation

## 2021-03-05 DIAGNOSIS — S3992XA Unspecified injury of lower back, initial encounter: Secondary | ICD-10-CM | POA: Diagnosis not present

## 2021-03-05 DIAGNOSIS — S6992XA Unspecified injury of left wrist, hand and finger(s), initial encounter: Secondary | ICD-10-CM | POA: Diagnosis present

## 2021-03-05 LAB — I-STAT CHEM 8, ED
BUN: 15 mg/dL (ref 6–20)
Calcium, Ion: 1.21 mmol/L (ref 1.15–1.40)
Chloride: 106 mmol/L (ref 98–111)
Creatinine, Ser: 0.6 mg/dL (ref 0.44–1.00)
Glucose, Bld: 101 mg/dL — ABNORMAL HIGH (ref 70–99)
HCT: 40 % (ref 36.0–46.0)
Hemoglobin: 13.6 g/dL (ref 12.0–15.0)
Potassium: 3.8 mmol/L (ref 3.5–5.1)
Sodium: 140 mmol/L (ref 135–145)
TCO2: 27 mmol/L (ref 22–32)

## 2021-03-05 LAB — ETHANOL: Alcohol, Ethyl (B): <10 mg/dL (ref <10)

## 2021-03-05 LAB — CBC WITH DIFFERENTIAL/PLATELET
Abs Immature Granulocytes: 0.02 10*3/uL (ref 0.00–0.07)
Basophils Absolute: 0 10*3/uL (ref 0.0–0.1)
Basophils Relative: 1 %
Eosinophils Absolute: 0.2 10*3/uL (ref 0.0–0.5)
Eosinophils Relative: 4 %
HCT: 38.6 % (ref 36.0–46.0)
Hemoglobin: 12.8 g/dL (ref 12.0–15.0)
Immature Granulocytes: 0 %
Lymphocytes Relative: 37 %
Lymphs Abs: 1.9 10*3/uL (ref 0.7–4.0)
MCH: 29.9 pg (ref 26.0–34.0)
MCHC: 33.2 g/dL (ref 30.0–36.0)
MCV: 90.2 fL (ref 80.0–100.0)
Monocytes Absolute: 0.4 10*3/uL (ref 0.1–1.0)
Monocytes Relative: 8 %
Neutro Abs: 2.6 10*3/uL (ref 1.7–7.7)
Neutrophils Relative %: 50 %
Platelets: 222 10*3/uL (ref 150–400)
RBC: 4.28 MIL/uL (ref 3.87–5.11)
RDW: 12.7 % (ref 11.5–15.5)
WBC: 5.1 10*3/uL (ref 4.0–10.5)
nRBC: 0 % (ref 0.0–0.2)

## 2021-03-05 LAB — TYPE AND SCREEN
ABO/RH(D): A POS
Antibody Screen: NEGATIVE

## 2021-03-05 LAB — COMPREHENSIVE METABOLIC PANEL
ALT: 17 U/L (ref 0–44)
AST: 15 U/L (ref 15–41)
Albumin: 4.5 g/dL (ref 3.5–5.0)
Alkaline Phosphatase: 40 U/L (ref 38–126)
Anion gap: 6 (ref 5–15)
BUN: 12 mg/dL (ref 6–20)
CO2: 25 mmol/L (ref 22–32)
Calcium: 9 mg/dL (ref 8.9–10.3)
Chloride: 107 mmol/L (ref 98–111)
Creatinine, Ser: 0.77 mg/dL (ref 0.44–1.00)
GFR, Estimated: >60 mL/min (ref >60)
Glucose, Bld: 105 mg/dL — ABNORMAL HIGH (ref 70–99)
Potassium: 3.9 mmol/L (ref 3.5–5.1)
Sodium: 138 mmol/L (ref 135–145)
Total Bilirubin: 1 mg/dL (ref 0.3–1.2)
Total Protein: 6.9 g/dL (ref 6.5–8.1)

## 2021-03-05 LAB — PROTIME-INR
INR: 1 (ref 0.8–1.2)
Prothrombin Time: 13 seconds (ref 11.4–15.2)

## 2021-03-05 LAB — I-STAT BETA HCG BLOOD, ED (MC, WL, AP ONLY): I-stat hCG, quantitative: <5 m[IU]/mL (ref <5)

## 2021-03-05 MED ORDER — TETANUS-DIPHTH-ACELL PERTUSSIS 5-2.5-18.5 LF-MCG/0.5 IM SUSY
0.5000 mL | PREFILLED_SYRINGE | Freq: Once | INTRAMUSCULAR | Status: AC
  Administered 2021-03-05: 0.5 mL via INTRAMUSCULAR
  Filled 2021-03-05: qty 0.5

## 2021-03-05 MED ORDER — MORPHINE SULFATE (PF) 4 MG/ML IV SOLN
4.0000 mg | Freq: Once | INTRAVENOUS | Status: AC
  Administered 2021-03-05: 4 mg via INTRAVENOUS
  Filled 2021-03-05: qty 1

## 2021-03-05 MED ORDER — IOHEXOL 350 MG/ML SOLN
80.0000 mL | Freq: Once | INTRAVENOUS | Status: AC | PRN
  Administered 2021-03-05: 80 mL via INTRAVENOUS

## 2021-03-05 MED ORDER — KETOROLAC TROMETHAMINE 15 MG/ML IJ SOLN
15.0000 mg | Freq: Once | INTRAMUSCULAR | Status: AC
  Administered 2021-03-05: 15 mg via INTRAVENOUS
  Filled 2021-03-05: qty 1

## 2021-03-05 MED ORDER — OXYCODONE-ACETAMINOPHEN 5-325 MG PO TABS
1.0000 | ORAL_TABLET | Freq: Once | ORAL | Status: AC
Start: 2021-03-05 — End: 2021-03-05
  Administered 2021-03-05: 1 via ORAL
  Filled 2021-03-05: qty 1

## 2021-03-05 MED ORDER — OXYCODONE-ACETAMINOPHEN 5-325 MG PO TABS
1.0000 | ORAL_TABLET | Freq: Three times a day (TID) | ORAL | 0 refills | Status: DC | PRN
  Filled 2021-03-05: qty 8, 3d supply, fill #0

## 2021-03-05 MED ORDER — ONDANSETRON HCL 4 MG PO TABS
4.0000 mg | ORAL_TABLET | Freq: Four times a day (QID) | ORAL | 0 refills | Status: DC
  Filled 2021-03-05: qty 12, 3d supply, fill #0

## 2021-03-05 NOTE — Progress Notes (Signed)
Orthopedic Tech Progress Note Patient Details:  Susan Boyle [birth year] [id number]  Patient ID: [name], female   DOB: 09-May-1990, 31 y.o.   MRN: [id number] Level II; not needed.  Darleen Crocker 03/05/2021, 8:20 AM

## 2021-03-05 NOTE — ED Notes (Signed)
Cleansed pt's left knuckle wounds and placed bacitracin/band aids.

## 2021-03-05 NOTE — Discharge Instructions (Signed)
Return for any problem.  Use ibuprofen as instructed for pain.  If you require additional pain medicine you may take a Percocet.  Drink plenty of fluids.  Apply ice and/or heat to affected areas as instructed.  Keep abrasions clean and dry.
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
## 2021-03-05 NOTE — ED Notes (Signed)
Pt transported to CT ?

## 2021-03-08 ENCOUNTER — Telehealth: Payer: No Typology Code available for payment source | Admitting: Family

## 2021-03-08 DIAGNOSIS — M545 Low back pain, unspecified: Secondary | ICD-10-CM

## 2021-03-08 DIAGNOSIS — M542 Cervicalgia: Secondary | ICD-10-CM

## 2021-03-08 DIAGNOSIS — M25511 Pain in right shoulder: Secondary | ICD-10-CM

## 2021-03-08 DIAGNOSIS — Z09 Encounter for follow-up examination after completed treatment for conditions other than malignant neoplasm: Secondary | ICD-10-CM | POA: Diagnosis not present

## 2021-03-08 MED ORDER — BACLOFEN 10 MG PO TABS: 10.0000 mg | ORAL_TABLET | Freq: Three times a day (TID) | ORAL | 0 refills | Status: DC

## 2021-03-08 MED ORDER — IBUPROFEN 600 MG PO TABS: 600.0000 mg | ORAL_TABLET | Freq: Three times a day (TID) | ORAL | 0 refills | Status: DC | PRN
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
## 2021-03-08 NOTE — Patient Instructions (Signed)
Muscle Pain, Adult ?Muscle pain, also called myalgia, is a condition in which a person has pain in one or more muscles in the body. Muscle pain may be mild, moderate, or severe. It may feel sharp, achy, or burning. In most cases, the pain lasts only a short time and goes away without treatment. ?Muscle pain can result from using muscles in a new or different way or after a period of inactivity. It is normal to feel some muscle pain after starting an exercise program. Muscles that have not been used often will be sore at first. ?What are the causes? ?This condition is caused by using muscles in a new or different way after a period of inactivity. Other causes may include: ?Overuse or muscle strain, especially if you are not in shape. This is the most common cause of muscle pain. ?Injury or bruising. ?Infectious diseases, including diseases caused by viruses, such as the flu (influenza). ?Fibromyalgia.This is a long-term, or chronic, condition that causes muscle tenderness, tiredness (fatigue), and headache. ?Autoimmune or rheumatologic diseases. These are conditions, such as lupus, in which the body's defense system (immunesystem) attacks areas in the body. ?Certain medicines, including ACE inhibitors and statins. ?What are the signs or symptoms? ?The main symptom of this condition is sore or painful muscles, including during activity and when stretching. You may also have slight swelling. ?How is this diagnosed? ?This condition is diagnosed with a physical exam. Your health care provider will ask questions about your pain and when it began. If you have not had muscle pain for very long, your health care provider may want to wait before doing much testing. If your muscle pain has lasted a long time, tests may be done right away. In some cases, this may include tests to rule out certain conditions or illnesses. ?How is this treated? ?Treatment for this condition depends on the cause. Home care is often enough to  relieve muscle pain. Your health care provider may also prescribe NSAIDs, such as ibuprofen. ?Follow these instructions at home: ?Medicines ?Take over-the-counter and prescription medicines only as told by your health care provider. ?Ask your health care provider if the medicine prescribed to you requires you to avoid driving or using machinery. ?Managing pain, swelling, and discomfort ?  ?If directed, put ice on the painful area. To do this: ?Put ice in a plastic bag. ?Place a towel between your skin and the bag. ?Leave the ice on for 20 minutes, 2-3 times a day. ?For the first 2 days of muscle soreness, or if there is swelling: ?Do not soak in hot baths. ?Do not use a hot tub, steam room, sauna, heating pad, or other heat source. ?After 48-72 hours, you may alternate between applying ice and applying heat as told by your health care provider. If directed, apply heat to the affected area as often as told by your health care provider. Use the heat source that your health care provider recommends, such as a moist heat pack or a heating pad. ?Place a towel between your skin and the heat source. ?Leave the heat on for 20-30 minutes. ?Remove the heat if your skin turns bright red. This is especially important if you are unable to feel pain, heat, or cold. You may have a greater risk of getting burned. ?If you have an injury, raise (elevate) the injured area above the level of your heart while you are sitting or lying down. ?Activity ? ?If overuse is causing your muscle pain: ?Slow down your activities   until the pain goes away. ?Do regular, gentle exercises if you are not usually active. ?Warm up before exercising. Stretch before and after exercising. This can help lower the risk of muscle pain. ?Do not continue working out if the pain is severe. Severe pain could mean that you have injured a muscle. ?Do not lift anything that is heavier than 5-10 lb (2.3-4.5 kg), or the limit that you are told, until your health care  provider says that it is safe. ?Return to your normal activities as told by your health care provider. Ask your health care provider what activities are safe for you. ?General instructions ?Do not use any products that contain nicotine or tobacco, such as cigarettes, e-cigarettes, and chewing tobacco. These can delay healing. If you need help quitting, ask your health care provider. ?Keep all follow-up visits as told by your health care provider. This is important. ?Contact a health care provider if you have: ?Muscle pain that gets worse and medicines do not help. ?Muscle pain that lasts longer than 3 days. ?A rash or fever along with muscle pain. ?Muscle pain after a tick bite. ?Muscle pain while working out, even though you are in good physical condition. ?Redness, soreness, or swelling along with muscle pain. ?Muscle pain after starting a new medicine or changing the dose of a medicine. ?Get help right away if you have: ?Trouble breathing. ?Trouble swallowing. ?Muscle pain along with a stiff neck, fever, and vomiting. ?Severe muscle weakness or you cannot move part of your body. ?These symptoms may represent a serious problem that is an emergency. Do not wait to see if the symptoms will go away. Get medical help right away. Call your local emergency services (911 in the [place] Do not drive yourself to the hospital. ?Summary ?Muscle pain usually lasts only a short time and goes away without treatment. ?This condition is caused by using muscles in a new or different way after a period of inactivity. ?If your muscle pain lasts longer than 3 days, tell your health care provider. ?This information is not intended to replace advice given to you by your health care provider. Make sure you discuss any questions you have with your health care provider. ?Document Revised: 08/31/2020 Document Reviewed: 08/31/2020 ?Elsevier Patient Education ? 2022 Elsevier Inc[employer] ? ?

## 2021-03-14 ENCOUNTER — Telehealth: Payer: No Typology Code available for payment source | Admitting: Physician Assistant

## 2021-03-14 ENCOUNTER — Other Ambulatory Visit (HOSPITAL_COMMUNITY): Payer: Self-pay

## 2021-03-14 DIAGNOSIS — M25511 Pain in right shoulder: Secondary | ICD-10-CM

## 2021-03-14 MED ORDER — ETODOLAC 200 MG PO CAPS
200.0000 mg | ORAL_CAPSULE | Freq: Three times a day (TID) | ORAL | 0 refills | Status: DC
  Filled 2021-03-14: qty 20, 7d supply, fill #0

## 2021-03-14 MED ORDER — LIDOCAINE 5 % EX PTCH
1.0000 | MEDICATED_PATCH | CUTANEOUS | 0 refills | Status: DC
  Filled 2021-03-14: qty 30, 30d supply, fill #0

## 2021-03-14 MED ORDER — FLUTICASONE PROPIONATE 50 MCG/ACT NA SUSP
2.0000 | Freq: Every day | NASAL | 0 refills | Status: DC
  Filled 2021-03-14: qty 16, 30d supply, fill #0
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
## 2021-03-14 NOTE — Patient Instructions (Signed)
Susan Boyle, thank you for joining [name], PA-C for today's virtual visit.  While this provider is not your primary care provider (PCP), if your PCP is located in our provider database this encounter information will be shared with them immediately following your visit.  Consent: (Patient) Susan Boyle provided verbal consent for this virtual visit at the beginning of the encounter.  Current Medications:  Current Outpatient Medications:    etodolac (LODINE) 200 MG capsule, Take 1 capsule (200 mg total) by mouth every 8 (eight) hours., Disp: 20 capsule, Rfl: 0   fluticasone (FLONASE) 50 MCG/ACT nasal spray, Place 2 sprays into both nostrils daily., Disp: 16 g, Rfl: 0   lidocaine (LIDODERM) 5 %, Place 1 patch onto the skin daily. Remove & Discard patch within 12 hours or as directed by MD, Disp: 30 patch, Rfl: 0   albuterol (VENTOLIN HFA) 108 (90 Base) MCG/ACT inhaler, Inhale 2 puffs into the lungs every 4 (four) hours as needed for wheezing or shortness of breath (coughing fits)., Disp: 18 g, Rfl: 2   amphetamine-dextroamphetamine (ADDERALL) 20 MG tablet, Take 1 tablet (20 mg total) by mouth 4 (four) times daily., Disp: 120 tablet, Rfl: 0   baclofen (LIORESAL) 10 MG tablet, Take 1 tablet (10 mg total) by mouth 3 (three) times daily., Disp: 60 each, Rfl: 0   Carbinoxamine Maleate 4 MG TABS, Take 1-1.5 tablets (4-6 mg total) by mouth 2 (two) times daily as needed for allergies., Disp: 60 tablet, Rfl: 5   EPINEPHrine 0.3 mg/0.3 mL IJ SOAJ injection, Inject 0.3 mg into the muscle as needed for anaphylaxis., Disp: 2 each, Rfl: 2   fluticasone-salmeterol (ADVAIR DISKUS) 250-50 MCG/ACT AEPB, Inhale 1 puff into the lungs in the morning and at bedtime. Rinse mouth after each use., Disp: 60 each, Rfl: 5   levonorgestrel (MIRENA) 20 MCG/24HR IUD, 1 each by Intrauterine route once., Disp: , Rfl:    ondansetron (ZOFRAN) 4 MG tablet, Take 1 tablet (4 mg total) by mouth every 6 (six) hours.,  Disp: 12 tablet, Rfl: 0   oxyCODONE-acetaminophen (PERCOCET/ROXICET) 5-325 MG tablet, Take 1 tablet by mouth every 8 (eight) hours as needed for severe pain., Disp: 8 tablet, Rfl: 0   PARoxetine (PAXIL) 20 MG tablet, Take 1 tablet by mouth at bedtime, Disp: 30 tablet, Rfl: 11   traZODone (DESYREL) 50 MG tablet, Take 1 to 3 tablets by mouth at bedtime (Patient taking differently: Take 50-150 mg by mouth at bedtime.), Disp: 90 tablet, Rfl: 11   Medications ordered in this encounter:  Meds ordered this encounter  Medications   etodolac (LODINE) 200 MG capsule    Sig: Take 1 capsule (200 mg total) by mouth every 8 (eight) hours.    Dispense:  20 capsule    Refill:  0    Order Specific Question:   Supervising Provider    Answer:   MILLER, BRIAN [[id number]]   fluticasone (FLONASE) 50 MCG/ACT nasal spray    Sig: Place 2 sprays into both nostrils daily.    Dispense:  16 g    Refill:  0    Order Specific Question:   Supervising Provider    Answer:   MILLER, BRIAN [[id number]]   lidocaine (LIDODERM) 5 %    Sig: Place 1 patch onto the skin daily. Remove & Discard patch within 12 hours or as directed by MD    Dispense:  30 patch    Refill:  0    Order Specific Question:  Supervising Provider    Answer:   Noemi Chapel [phone]     *If you need refills on other medications prior to your next appointment, please contact your pharmacy*  Follow-Up: Call back or seek an in-person evaluation if the symptoms worsen or if the condition fails to improve as anticipated.  Other Instructions Please continue to avoid heavy lifting and overexertion. Stop the Ibuprofen.  Continue the Baclofen, starting the Etodolac as directed. You can use Tylenol for breakthrough pain. I do recommend repeat in-person evaluation as soon as you are able, either at one of our urgent cares (see link below) or at one of our local [facility] Urgent Cares (no referral needed for UC visit).     If you have been instructed to have an  in-person evaluation today at a local Urgent Care facility, please use the link below. It will take you to a list of all of our available [facility], including address, phone number and hours of operation. Please do not delay care.  [facility]  If you or a family member do not have a primary care provider, use the link below to schedule a visit and establish care. When you choose a [facility] primary care physician or advanced practice provider, you gain a long-term partner in health. Find a Primary Care Provider  Learn more about Amanda's in-office and virtual care options: [facility]

## 2021-03-18 ENCOUNTER — Other Ambulatory Visit (HOSPITAL_COMMUNITY): Payer: Self-pay

## 2021-03-18 MED ORDER — AMPHETAMINE-DEXTROAMPHETAMINE 20 MG PO TABS
20.0000 mg | ORAL_TABLET | Freq: Three times a day (TID) | ORAL | 0 refills | Status: DC
  Filled 2021-03-18: qty 90, 30d supply, fill #0

## 2021-03-20 ENCOUNTER — Ambulatory Visit (INDEPENDENT_AMBULATORY_CARE_PROVIDER_SITE_OTHER): Payer: Self-pay

## 2021-03-20 DIAGNOSIS — J309 Allergic rhinitis, unspecified: Secondary | ICD-10-CM

## 2021-03-22 ENCOUNTER — Other Ambulatory Visit: Payer: Self-pay

## 2021-03-22 ENCOUNTER — Ambulatory Visit (INDEPENDENT_AMBULATORY_CARE_PROVIDER_SITE_OTHER): Payer: No Typology Code available for payment source

## 2021-03-22 ENCOUNTER — Ambulatory Visit (INDEPENDENT_AMBULATORY_CARE_PROVIDER_SITE_OTHER): Payer: No Typology Code available for payment source | Admitting: Sports Medicine

## 2021-03-22 ENCOUNTER — Ambulatory Visit: Payer: No Typology Code available for payment source | Admitting: Family Medicine

## 2021-03-22 DIAGNOSIS — M25511 Pain in right shoulder: Secondary | ICD-10-CM

## 2021-03-22 MED ORDER — PREDNISONE 50 MG PO TABS: ORAL_TABLET | ORAL | 0 refills | Status: DC

## 2021-03-22 MED ORDER — CYCLOBENZAPRINE HCL 10 MG PO TABS: ORAL_TABLET | ORAL | 0 refills | Status: DC
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
## 2021-03-25 ENCOUNTER — Telehealth: Payer: Self-pay | Admitting: Sports Medicine

## 2021-03-25 NOTE — Telephone Encounter (Signed)
I spoke with patient on 03/25/2021 advising her that she will need an appointment to complete her FMLA forms and she stated that she just seen Dr. Benjamin Stain on 03/22/2021 and he is aware of the FMLA forms being sent to him for completion.

## 2021-04-04 ENCOUNTER — Ambulatory Visit (INDEPENDENT_AMBULATORY_CARE_PROVIDER_SITE_OTHER): Payer: Self-pay

## 2021-04-04 DIAGNOSIS — J309 Allergic rhinitis, unspecified: Secondary | ICD-10-CM

## 2021-04-16 ENCOUNTER — Other Ambulatory Visit (HOSPITAL_COMMUNITY): Payer: Self-pay

## 2021-04-16 MED ORDER — AMPHETAMINE-DEXTROAMPHETAMINE 20 MG PO TABS: 20.0000 mg | ORAL_TABLET | Freq: Three times a day (TID) | ORAL | 0 refills | Status: DC

## 2021-04-19 ENCOUNTER — Ambulatory Visit (INDEPENDENT_AMBULATORY_CARE_PROVIDER_SITE_OTHER): Payer: No Typology Code available for payment source | Admitting: Sports Medicine

## 2021-04-19 ENCOUNTER — Ambulatory Visit: Payer: No Typology Code available for payment source | Admitting: Medical-Surgical

## 2021-04-19 ENCOUNTER — Other Ambulatory Visit: Payer: Self-pay

## 2021-04-19 DIAGNOSIS — M25511 Pain in right shoulder: Secondary | ICD-10-CM
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
## 2021-04-23 ENCOUNTER — Other Ambulatory Visit (HOSPITAL_COMMUNITY): Payer: Self-pay

## 2021-04-24 ENCOUNTER — Other Ambulatory Visit (HOSPITAL_COMMUNITY): Payer: Self-pay

## 2021-04-24 MED ORDER — DYANAVEL XR 20 MG PO CHER
1.0000 | CHEWABLE_EXTENDED_RELEASE_TABLET | Freq: Every morning | ORAL | 0 refills | Status: DC
  Filled 2021-04-24 – 2021-04-29 (×3): qty 30, 30d supply, fill #0

## 2021-04-25 ENCOUNTER — Other Ambulatory Visit (HOSPITAL_COMMUNITY): Payer: Self-pay

## 2021-04-29 ENCOUNTER — Other Ambulatory Visit (HOSPITAL_COMMUNITY): Payer: Self-pay

## 2021-05-01 ENCOUNTER — Other Ambulatory Visit: Payer: Self-pay

## 2021-05-01 ENCOUNTER — Ambulatory Visit (INDEPENDENT_AMBULATORY_CARE_PROVIDER_SITE_OTHER): Payer: No Typology Code available for payment source

## 2021-05-01 ENCOUNTER — Ambulatory Visit (INDEPENDENT_AMBULATORY_CARE_PROVIDER_SITE_OTHER): Payer: No Typology Code available for payment source | Admitting: Sports Medicine

## 2021-05-01 DIAGNOSIS — M25511 Pain in right shoulder: Secondary | ICD-10-CM
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
## 2021-05-02 ENCOUNTER — Ambulatory Visit: Payer: No Typology Code available for payment source | Admitting: Medical-Surgical

## 2021-05-04 ENCOUNTER — Telehealth: Payer: No Typology Code available for payment source | Admitting: Physician Assistant

## 2021-05-04 DIAGNOSIS — R051 Acute cough: Secondary | ICD-10-CM | POA: Diagnosis not present

## 2021-05-04 DIAGNOSIS — J069 Acute upper respiratory infection, unspecified: Secondary | ICD-10-CM | POA: Diagnosis not present

## 2021-05-04 MED ORDER — PSEUDOEPH-BROMPHEN-DM 30-2-10 MG/5ML PO SYRP: 10.0000 mL | ORAL_SOLUTION | Freq: Four times a day (QID) | ORAL | 0 refills | Status: AC | PRN

## 2021-05-04 NOTE — Progress Notes (Signed)
?Virtual Visit Consent  ? ?Susan Boyle, you are scheduled for a virtual visit with a [facility] provider today.   ?  ?Just as with appointments in the office, your consent must be obtained to participate.  Your consent will be active for this visit and any virtual visit you may have with one of our providers in the next 365 days.   ?  ?If you have a MyChart account, a copy of this consent can be sent to you electronically.  All virtual visits are billed to your insurance company just like a traditional visit in the office.   ? ?As this is a virtual visit, video technology does not allow for your provider to perform a traditional examination.  This may limit your provider's ability to fully assess your condition.  If your provider identifies any concerns that need to be evaluated in person or the need to arrange testing (such as labs, EKG, etc.), we will make arrangements to do so.   ?  ?Although advances in technology are sophisticated, we cannot ensure that it will always work on either your end or our end.  If the connection with a video visit is poor, the visit may have to be switched to a telephone visit.  With either a video or telephone visit, we are not always able to ensure that we have a secure connection.    ? ?I need to obtain your verbal consent now.   Are you willing to proceed with your visit today?  ?  ?Susan Boyle has provided verbal consent on 05/04/2021 for a virtual visit (video or telephone). ?  ?Susan Rad, PA-C  ? ?Date: 05/04/2021 4:37 PM ? ? ?Virtual Visit via Video Note  ? ?Susan Boyle, connected with  Susan Boyle  ([id number], Jan 21, 1991) on 05/04/21 at  4:30 PM EST by a video-enabled telemedicine application and verified that I am speaking with the correct person using two identifiers. ? ?Location: ?Patient: Virtual Visit Location Patient: Home ?Provider: Virtual Visit Location Provider: Home Office ?  ?I discussed the limitations of evaluation and management by  telemedicine and the availability of in person appointments. The patient expressed understanding and agreed to proceed.   ? ?History of Present Illness: ?Susan Boyle is a 31 y.o. who identifies as a female who was assigned female at birth, states that she has been feeling poorly for the past week, states that she has been having "severe cold symptoms".  States that she has been having nasal congestion, nasal drainage, unmeasured fever, chills, and then started having a productive cough 3 days ago with white sputum.  Reports that she did take a home COVID test a few days after her symptoms started, reports negative result.  States coworkers have similar symptoms. ? ?States the cough is keeping her awake at night.  Is eating and drinking okay.  States that she has been using DayQuil, Advil, Benadryl and hot tea with modest relief. ? ?HPI: HPI  ?Problems:  ?Patient Active Problem List  ? Diagnosis Date Noted  ? Acute pain of right shoulder 03/22/2021  ? Seasonal and perennial allergic rhinoconjunctivitis 11/05/2020  ? Allergic reaction 07/04/2020  ? Food allergy 05/02/2020  ? Environmental allergies 05/02/2020  ? Dermatographism 05/02/2020  ? Asthma   ? Lateral epicondylitis of right elbow 09/26/2015  ? Subacromial bursitis 08/15/2015  ?  ?Allergies: No Known Allergies ?Medications:  ?Current Outpatient Medications:  ?  brompheniramine-pseudoephedrine-DM 30-2-10 MG/5ML syrup, Take 10 mLs  by mouth 4 (four) times daily as needed for up to 5 days., Disp: 118 mL, Rfl: 0 ?  albuterol (VENTOLIN HFA) 108 (90 Base) MCG/ACT inhaler, Inhale 2 puffs into the lungs every 4 (four) hours as needed for wheezing or shortness of breath (coughing fits)., Disp: 18 g, Rfl: 2 ?  Amphetamine ER (DYANAVEL XR) 20 MG CHER, Take 1 tablet by mouth every morning., Disp: 30 tablet, Rfl: 0 ?  Carbinoxamine Maleate 4 MG TABS, Take 1-1.5 tablets (4-6 mg total) by mouth 2 (two) times daily as needed for allergies., Disp: 60 tablet, Rfl: 5 ?   cyclobenzaprine (FLEXERIL) 10 MG tablet, One half to one tab PO qHS, then increase gradually to one tab TID., Disp: 30 tablet, Rfl: 0 ?  EPINEPHrine 0.3 mg/0.3 mL IJ SOAJ injection, Inject 0.3 mg into the muscle as needed for anaphylaxis., Disp: 2 each, Rfl: 2 ?  fluticasone (FLONASE) 50 MCG/ACT nasal spray, Place 2 sprays into both nostrils daily., Disp: 16 g, Rfl: 0 ?  fluticasone-salmeterol (ADVAIR DISKUS) 250-50 MCG/ACT AEPB, Inhale 1 puff into the lungs in the morning and at bedtime. Rinse mouth after each use., Disp: 60 each, Rfl: 5 ?  levonorgestrel (MIRENA) 20 MCG/24HR IUD, 1 each by Intrauterine route once., Disp: , Rfl:  ?  lidocaine (LIDODERM) 5 %, Place 1 patch onto the skin daily. Remove & Discard patch within 12 hours or as directed by MD, Disp: 30 patch, Rfl: 0 ?  ondansetron (ZOFRAN) 4 MG tablet, Take 1 tablet (4 mg total) by mouth every 6 (six) hours., Disp: 12 tablet, Rfl: 0 ?  PARoxetine (PAXIL) 20 MG tablet, Take 1 tablet by mouth at bedtime, Disp: 30 tablet, Rfl: 11 ?  traZODone (DESYREL) 50 MG tablet, Take 1 to 3 tablets by mouth at bedtime (Patient taking differently: Take 50-150 mg by mouth at bedtime.), Disp: 90 tablet, Rfl: 11 ? ?Observations/Objective: ?Patient is well-developed, well-nourished in no acute distress.  ?Resting comfortably  at home.  ?Head is normocephalic, atraumatic.  ?No labored breathing.  ?Speech is clear and coherent with logical content.  ?Patient is alert and oriented at baseline.  ? ? ?Assessment and Plan: ?1. Acute cough ?- brompheniramine-pseudoephedrine-DM 30-2-10 MG/5ML syrup; Take 10 mLs by mouth 4 (four) times daily as needed for up to 5 days.  Dispense: 118 mL; Refill: 0 ? ?2. Upper respiratory tract infection, unspecified type ? ?Patient education given on supportive care, red flags given for prompt reevaluation. ? ?Follow Up Instructions: ?I discussed the assessment and treatment plan with the patient. The patient was provided an opportunity to ask  questions and all were answered. The patient agreed with the plan and demonstrated an understanding of the instructions.  A copy of instructions were sent to the patient via MyChart unless otherwise noted below.  ? ? ? ?The patient was advised to call back or seek an in-person evaluation if the symptoms worsen or if the condition fails to improve as anticipated. ? ?Time:  ?I spent 12 minutes with the patient via telehealth technology discussing the above problems/concerns.   ? ?Valeriano Bain S Mayers, PA-C ? ?

## 2021-05-04 NOTE — Patient Instructions (Signed)
?[facility] People, thank you for joining Kennieth Rad, PA-C for today's virtual visit.  While this provider is not your primary care provider (PCP), if your PCP is located in our provider database this encounter information will be shared with them immediately following your visit. ? ?Consent: ?(Patient) Susan Boyle provided verbal consent for this virtual visit at the beginning of the encounter. ? ?Current Medications: ? ?Current Outpatient Medications:  ?  brompheniramine-pseudoephedrine-DM 30-2-10 MG/5ML syrup, Take 10 mLs by mouth 4 (four) times daily as needed for up to 5 days., Disp: 118 mL, Rfl: 0 ?  albuterol (VENTOLIN HFA) 108 (90 Base) MCG/ACT inhaler, Inhale 2 puffs into the lungs every 4 (four) hours as needed for wheezing or shortness of breath (coughing fits)., Disp: 18 g, Rfl: 2 ?  Amphetamine ER (DYANAVEL XR) 20 MG CHER, Take 1 tablet by mouth every morning., Disp: 30 tablet, Rfl: 0 ?  Carbinoxamine Maleate 4 MG TABS, Take 1-1.5 tablets (4-6 mg total) by mouth 2 (two) times daily as needed for allergies., Disp: 60 tablet, Rfl: 5 ?  cyclobenzaprine (FLEXERIL) 10 MG tablet, One half to one tab PO qHS, then increase gradually to one tab TID., Disp: 30 tablet, Rfl: 0 ?  EPINEPHrine 0.3 mg/0.3 mL IJ SOAJ injection, Inject 0.3 mg into the muscle as needed for anaphylaxis., Disp: 2 each, Rfl: 2 ?  fluticasone (FLONASE) 50 MCG/ACT nasal spray, Place 2 sprays into both nostrils daily., Disp: 16 g, Rfl: 0 ?  fluticasone-salmeterol (ADVAIR DISKUS) 250-50 MCG/ACT AEPB, Inhale 1 puff into the lungs in the morning and at bedtime. Rinse mouth after each use., Disp: 60 each, Rfl: 5 ?  levonorgestrel (MIRENA) 20 MCG/24HR IUD, 1 each by Intrauterine route once., Disp: , Rfl:  ?  lidocaine (LIDODERM) 5 %, Place 1 patch onto the skin daily. Remove & Discard patch within 12 hours or as directed by MD, Disp: 30 patch, Rfl: 0 ?  ondansetron (ZOFRAN) 4 MG tablet, Take 1 tablet (4 mg total) by mouth every 6 (six)  hours., Disp: 12 tablet, Rfl: 0 ?  PARoxetine (PAXIL) 20 MG tablet, Take 1 tablet by mouth at bedtime, Disp: 30 tablet, Rfl: 11 ?  traZODone (DESYREL) 50 MG tablet, Take 1 to 3 tablets by mouth at bedtime (Patient taking differently: Take 50-150 mg by mouth at bedtime.), Disp: 90 tablet, Rfl: 11  ? ?Medications ordered in this encounter:  ?Meds ordered this encounter  ?Medications  ? brompheniramine-pseudoephedrine-DM 30-2-10 MG/5ML syrup  ?  Sig: Take 10 mLs by mouth 4 (four) times daily as needed for up to 5 days.  ?  Dispense:  118 mL  ?  Refill:  0  ?  Order Specific Question:   Supervising Provider  ?  Answer:   Noemi Chapel [[id number]]  ?  ? ?*If you need refills on other medications prior to your next appointment, please contact your pharmacy* ? ?Follow-Up: ?Call back or seek an in-person evaluation if the symptoms worsen or if the condition fails to improve as anticipated. ? ?Other Instructions ?I encourage you to get plenty of rest, and stay very well-hydrated.  I hope that you feel better soon, please let us know if there is anything else we can do for you ? ? ?If you have been instructed to have an in-person evaluation today at a local Urgent Care facility, please use the link below. It will take you to a list of all of our available [facility], including address,  phone number and hours of operation. Please do not delay care.  ?[facility] ? ?If you or a family member do not have a primary care provider, use the link below to schedule a visit and establish care. When you choose a [facility] primary care physician or advanced practice provider, you gain a long-term partner in health. ?Find a Primary Care Provider ? ?Learn more about [facility] in-office and virtual care options: ?[facility] ? ?Cough, Adult ?Coughing is a reflex that clears your throat and your airways (respiratory system). Coughing helps to heal and protect your lungs. It is normal to cough  occasionally, but a cough that happens with other symptoms or lasts a long time may be a sign of a condition that needs treatment. An acute cough may only last 2-3 weeks, while a chronic cough may last 8 or more weeks. ?Coughing is commonly caused by: ?Infection of the respiratory systemby viruses or bacteria. ?Breathing in substances that irritate your lungs. ?Allergies. ?Asthma. ?Mucus that runs down the back of your throat (postnasal drip). ?Smoking. ?Acid backing up from the stomach into the esophagus (gastroesophageal reflux). ?Certain medicines. ?Chronic lung problems. ?Other medical conditions such as heart failure or a blood clot in the lung (pulmonary embolism). ?Follow these instructions at home: ?Medicines ?Take over-the-counter and prescription medicines only as told by your health care provider. ?Talk with your health care provider before you take a cough suppressant medicine. ?Lifestyle ? ?Avoid cigarette smoke. Do not use any products that contain nicotine or tobacco, such as cigarettes, e-cigarettes, and chewing tobacco. If you need help quitting, ask your health care provider. ?Drink enough fluid to keep your urine pale yellow. ?Avoid caffeine. ?Do not drink alcohol if your health care provider tells you not to drink. ?General instructions ? ?Pay close attention to changes in your cough. Tell your health care provider about them. ?Always cover your mouth when you cough. ?Avoid things that make you cough, such as perfume, candles, cleaning products, or campfire or tobacco smoke. ?If the air is dry, use a cool mist vaporizer or humidifier in your bedroom or your home to help loosen secretions. ?If your cough is worse at night, try to sleep in a semi-upright position. ?Rest as needed. ?Keep all follow-up visits as told by your health care provider. This is important. ?Contact a health care provider if you: ?Have new symptoms. ?Cough up pus. ?Have a cough that does not get better after 2-3 weeks or gets  worse. ?Cannot control your cough with cough suppressant medicines and you are losing sleep. ?Have pain that gets worse or pain that is not helped with medicine. ?Have a fever. ?Have unexplained weight loss. ?Have night sweats. ?Get help right away if: ?You cough up blood. ?You have difficulty breathing. ?Your heartbeat is very fast. ?These symptoms may represent a serious problem that is an emergency. Do not wait to see if the symptoms will go away. Get medical help right away. Call your local emergency services (911 in the [place] Do not drive yourself to the hospital. ?Summary ?Coughing is a reflex that clears your throat and your airways. It is normal to cough occasionally, but a cough that happens with other symptoms or lasts a long time may be a sign of a condition that needs treatment. ?Take over-the-counter and prescription medicines only as told by your health care provider. ?Always cover your mouth when you cough. ?Contact a health care provider if you have new  symptoms or a cough that does not get better after 2-3 weeks or gets worse. ?This information is not intended to replace advice given to you by your health care provider. Make sure you discuss any questions you have with your health care provider. ?Document Revised: 03/01/2018 Document Reviewed: 03/01/2018 ?Elsevier Patient Education ? [place]. ? ?

## 2021-05-07 NOTE — Progress Notes (Deleted)
? ?Follow Up Note ? ?RE: Susan Boyle MRN: [id number] DOB: 11/20/1990 ?Date of Office Visit: 05/08/2021 ? ?Referring provider: Avanell ShackletonHenson, Vickie L, PA-C ?Primary care provider: No primary care provider on file. ? ?Chief Complaint: No chief complaint on file. ? ?History of Present Illness: ?I had the pleasure of seeing Susan Boyle for a follow up visit at the [facility] on 05/07/2021. She is a 31 y.o. female, who is being followed for asthma, allergic rhinoconjunctivitis on AIT, allergic reactions. Her previous allergy office visit was on 11/05/2020 with Dr. Selena BattenKim. Today is a regular follow up visit. ? ?12/04/2020 ?  ?Carlyon ShadowAshley C Sterne started injections for G-RW-[id number] and M-C-D ? ?Asthma ?Past history - Diagnosed with asthma over 10+ years ago.  Currently using albuterol 1 times a week and frequently forgets to use Advair.  Main triggers include exercise and cold weather. 2022 spirometry showed some restriction with 4% improvement in FEV1 post bronchodilator treatment.  Clinically feeling unchanged. ?Interim history - doing much better with daily Advair.  ?Today's spirometry was normal. ?Daily controller medication(s): Advair 100mcg 1 puff once a day at night and rinse mouth after each use.  ?May use albuterol rescue inhaler 2 puffs every 4 to 6 hours as needed for shortness of breath, chest tightness, coughing, and wheezing. May use albuterol rescue inhaler 2 puffs 5 to 15 minutes prior to strenuous physical activities. Monitor frequency of use.  ?Get spirometry at next visit. ?  ?Seasonal and perennial allergic rhinoconjunctivitis ?Past history - Perennial rhinoconjunctivitis symptoms for many years.  Tried Zyrtec, Claritin Allegra with no benefit.  Nasacort and Flonase causes headaches.  Skin testing as a child showed positive to multiple items per patient report and was on allergy immunotherapy for 2 years. 2022 skin testing showed: Positive to grass, weed pollen, ragweed pollen, trees, mold, and  borderline to cat and dog.  ?Interim history - carbinoxamine helps.  ?Continue environmental control measures as below. ?May use carbinoxamine 4mg  (1 to 1.5 tablet) twice a day as needed for allergies. ?Consider allergy injections for long term control if above medications do not help the symptoms - handout given.  ?Let us know when ready to start.  ?May use OTC eye drops as needed for itchy/watery eyes. ?  ?Allergic reaction ?Past history - 1 episode of allergic reaction in 2018 after eating a chunky clam and corn chowder canned soup at night.  The following morning developed left/facial swelling which eventually progressed to pruritus and hand/feet swelling.  She was treated in ER with epinephrine, Zantac, Benadryl and steroids with good benefit.  Patient has eaten all the ingredients that are in the canned soup since then without any issues and is currently not avoiding any foods.  No more additional allergic reaction since that event. ?Interim history - no reactions since the last visit. ?Avoid the clam & corn chowder with bacon canned soup. ?If you have another reaction then let us know.  ?For mild symptoms you can take over the counter antihistamines such as Benadryl and monitor symptoms closely. If symptoms worsen or if you have severe symptoms including breathing issues, throat closure, significant swelling, whole body hives, severe diarrhea and vomiting, lightheadedness then inject epinephrine and seek immediate medical care afterwards. ?Action plan in place.  ?  ?Return in about 6 months (around 05/05/2021). ? ?Assessment and Plan: ?Susan Boyle is a 31 y.o. female with: ?No problem-specific Assessment & Plan notes found for this encounter. ? ?No follow-ups on file. ? ?No  orders of the defined types were placed in this encounter. ? ?Lab Orders  ?No laboratory test(s) ordered today  ? ? ?Diagnostics: ?Spirometry:  ?Tracings reviewed. Her effort: {Blank single:19197::"Good reproducible efforts.","It was hard to get  consistent efforts and there is a question as to whether this reflects a maximal maneuver.","Poor effort, data can not be interpreted."} ?FVC: ***L ?FEV1: ***L, ***% predicted ?FEV1/FVC ratio: ***% ?Interpretation: {Blank single:19197::"Spirometry consistent with mild obstructive disease","Spirometry consistent with moderate obstructive disease","Spirometry consistent with severe obstructive disease","Spirometry consistent with possible restrictive disease","Spirometry consistent with mixed obstructive and restrictive disease","Spirometry uninterpretable due to technique","Spirometry consistent with normal pattern","No overt abnormalities noted given today's efforts"}.  ?Please see scanned spirometry results for details. ? ?Skin Testing: {Blank single:19197::"Select foods","Environmental allergy panel","Environmental allergy panel and select foods","Food allergy panel","None","Deferred due to recent antihistamines use"}. ?*** ?Results discussed with patient/family. ? ? ?Medication List:  ?Current Outpatient Medications  ?Medication Sig Dispense Refill  ? albuterol (VENTOLIN HFA) 108 (90 Base) MCG/ACT inhaler Inhale 2 puffs into the lungs every 4 (four) hours as needed for wheezing or shortness of breath (coughing fits). 18 g 2  ? Amphetamine ER (DYANAVEL XR) 20 MG CHER Take 1 tablet by mouth every morning. 30 tablet 0  ? brompheniramine-pseudoephedrine-DM 30-2-10 MG/5ML syrup Take 10 mLs by mouth 4 (four) times daily as needed for up to 5 days. 118 mL 0  ? Carbinoxamine Maleate 4 MG TABS Take 1-1.5 tablets (4-6 mg total) by mouth 2 (two) times daily as needed for allergies. 60 tablet 5  ? cyclobenzaprine (FLEXERIL) 10 MG tablet One half to one tab PO qHS, then increase gradually to one tab TID. 30 tablet 0  ? EPINEPHrine 0.3 mg/0.3 mL IJ SOAJ injection Inject 0.3 mg into the muscle as needed for anaphylaxis. 2 each 2  ? fluticasone (FLONASE) 50 MCG/ACT nasal spray Place 2 sprays into both nostrils daily. 16 g 0  ?  fluticasone-salmeterol (ADVAIR DISKUS) 250-50 MCG/ACT AEPB Inhale 1 puff into the lungs in the morning and at bedtime. Rinse mouth after each use. 60 each 5  ? levonorgestrel (MIRENA) 20 MCG/24HR IUD 1 each by Intrauterine route once.    ? lidocaine (LIDODERM) 5 % Place 1 patch onto the skin daily. Remove & Discard patch within 12 hours or as directed by MD 30 patch 0  ? ondansetron (ZOFRAN) 4 MG tablet Take 1 tablet (4 mg total) by mouth every 6 (six) hours. 12 tablet 0  ? PARoxetine (PAXIL) 20 MG tablet Take 1 tablet by mouth at bedtime 30 tablet 11  ? traZODone (DESYREL) 50 MG tablet Take 1 to 3 tablets by mouth at bedtime (Patient taking differently: Take 50-150 mg by mouth at bedtime.) 90 tablet 11  ? ?No current facility-administered medications for this visit.  ? ?Allergies: ?No Known Allergies ?I reviewed her past medical history, social history, family history, and environmental history and no significant changes have been reported from her previous visit. ? ?Review of Systems  ?Constitutional:  Negative for appetite change, chills, fever and unexpected weight change.  ?HENT:  Negative for congestion and rhinorrhea.   ?Eyes:  Negative for itching.  ?Respiratory:  Negative for cough, chest tightness, shortness of breath and wheezing.   ?Cardiovascular:  Negative for chest pain.  ?Gastrointestinal:  Negative for abdominal pain.  ?Genitourinary:  Negative for difficulty urinating.  ?Skin:  Negative for rash.  ?Allergic/Immunologic: Positive for environmental allergies.  ?Neurological:  Negative for headaches.  ? ?Objective: ?There were no vitals taken for this visit. ?  There is no height or weight on file to calculate BMI. ?Physical Exam ?Vitals and nursing note reviewed.  ?Constitutional:   ?   Appearance: Normal appearance. She is well-developed.  ?HENT:  ?   Head: Normocephalic and atraumatic.  ?   Right Ear: Tympanic membrane and external ear normal.  ?   Left Ear: Tympanic membrane and external ear normal.   ?   Nose: Nose normal.  ?   Comments: Transverse nasal crease ?   Mouth/Throat:  ?   Mouth: Mucous membranes are moist.  ?   Pharynx: Oropharynx is clear.  ?Eyes:  ?   Conjunctiva/sclera: Conjunctiva

## 2021-05-08 ENCOUNTER — Ambulatory Visit: Payer: 59 | Admitting: Allergy

## 2021-05-08 DIAGNOSIS — H1010 Acute atopic conjunctivitis, unspecified eye: Secondary | ICD-10-CM

## 2021-05-08 DIAGNOSIS — J4540 Moderate persistent asthma, uncomplicated: Secondary | ICD-10-CM

## 2021-05-08 DIAGNOSIS — T7840XD Allergy, unspecified, subsequent encounter: Secondary | ICD-10-CM

## 2021-05-08 DIAGNOSIS — J309 Allergic rhinitis, unspecified: Secondary | ICD-10-CM

## 2021-05-09 ENCOUNTER — Ambulatory Visit (INDEPENDENT_AMBULATORY_CARE_PROVIDER_SITE_OTHER): Payer: Self-pay

## 2021-05-09 DIAGNOSIS — J309 Allergic rhinitis, unspecified: Secondary | ICD-10-CM

## 2021-05-10 ENCOUNTER — Ambulatory Visit: Payer: No Typology Code available for payment source | Admitting: Sports Medicine

## 2021-05-24 ENCOUNTER — Ambulatory Visit (INDEPENDENT_AMBULATORY_CARE_PROVIDER_SITE_OTHER): Payer: Self-pay

## 2021-05-24 DIAGNOSIS — J309 Allergic rhinitis, unspecified: Secondary | ICD-10-CM

## 2021-05-28 ENCOUNTER — Ambulatory Visit (INDEPENDENT_AMBULATORY_CARE_PROVIDER_SITE_OTHER): Payer: No Typology Code available for payment source

## 2021-05-28 DIAGNOSIS — J309 Allergic rhinitis, unspecified: Secondary | ICD-10-CM

## 2021-06-04 NOTE — Patient Instructions (Addendum)
Asthma: ?Stop Advair 250/50 due to only being able to remember to take dose once a day and poorly controlled asthma ?Daily controller medication(s): Breo 100 mcg 1 puff once a day to help prevent cough and wheeze.  Rinse mouth after each use.  ?May use albuterol rescue inhaler 2 puffs every 4 to 6 hours as needed for shortness of breath, chest tightness, coughing, and wheezing. May use albuterol rescue inhaler 2 puffs 5 to 15 minutes prior to strenuous physical activities. Monitor frequency of use.  ?Asthma control goals:  ?Full participation in all desired activities (may need albuterol before activity) ?Albuterol use two times or less a week on average (not counting use with activity) ?Cough interfering with sleep two times or less a month ?Oral steroids no more than once a year ?No hospitalizations ? ?Seasonal and perennial allergic rhinoconjunctivitis ?2022 skin testing showed: Positive to grass, weed pollen, ragweed pollen, trees, mold, and borderline to cat and dog.  ?Continue environmental control measures as below. ?Stop carbinoxamine 4mg  due to being to drying ?Start Xyzal (levocetirizine) 5 mg once  a day as needed for runny nose ?Continue allergy injections per protocol and have access to epinephrine auto injector device ? ?Allergic reaction: ?Avoid the clam & corn chowder with bacon canned soup. ?If you have another reaction then let know.  ?For mild symptoms you can take over the counter antihistamines such as Benadryl and monitor symptoms closely. If symptoms worsen or if you have severe symptoms including breathing issues, throat closure, significant swelling, whole body hives, severe diarrhea and vomiting, lightheadedness then inject epinephrine and seek immediate medical care afterwards. ?Action plan in place.  ? ?Follow up in 2 months or sooner if needed.  ? ?Reducing Pollen Exposure ?Pollen seasons: trees (spring), grass (summer) and ragweed/weeds (fall). ?Keep windows closed in your home and  car to lower pollen exposure.  ?Install air conditioning in the bedroom and throughout the house if possible.  ?Avoid going out in dry windy days - especially early morning. ?Pollen counts are highest between 5 - 10 AM and on dry, hot and windy days.  ?Save outside activities for late afternoon or after a heavy rain, when pollen levels are lower.  ?Avoid mowing of grass if you have grass pollen allergy. ?Be aware that pollen can also be transported indoors on people and pets.  ?Dry your clothes in an automatic dryer rather than hanging them outside where they might collect pollen.  ?Rinse hair and eyes before bedtime. ?Mold Control ?Mold and fungi can grow on a variety of surfaces provided certain temperature and moisture conditions exist.  ?Outdoor molds grow on plants, decaying vegetation and soil. The major outdoor mold, Alternaria and Cladosporium, are found in very high numbers during hot and dry conditions. Generally, a late summer - fall peak is seen for common outdoor fungal spores. Rain will temporarily lower outdoor mold spore count, but counts rise rapidly when the rainy period ends. ?The most important indoor molds are Aspergillus and Penicillium. Dark, humid and poorly ventilated basements are ideal sites for mold growth. The next most common sites of mold growth are the bathroom and the kitchen. ?Outdoor (Seasonal) Mold Control ?Use air conditioning and keep windows closed. ?Avoid exposure to decaying vegetation. ?Avoid leaf raking. ?Avoid grain handling. ?Consider wearing a face mask if working in moldy areas.  ?Indoor (Perennial) Mold Control  ?Maintain humidity below 50%. ?Get rid of mold growth on hard surfaces with water, detergent and, if necessary, 5% bleach (do  not mix with other cleaners). Then dry the area completely. If mold covers an area more than 10 square feet, consider hiring an indoor environmental professional. ?For clothing, washing with soap and water is best. If moldy items cannot  be cleaned and dried, throw them away. ?Remove sources e.g. contaminated carpets. ?Repair and seal leaking roofs or pipes. Using dehumidifiers in damp basements may be helpful, but empty the water and clean units regularly to prevent mildew from forming. All rooms, especially basements, bathrooms and kitchens, require ventilation and cleaning to deter mold and mildew growth. Avoid carpeting on concrete or damp floors, and storing items in damp areas. ?Pet Allergen Avoidance: ?Contrary to popular opinion, there are no ?hypoallergenic? breeds of dogs or cats. That is because people are not allergic to an animal?s hair, but to an allergen found in the animal's saliva, dander (dead skin flakes) or urine. Pet allergy symptoms typically occur within minutes. For some people, symptoms can build up and become most severe 8 to 12 hours after contact with the animal. People with severe allergies can experience reactions in public places if dander has been transported on the pet owners? clothing. ?Keeping an animal outdoors is only a partial solution, since homes with pets in the yard still have higher concentrations of animal allergens. ?Before getting a pet, ask your allergist to determine if you are allergic to animals. If your pet is already considered part of your family, try to minimize contact and keep the pet out of the bedroom and other rooms where you spend a great deal of time. ?As with dust mites, vacuum carpets often or replace carpet with a hardwood floor, tile or linoleum. ?High-efficiency particulate air (HEPA) cleaners can reduce allergen levels over time. ?While dander and saliva are the source of cat and dog allergens, urine is the source of allergens from rabbits, hamsters, mice and [place] pigs; so ask a non-allergic family member to clean the animal?s cage. ?If you have a pet allergy, talk to your allergist about the potential for allergy immunotherapy (allergy shots). This strategy can often provide  long-term relief. ? ? ?

## 2021-06-05 ENCOUNTER — Ambulatory Visit (INDEPENDENT_AMBULATORY_CARE_PROVIDER_SITE_OTHER): Payer: No Typology Code available for payment source | Admitting: Sports Medicine

## 2021-06-05 DIAGNOSIS — M25511 Pain in right shoulder: Secondary | ICD-10-CM | POA: Diagnosis not present
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
## 2021-06-06 ENCOUNTER — Other Ambulatory Visit (HOSPITAL_COMMUNITY): Payer: Self-pay

## 2021-06-06 ENCOUNTER — Telehealth: Payer: Self-pay

## 2021-06-06 ENCOUNTER — Other Ambulatory Visit (HOSPITAL_BASED_OUTPATIENT_CLINIC_OR_DEPARTMENT_OTHER): Payer: Self-pay

## 2021-06-06 ENCOUNTER — Encounter: Payer: Self-pay | Admitting: Family

## 2021-06-06 ENCOUNTER — Ambulatory Visit: Payer: No Typology Code available for payment source | Admitting: Family

## 2021-06-06 ENCOUNTER — Other Ambulatory Visit: Payer: Self-pay | Admitting: Allergy

## 2021-06-06 ENCOUNTER — Ambulatory Visit: Payer: Self-pay

## 2021-06-06 VITALS — BP 130/72 | HR 90 | Temp 98.7°F | Resp 16 | Ht 60.0 in | Wt 174.1 lb

## 2021-06-06 DIAGNOSIS — T7840XD Allergy, unspecified, subsequent encounter: Secondary | ICD-10-CM

## 2021-06-06 DIAGNOSIS — H1013 Acute atopic conjunctivitis, bilateral: Secondary | ICD-10-CM

## 2021-06-06 DIAGNOSIS — J309 Allergic rhinitis, unspecified: Secondary | ICD-10-CM

## 2021-06-06 DIAGNOSIS — J4540 Moderate persistent asthma, uncomplicated: Secondary | ICD-10-CM | POA: Diagnosis not present

## 2021-06-06 DIAGNOSIS — H1010 Acute atopic conjunctivitis, unspecified eye: Secondary | ICD-10-CM

## 2021-06-06 MED ORDER — FLUTICASONE-SALMETEROL 250-50 MCG/ACT IN AEPB
1.0000 | INHALATION_SPRAY | Freq: Two times a day (BID) | RESPIRATORY_TRACT | 5 refills | Status: DC
  Filled 2021-06-06: qty 60, 30d supply, fill #0
  Filled 2021-07-04 – 2021-07-19 (×2): qty 60, 30d supply, fill #1
  Filled 2021-08-11: qty 60, 30d supply, fill #2
  Filled 2022-01-28: qty 60, 30d supply, fill #3
  Filled 2022-05-22: qty 60, 30d supply, fill #4

## 2021-06-06 MED ORDER — FLUTICASONE FUROATE-VILANTEROL 100-25 MCG/ACT IN AEPB
1.0000 | INHALATION_SPRAY | Freq: Every day | RESPIRATORY_TRACT | 2 refills | Status: DC
  Filled 2021-06-06: qty 60, 30d supply, fill #0

## 2021-06-06 MED ORDER — LEVOCETIRIZINE DIHYDROCHLORIDE 5 MG PO TABS
5.0000 mg | ORAL_TABLET | Freq: Every evening | ORAL | 2 refills | Status: DC
Start: 2021-06-06 — End: 2023-01-19
  Filled 2021-06-06: qty 30, 30d supply, fill #0
  Filled 2021-07-04 – 2021-08-11 (×2): qty 30, 30d supply, fill #1

## 2021-06-06 NOTE — Telephone Encounter (Signed)
Called [facility] spoke to [place] to followed up to make sure the advair 250/50 one puff twice a day was ordered and not the breo 100 mcg. They d/c the breo 100 mcg bc it would cost the patient 73.76 oppose to the advair 250/50 mcg for 5.00 dollars. I did speak to the patient and she will work really hard to be compliant to take her 1 puff twice a day and will rinse, gargle and spit.  ?

## 2021-06-06 NOTE — Telephone Encounter (Signed)
Ok to refill Advair 250/50 1 puff twice a day.
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
## 2021-06-07 ENCOUNTER — Other Ambulatory Visit (HOSPITAL_COMMUNITY): Payer: Self-pay

## 2021-06-07 MED ORDER — VYVANSE 60 MG PO CAPS
60.0000 mg | ORAL_CAPSULE | Freq: Every morning | ORAL | 0 refills | Status: DC
Start: 2021-06-07 — End: 2022-12-03
  Filled 2021-06-07 – 2021-06-10 (×2): qty 30, 30d supply, fill #0

## 2021-06-07 NOTE — Telephone Encounter (Signed)
Thank you :)

## 2021-06-10 ENCOUNTER — Other Ambulatory Visit (HOSPITAL_COMMUNITY): Payer: Self-pay

## 2021-06-11 ENCOUNTER — Ambulatory Visit: Payer: No Typology Code available for payment source | Admitting: Critical Care Medicine

## 2021-06-11 ENCOUNTER — Ambulatory Visit (INDEPENDENT_AMBULATORY_CARE_PROVIDER_SITE_OTHER): Payer: No Typology Code available for payment source

## 2021-06-11 DIAGNOSIS — J309 Allergic rhinitis, unspecified: Secondary | ICD-10-CM

## 2021-06-20 ENCOUNTER — Ambulatory Visit (INDEPENDENT_AMBULATORY_CARE_PROVIDER_SITE_OTHER): Payer: No Typology Code available for payment source

## 2021-06-20 DIAGNOSIS — J309 Allergic rhinitis, unspecified: Secondary | ICD-10-CM | POA: Diagnosis not present

## 2021-06-25 ENCOUNTER — Ambulatory Visit (INDEPENDENT_AMBULATORY_CARE_PROVIDER_SITE_OTHER): Payer: No Typology Code available for payment source

## 2021-06-25 ENCOUNTER — Ambulatory Visit: Payer: No Typology Code available for payment source | Admitting: Medical-Surgical

## 2021-06-25 DIAGNOSIS — J309 Allergic rhinitis, unspecified: Secondary | ICD-10-CM | POA: Diagnosis not present

## 2021-07-04 ENCOUNTER — Other Ambulatory Visit (HOSPITAL_COMMUNITY): Payer: Self-pay

## 2021-07-04 ENCOUNTER — Ambulatory Visit (INDEPENDENT_AMBULATORY_CARE_PROVIDER_SITE_OTHER): Payer: No Typology Code available for payment source

## 2021-07-04 DIAGNOSIS — J309 Allergic rhinitis, unspecified: Secondary | ICD-10-CM | POA: Diagnosis not present

## 2021-07-05 ENCOUNTER — Other Ambulatory Visit (HOSPITAL_COMMUNITY): Payer: Self-pay

## 2021-07-05 MED ORDER — VYVANSE 60 MG PO CAPS
60.0000 mg | ORAL_CAPSULE | Freq: Every morning | ORAL | 0 refills | Status: DC
  Filled 2021-07-10 – 2021-07-19 (×2): qty 30, 30d supply, fill #0

## 2021-07-08 ENCOUNTER — Ambulatory Visit (INDEPENDENT_AMBULATORY_CARE_PROVIDER_SITE_OTHER): Payer: No Typology Code available for payment source

## 2021-07-08 ENCOUNTER — Other Ambulatory Visit (HOSPITAL_COMMUNITY): Payer: Self-pay

## 2021-07-08 DIAGNOSIS — J309 Allergic rhinitis, unspecified: Secondary | ICD-10-CM | POA: Diagnosis not present

## 2021-07-09 ENCOUNTER — Other Ambulatory Visit (HOSPITAL_COMMUNITY): Payer: Self-pay

## 2021-07-10 ENCOUNTER — Other Ambulatory Visit (HOSPITAL_COMMUNITY): Payer: Self-pay

## 2021-07-10 MED ORDER — PAROXETINE HCL 20 MG PO TABS
20.0000 mg | ORAL_TABLET | Freq: Every evening | ORAL | 2 refills | Status: DC
  Filled 2021-07-10 – 2021-07-19 (×2): qty 30, 30d supply, fill #0
  Filled 2021-08-11: qty 30, 30d supply, fill #1
  Filled 2022-01-28: qty 30, 30d supply, fill #2

## 2021-07-10 MED ORDER — TRAZODONE HCL 50 MG PO TABS
50.0000 mg | ORAL_TABLET | Freq: Every evening | ORAL | 0 refills | Status: DC | PRN
  Filled 2021-07-10 – 2021-07-19 (×2): qty 90, 30d supply, fill #0

## 2021-07-16 ENCOUNTER — Other Ambulatory Visit (HOSPITAL_COMMUNITY): Payer: Self-pay

## 2021-07-17 ENCOUNTER — Ambulatory Visit (INDEPENDENT_AMBULATORY_CARE_PROVIDER_SITE_OTHER): Payer: No Typology Code available for payment source

## 2021-07-17 DIAGNOSIS — J309 Allergic rhinitis, unspecified: Secondary | ICD-10-CM | POA: Diagnosis not present

## 2021-07-18 ENCOUNTER — Other Ambulatory Visit (HOSPITAL_COMMUNITY): Payer: Self-pay

## 2021-07-19 ENCOUNTER — Other Ambulatory Visit (HOSPITAL_COMMUNITY): Payer: Self-pay

## 2021-08-02 ENCOUNTER — Ambulatory Visit (INDEPENDENT_AMBULATORY_CARE_PROVIDER_SITE_OTHER): Payer: No Typology Code available for payment source

## 2021-08-02 DIAGNOSIS — J309 Allergic rhinitis, unspecified: Secondary | ICD-10-CM | POA: Diagnosis not present

## 2021-08-06 ENCOUNTER — Ambulatory Visit (INDEPENDENT_AMBULATORY_CARE_PROVIDER_SITE_OTHER): Payer: No Typology Code available for payment source

## 2021-08-06 DIAGNOSIS — J309 Allergic rhinitis, unspecified: Secondary | ICD-10-CM | POA: Diagnosis not present

## 2021-08-11 ENCOUNTER — Other Ambulatory Visit (HOSPITAL_COMMUNITY): Payer: Self-pay

## 2021-08-12 ENCOUNTER — Other Ambulatory Visit (HOSPITAL_COMMUNITY): Payer: Self-pay

## 2021-08-12 MED ORDER — VYVANSE 60 MG PO CAPS
60.0000 mg | ORAL_CAPSULE | Freq: Every morning | ORAL | 0 refills | Status: DC
  Filled 2021-08-19: qty 30, 30d supply, fill #0

## 2021-08-15 ENCOUNTER — Ambulatory Visit (INDEPENDENT_AMBULATORY_CARE_PROVIDER_SITE_OTHER): Payer: No Typology Code available for payment source

## 2021-08-15 DIAGNOSIS — J309 Allergic rhinitis, unspecified: Secondary | ICD-10-CM | POA: Diagnosis not present

## 2021-08-19 ENCOUNTER — Other Ambulatory Visit (HOSPITAL_COMMUNITY): Payer: Self-pay

## 2021-08-19 MED ORDER — TRAZODONE HCL 50 MG PO TABS
50.0000 mg | ORAL_TABLET | Freq: Every evening | ORAL | 1 refills | Status: DC | PRN
  Filled 2021-08-19: qty 90, 30d supply, fill #0

## 2021-08-19 MED ORDER — TRAZODONE HCL 50 MG PO TABS
50.0000 mg | ORAL_TABLET | Freq: Every day | ORAL | 1 refills | Status: DC
  Filled 2021-08-19: qty 90, 30d supply, fill #0

## 2021-08-25 NOTE — Progress Notes (Unsigned)
New Patient Office Visit  Subjective:  Patient ID: Susan Boyle, female    DOB: 07/18/1990  Age: 31 y.o. MRN: [id number]  CC: No chief complaint on file.    HPI Susan Boyle presents to establish care.   Past Medical History:  Diagnosis Date   Anxiety    Asthma    Back pain, chronic    Depression    Knee pain, chronic     Past Surgical History:  Procedure Laterality Date   HERNIA REPAIR     inguinal hernia repair as infant   NERVE, TENDON AND ARTERY REPAIR Left 07/20/2013   Procedure: NERVE,  REPAIR;  Surgeon: Nicki Reaper, MD;  Location: [facility];  Service: Orthopedics;  Laterality: Left;    Family History  Adopted: Yes  Problem Relation Age of Onset   Heart disease Father 34       MI    Social History   Socioeconomic History   Marital status: Single    Spouse name: Not on file   Number of children: Not on file   Years of education: Not on file   Highest education level: Not on file  Occupational History   Not on file  Tobacco Use   Smoking status: Former    Types: Cigarettes   Smokeless tobacco: Never   Tobacco comments:    stopped in November 2021   Substance and Sexual Activity   Alcohol use: Yes    Comment: social   Drug use: No   Sexual activity: Yes    Birth control/protection: I.U.D.  Other Topics Concern   Not on file  Social History Narrative   Not on file   Social Determinants of Health   Financial Resource Strain: Not on file  Food Insecurity: Not on file  Transportation Needs: Not on file  Physical Activity: Not on file  Stress: Not on file  Social Connections: Not on file  Intimate Partner Violence: Not on file    ROS Review of Systems  Objective:   Today's Vitals: There were no vitals taken for this visit.  Physical Exam  Assessment & Plan:   There are no diagnoses linked to this encounter.  Outpatient Encounter Medications as of 08/26/2021  Medication Sig   albuterol (VENTOLIN HFA) 108 (90  Base) MCG/ACT inhaler Inhale 2 puffs into the lungs every 4 (four) hours as needed for wheezing or shortness of breath (coughing fits).   Amphetamine ER (DYANAVEL XR) 20 MG CHER Take 1 tablet by mouth every morning.   Carbinoxamine Maleate 4 MG TABS Take 1-1.5 tablets (4-6 mg total) by mouth 2 (two) times daily as needed for allergies.   cyclobenzaprine (FLEXERIL) 10 MG tablet One half to one tab PO qHS, then increase gradually to one tab TID.   EPINEPHrine 0.3 mg/0.3 mL IJ SOAJ injection Inject 0.3 mg into the muscle as needed for anaphylaxis.   fluticasone (FLONASE) 50 MCG/ACT nasal spray Place 2 sprays into both nostrils daily. (Patient not taking: Reported on 06/06/2021)   fluticasone furoate-vilanterol (BREO ELLIPTA) 100-25 MCG/ACT AEPB Inhale 1 puff into the lungs daily.   fluticasone-salmeterol (ADVAIR DISKUS) 250-50 MCG/ACT AEPB Inhale 1 puff into the lungs in the morning and at bedtime. Rinse mouth after each use.   levocetirizine (XYZAL) 5 MG tablet Take 1 tablet (5 mg total) by mouth every evening.   levonorgestrel (MIRENA) 20 MCG/24HR IUD 1 each by Intrauterine route once.   lidocaine (LIDODERM) 5 % Place 1 patch  onto the skin daily. Remove & Discard patch within 12 hours or as directed by MD   lisdexamfetamine (VYVANSE) 60 MG capsule Take 1 capsule (60 mg total) by mouth in the morning.   lisdexamfetamine (VYVANSE) 60 MG capsule Take 1 capsule (60 mg total) by mouth in the morning.   ondansetron (ZOFRAN) 4 MG tablet Take 1 tablet (4 mg total) by mouth every 6 (six) hours.   PARoxetine (PAXIL) 20 MG tablet Take 1 tablet by mouth at bedtime   PARoxetine (PAXIL) 20 MG tablet Take 1 tablet (20 mg total) by mouth at bedtime.   traZODone (DESYREL) 50 MG tablet Take 1 to 3 tablets by mouth at bedtime (Patient taking differently: Take 50-150 mg by mouth at bedtime.)   traZODone (DESYREL) 50 MG tablet Take 1-3 tablets (50-150 mg total) by mouth at bedtime as needed.   traZODone (DESYREL) 50 MG  tablet Take 1-3 tablets (50-150 mg total) by mouth at bedtime as needed.   No facility-administered encounter medications on file as of 08/26/2021.    Follow-up: No follow-ups on file.   Thayer Ohm, DNP, APRN, FNP-BC [facility]

## 2021-08-26 ENCOUNTER — Ambulatory Visit (INDEPENDENT_AMBULATORY_CARE_PROVIDER_SITE_OTHER): Payer: Self-pay | Admitting: Medical-Surgical

## 2021-08-26 DIAGNOSIS — Z91199 Patient's noncompliance with other medical treatment and regimen due to unspecified reason: Secondary | ICD-10-CM

## 2021-08-28 ENCOUNTER — Other Ambulatory Visit (HOSPITAL_COMMUNITY): Payer: Self-pay

## 2021-08-29 ENCOUNTER — Ambulatory Visit: Payer: No Typology Code available for payment source | Admitting: Family

## 2021-08-29 DIAGNOSIS — J309 Allergic rhinitis, unspecified: Secondary | ICD-10-CM

## 2021-09-06 ENCOUNTER — Other Ambulatory Visit (HOSPITAL_COMMUNITY): Payer: Self-pay

## 2021-09-13 ENCOUNTER — Ambulatory Visit (INDEPENDENT_AMBULATORY_CARE_PROVIDER_SITE_OTHER): Payer: No Typology Code available for payment source

## 2021-09-13 DIAGNOSIS — J309 Allergic rhinitis, unspecified: Secondary | ICD-10-CM

## 2021-09-16 ENCOUNTER — Telehealth: Payer: No Typology Code available for payment source | Admitting: Physician Assistant

## 2021-09-16 DIAGNOSIS — H109 Unspecified conjunctivitis: Secondary | ICD-10-CM

## 2021-09-16 MED ORDER — POLYMYXIN B-TRIMETHOPRIM 10000-0.1 UNIT/ML-% OP SOLN: 1.0000 [drp] | OPHTHALMIC | 0 refills | Status: DC

## 2021-09-16 NOTE — Progress Notes (Signed)

## 2021-09-27 ENCOUNTER — Ambulatory Visit (INDEPENDENT_AMBULATORY_CARE_PROVIDER_SITE_OTHER): Payer: No Typology Code available for payment source

## 2021-09-27 DIAGNOSIS — J309 Allergic rhinitis, unspecified: Secondary | ICD-10-CM | POA: Diagnosis not present

## 2021-10-01 ENCOUNTER — Other Ambulatory Visit (HOSPITAL_COMMUNITY): Payer: Self-pay

## 2021-10-01 MED ORDER — VYVANSE 60 MG PO CAPS
60.0000 mg | ORAL_CAPSULE | Freq: Every morning | ORAL | 0 refills | Status: DC
  Filled 2021-10-01: qty 30, 30d supply, fill #0

## 2021-10-02 ENCOUNTER — Other Ambulatory Visit (HOSPITAL_COMMUNITY): Payer: Self-pay

## 2021-10-02 MED ORDER — VYVANSE 60 MG PO CAPS
60.0000 mg | ORAL_CAPSULE | Freq: Every morning | ORAL | 0 refills | Status: DC
  Filled 2021-12-19: qty 30, 30d supply, fill #0

## 2021-10-04 ENCOUNTER — Ambulatory Visit (INDEPENDENT_AMBULATORY_CARE_PROVIDER_SITE_OTHER): Payer: No Typology Code available for payment source

## 2021-10-04 DIAGNOSIS — J309 Allergic rhinitis, unspecified: Secondary | ICD-10-CM | POA: Diagnosis not present

## 2021-10-10 ENCOUNTER — Other Ambulatory Visit (HOSPITAL_COMMUNITY): Payer: Self-pay

## 2021-10-11 ENCOUNTER — Ambulatory Visit (INDEPENDENT_AMBULATORY_CARE_PROVIDER_SITE_OTHER): Payer: No Typology Code available for payment source | Admitting: *Deleted

## 2021-10-11 DIAGNOSIS — J309 Allergic rhinitis, unspecified: Secondary | ICD-10-CM | POA: Diagnosis not present

## 2021-10-14 ENCOUNTER — Ambulatory Visit (INDEPENDENT_AMBULATORY_CARE_PROVIDER_SITE_OTHER): Payer: No Typology Code available for payment source | Admitting: *Deleted

## 2021-10-14 DIAGNOSIS — J309 Allergic rhinitis, unspecified: Secondary | ICD-10-CM | POA: Diagnosis not present

## 2021-10-16 ENCOUNTER — Other Ambulatory Visit (HOSPITAL_COMMUNITY): Payer: Self-pay

## 2021-10-16 MED ORDER — PAROXETINE HCL 20 MG PO TABS
20.0000 mg | ORAL_TABLET | Freq: Every day | ORAL | 4 refills | Status: DC
  Filled 2021-10-16: qty 90, 90d supply, fill #0
  Filled 2022-01-28 – 2022-05-22 (×2): qty 90, 90d supply, fill #1

## 2021-10-16 MED ORDER — VYVANSE 60 MG PO CHEW
60.0000 mg | CHEWABLE_TABLET | Freq: Every morning | ORAL | 0 refills | Status: DC
  Filled 2021-10-16: qty 30, 30d supply, fill #0

## 2021-10-16 MED ORDER — TRAZODONE HCL 50 MG PO TABS
50.0000 mg | ORAL_TABLET | Freq: Every evening | ORAL | 4 refills | Status: DC | PRN
  Filled 2021-10-16: qty 270, 90d supply, fill #0
  Filled 2022-01-28: qty 270, 90d supply, fill #1

## 2021-10-24 ENCOUNTER — Ambulatory Visit (INDEPENDENT_AMBULATORY_CARE_PROVIDER_SITE_OTHER): Payer: No Typology Code available for payment source

## 2021-10-24 DIAGNOSIS — J309 Allergic rhinitis, unspecified: Secondary | ICD-10-CM

## 2021-10-24 NOTE — Progress Notes (Signed)
VIALS EXP 10-25-22 

## 2021-10-25 DIAGNOSIS — J3081 Allergic rhinitis due to animal (cat) (dog) hair and dander: Secondary | ICD-10-CM | POA: Diagnosis not present

## 2021-10-29 ENCOUNTER — Ambulatory Visit (INDEPENDENT_AMBULATORY_CARE_PROVIDER_SITE_OTHER): Payer: No Typology Code available for payment source

## 2021-10-29 DIAGNOSIS — J309 Allergic rhinitis, unspecified: Secondary | ICD-10-CM

## 2021-10-30 ENCOUNTER — Encounter: Payer: Self-pay | Admitting: Allergy

## 2021-11-21 ENCOUNTER — Ambulatory Visit (INDEPENDENT_AMBULATORY_CARE_PROVIDER_SITE_OTHER): Payer: No Typology Code available for payment source

## 2021-11-21 DIAGNOSIS — J309 Allergic rhinitis, unspecified: Secondary | ICD-10-CM

## 2021-11-25 ENCOUNTER — Other Ambulatory Visit (HOSPITAL_COMMUNITY): Payer: Self-pay

## 2021-11-25 MED ORDER — TRIAZOLAM 0.25 MG PO TABS
0.2500 mg | ORAL_TABLET | Freq: Once | ORAL | 0 refills | Status: DC
  Filled 2021-11-25: qty 1, 1d supply, fill #0

## 2021-11-25 MED ORDER — AMOXICILLIN 500 MG PO CAPS
2000.0000 mg | ORAL_CAPSULE | ORAL | 0 refills | Status: DC
  Filled 2021-11-25: qty 21, 6d supply, fill #0

## 2021-11-25 MED ORDER — IBUPROFEN 600 MG PO TABS
600.0000 mg | ORAL_TABLET | Freq: Four times a day (QID) | ORAL | 0 refills | Status: DC
  Filled 2021-11-25: qty 30, 8d supply, fill #0

## 2021-11-26 ENCOUNTER — Ambulatory Visit (INDEPENDENT_AMBULATORY_CARE_PROVIDER_SITE_OTHER): Payer: No Typology Code available for payment source

## 2021-11-26 ENCOUNTER — Other Ambulatory Visit (HOSPITAL_COMMUNITY): Payer: Self-pay

## 2021-11-26 DIAGNOSIS — J309 Allergic rhinitis, unspecified: Secondary | ICD-10-CM

## 2021-12-06 ENCOUNTER — Ambulatory Visit (INDEPENDENT_AMBULATORY_CARE_PROVIDER_SITE_OTHER): Payer: No Typology Code available for payment source | Admitting: *Deleted

## 2021-12-06 DIAGNOSIS — J309 Allergic rhinitis, unspecified: Secondary | ICD-10-CM | POA: Diagnosis not present

## 2021-12-18 ENCOUNTER — Ambulatory Visit (INDEPENDENT_AMBULATORY_CARE_PROVIDER_SITE_OTHER): Payer: No Typology Code available for payment source | Admitting: *Deleted

## 2021-12-18 DIAGNOSIS — J309 Allergic rhinitis, unspecified: Secondary | ICD-10-CM

## 2021-12-19 ENCOUNTER — Other Ambulatory Visit (HOSPITAL_COMMUNITY): Payer: Self-pay

## 2021-12-23 ENCOUNTER — Ambulatory Visit (INDEPENDENT_AMBULATORY_CARE_PROVIDER_SITE_OTHER): Payer: No Typology Code available for payment source

## 2021-12-23 DIAGNOSIS — J309 Allergic rhinitis, unspecified: Secondary | ICD-10-CM

## 2022-01-02 ENCOUNTER — Ambulatory Visit (INDEPENDENT_AMBULATORY_CARE_PROVIDER_SITE_OTHER): Payer: No Typology Code available for payment source

## 2022-01-02 DIAGNOSIS — J309 Allergic rhinitis, unspecified: Secondary | ICD-10-CM | POA: Diagnosis not present

## 2022-01-07 ENCOUNTER — Emergency Department (HOSPITAL_COMMUNITY): Payer: No Typology Code available for payment source

## 2022-01-07 ENCOUNTER — Encounter (HOSPITAL_COMMUNITY): Payer: Self-pay

## 2022-01-07 ENCOUNTER — Emergency Department (HOSPITAL_COMMUNITY)
Admission: EM | Admit: 2022-01-07 | Discharge: 2022-01-07 | Disposition: A | Discharge status: Home / Self Care | Payer: No Typology Code available for payment source | Attending: Emergency Medicine | Admitting: Emergency Medicine

## 2022-01-07 DIAGNOSIS — Y92410 Unspecified street and highway as the place of occurrence of the external cause: Secondary | ICD-10-CM | POA: Diagnosis not present

## 2022-01-07 DIAGNOSIS — S4351XA Sprain of right acromioclavicular joint, initial encounter: Secondary | ICD-10-CM | POA: Insufficient documentation

## 2022-01-07 DIAGNOSIS — S4991XA Unspecified injury of right shoulder and upper arm, initial encounter: Secondary | ICD-10-CM | POA: Diagnosis present

## 2022-01-07 DIAGNOSIS — S161XXA Strain of muscle, fascia and tendon at neck level, initial encounter: Secondary | ICD-10-CM | POA: Insufficient documentation

## 2022-01-07 DIAGNOSIS — M25511 Pain in right shoulder: Secondary | ICD-10-CM

## 2022-01-07 LAB — I-STAT BETA HCG BLOOD, ED (MC, WL, AP ONLY): I-stat hCG, quantitative: <5 m[IU]/mL (ref <5)

## 2022-01-07 MED ORDER — HYDROCODONE-ACETAMINOPHEN 5-325 MG PO TABS
1.0000 | ORAL_TABLET | Freq: Once | ORAL | Status: AC
  Administered 2022-01-07: 1 via ORAL
  Filled 2022-01-07: qty 1

## 2022-01-07 MED ORDER — KETOROLAC TROMETHAMINE 60 MG/2ML IM SOLN
30.0000 mg | Freq: Once | INTRAMUSCULAR | Status: AC
  Administered 2022-01-07: 30 mg via INTRAMUSCULAR
  Filled 2022-01-07: qty 2
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
## 2022-01-21 ENCOUNTER — Ambulatory Visit (INDEPENDENT_AMBULATORY_CARE_PROVIDER_SITE_OTHER): Payer: No Typology Code available for payment source | Admitting: *Deleted

## 2022-01-21 DIAGNOSIS — J309 Allergic rhinitis, unspecified: Secondary | ICD-10-CM

## 2022-01-28 ENCOUNTER — Other Ambulatory Visit (HOSPITAL_COMMUNITY): Payer: Self-pay

## 2022-01-28 MED ORDER — LISDEXAMFETAMINE DIMESYLATE 60 MG PO CAPS
60.0000 mg | ORAL_CAPSULE | Freq: Every morning | ORAL | 0 refills | Status: DC
  Filled 2022-01-28: qty 30, 30d supply, fill #0

## 2022-01-29 ENCOUNTER — Other Ambulatory Visit (HOSPITAL_COMMUNITY): Payer: Self-pay

## 2022-01-30 ENCOUNTER — Ambulatory Visit: Payer: Self-pay

## 2022-03-02 ENCOUNTER — Other Ambulatory Visit (HOSPITAL_COMMUNITY): Payer: Self-pay

## 2022-03-03 ENCOUNTER — Other Ambulatory Visit (HOSPITAL_COMMUNITY): Payer: Self-pay

## 2022-03-03 MED ORDER — LISDEXAMFETAMINE DIMESYLATE 60 MG PO CAPS
60.0000 mg | ORAL_CAPSULE | Freq: Every morning | ORAL | 0 refills | Status: DC
  Filled 2022-03-03: qty 30, 30d supply, fill #0

## 2022-03-14 ENCOUNTER — Ambulatory Visit (INDEPENDENT_AMBULATORY_CARE_PROVIDER_SITE_OTHER): Payer: 59

## 2022-03-14 DIAGNOSIS — J309 Allergic rhinitis, unspecified: Secondary | ICD-10-CM | POA: Diagnosis not present

## 2022-03-27 ENCOUNTER — Ambulatory Visit (INDEPENDENT_AMBULATORY_CARE_PROVIDER_SITE_OTHER): Payer: 59

## 2022-03-27 DIAGNOSIS — J309 Allergic rhinitis, unspecified: Secondary | ICD-10-CM | POA: Diagnosis not present

## 2022-04-01 ENCOUNTER — Ambulatory Visit (INDEPENDENT_AMBULATORY_CARE_PROVIDER_SITE_OTHER): Payer: 59

## 2022-04-01 DIAGNOSIS — J309 Allergic rhinitis, unspecified: Secondary | ICD-10-CM | POA: Diagnosis not present

## 2022-04-07 ENCOUNTER — Other Ambulatory Visit (HOSPITAL_COMMUNITY): Payer: Self-pay

## 2022-04-07 MED ORDER — LISDEXAMFETAMINE DIMESYLATE 60 MG PO CAPS
60.0000 mg | ORAL_CAPSULE | Freq: Every day | ORAL | 0 refills | Status: DC
  Filled 2022-04-07: qty 30, 30d supply, fill #0

## 2022-04-10 ENCOUNTER — Ambulatory Visit (INDEPENDENT_AMBULATORY_CARE_PROVIDER_SITE_OTHER): Payer: 59

## 2022-04-10 DIAGNOSIS — J309 Allergic rhinitis, unspecified: Secondary | ICD-10-CM

## 2022-04-14 ENCOUNTER — Ambulatory Visit (INDEPENDENT_AMBULATORY_CARE_PROVIDER_SITE_OTHER): Payer: 59

## 2022-04-14 DIAGNOSIS — J309 Allergic rhinitis, unspecified: Secondary | ICD-10-CM | POA: Diagnosis not present

## 2022-04-24 ENCOUNTER — Ambulatory Visit (INDEPENDENT_AMBULATORY_CARE_PROVIDER_SITE_OTHER): Payer: 59

## 2022-04-24 DIAGNOSIS — J309 Allergic rhinitis, unspecified: Secondary | ICD-10-CM

## 2022-04-28 ENCOUNTER — Ambulatory Visit (INDEPENDENT_AMBULATORY_CARE_PROVIDER_SITE_OTHER): Payer: 59

## 2022-04-28 DIAGNOSIS — J309 Allergic rhinitis, unspecified: Secondary | ICD-10-CM

## 2022-05-03 ENCOUNTER — Telehealth: Payer: 59 | Admitting: Physician Assistant

## 2022-05-03 DIAGNOSIS — N3000 Acute cystitis without hematuria: Secondary | ICD-10-CM

## 2022-05-03 MED ORDER — NITROFURANTOIN MONOHYD MACRO 100 MG PO CAPS: 100.0000 mg | ORAL_CAPSULE | Freq: Two times a day (BID) | ORAL | 0 refills | Status: AC

## 2022-05-03 NOTE — Progress Notes (Signed)
E-Visit for Urinary Problems  We are sorry that you are not feeling well.  Here is how we plan to help!  Based on what you shared with me it looks like you most likely have a simple urinary tract infection.  A UTI (Urinary Tract Infection) is a bacterial infection of the bladder.  Most cases of urinary tract infections are simple to treat but a key part of your care is to encourage you to drink plenty of fluids and watch your symptoms carefully.  I have prescribed MacroBid 100 mg twice a day for 5 days.  Your symptoms should gradually improve. Call us if the burning in your urine worsens, you develop worsening fever, back pain or pelvic pain or if your symptoms do not resolve after completing the antibiotic.  Urinary tract infections can be prevented by drinking plenty of water to keep your body hydrated.  Also be sure when you wipe, wipe from front to back and don't hold it in!  If possible, empty your bladder every 4 hours.  HOME CARE Drink plenty of fluids Compete the full course of the antibiotics even if the symptoms resolve Remember, when you need to go.go. Holding in your urine can increase the likelihood of getting a UTI! GET HELP RIGHT AWAY IF: You cannot urinate You get a high fever Worsening back pain occurs You see blood in your urine You feel sick to your stomach or throw up You feel like you are going to pass out  MAKE SURE YOU  Understand these instructions. Will watch your condition. Will get help right away if you are not doing well or get worse.   Thank you for choosing an e-visit.  Your e-visit answers were reviewed by a board certified advanced clinical practitioner to complete your personal care plan. Depending upon the condition, your plan could have included both over the counter or prescription medications.  Please review your pharmacy choice. Make sure the pharmacy is open so you can pick up prescription now. If there is a problem, you may contact your  provider through MyChart messaging and have the prescription routed to another pharmacy.  Your safety is important to us. If you have drug allergies check your prescription carefully.   For the next 24 hours you can use MyChart to ask questions about today's visit, request a non-urgent call back, or ask for a work or school excuse. You will get an email in the next two days asking about your experience. I hope that your e-visit has been valuable and will speed your recovery.   I have spent 5 minutes in review of e-visit questionnaire, review and updating patient chart, medical decision making and response to patient.   Kimarion Chery Z Ward, PA-C    

## 2022-05-08 ENCOUNTER — Ambulatory Visit (INDEPENDENT_AMBULATORY_CARE_PROVIDER_SITE_OTHER): Payer: 59

## 2022-05-08 DIAGNOSIS — J309 Allergic rhinitis, unspecified: Secondary | ICD-10-CM | POA: Diagnosis not present

## 2022-05-12 ENCOUNTER — Ambulatory Visit (INDEPENDENT_AMBULATORY_CARE_PROVIDER_SITE_OTHER): Payer: 59 | Admitting: *Deleted

## 2022-05-12 DIAGNOSIS — J309 Allergic rhinitis, unspecified: Secondary | ICD-10-CM

## 2022-05-13 DIAGNOSIS — J3081 Allergic rhinitis due to animal (cat) (dog) hair and dander: Secondary | ICD-10-CM | POA: Diagnosis not present

## 2022-05-13 NOTE — Progress Notes (Signed)
VIALS EXP 05-13-23

## 2022-05-22 ENCOUNTER — Other Ambulatory Visit (HOSPITAL_COMMUNITY): Payer: Self-pay

## 2022-05-22 ENCOUNTER — Other Ambulatory Visit: Payer: Self-pay | Admitting: Allergy

## 2022-05-22 ENCOUNTER — Other Ambulatory Visit: Payer: Self-pay

## 2022-05-22 MED ORDER — LISDEXAMFETAMINE DIMESYLATE 60 MG PO CAPS
60.0000 mg | ORAL_CAPSULE | Freq: Every day | ORAL | 0 refills | Status: DC
  Filled 2022-05-22: qty 30, 30d supply, fill #0

## 2022-05-22 MED ORDER — ALBUTEROL SULFATE HFA 108 (90 BASE) MCG/ACT IN AERS
2.0000 | INHALATION_SPRAY | RESPIRATORY_TRACT | 1 refills | Status: DC | PRN
  Filled 2022-05-22: qty 6.7, 25d supply, fill #0
  Filled 2022-07-11: qty 6.7, 25d supply, fill #1

## 2022-05-26 ENCOUNTER — Ambulatory Visit (INDEPENDENT_AMBULATORY_CARE_PROVIDER_SITE_OTHER): Payer: 59 | Admitting: *Deleted

## 2022-05-26 DIAGNOSIS — J309 Allergic rhinitis, unspecified: Secondary | ICD-10-CM

## 2022-06-05 ENCOUNTER — Ambulatory Visit (INDEPENDENT_AMBULATORY_CARE_PROVIDER_SITE_OTHER): Payer: 59

## 2022-06-05 DIAGNOSIS — J309 Allergic rhinitis, unspecified: Secondary | ICD-10-CM

## 2022-06-09 ENCOUNTER — Ambulatory Visit (INDEPENDENT_AMBULATORY_CARE_PROVIDER_SITE_OTHER): Payer: 59 | Admitting: *Deleted

## 2022-06-09 DIAGNOSIS — J309 Allergic rhinitis, unspecified: Secondary | ICD-10-CM

## 2022-06-16 ENCOUNTER — Other Ambulatory Visit (HOSPITAL_COMMUNITY): Payer: Self-pay

## 2022-06-16 ENCOUNTER — Ambulatory Visit (HOSPITAL_COMMUNITY)
Admission: EM | Admit: 2022-06-16 | Discharge: 2022-06-16 | Disposition: A | Discharge status: Home / Self Care | Payer: 59 | Attending: Family Medicine | Admitting: Family Medicine

## 2022-06-16 ENCOUNTER — Encounter (HOSPITAL_COMMUNITY): Payer: Self-pay | Admitting: Emergency Medicine

## 2022-06-16 DIAGNOSIS — H9201 Otalgia, right ear: Secondary | ICD-10-CM | POA: Diagnosis not present

## 2022-06-16 DIAGNOSIS — H6991 Unspecified Eustachian tube disorder, right ear: Secondary | ICD-10-CM | POA: Diagnosis not present

## 2022-06-16 MED ORDER — PREDNISONE 20 MG PO TABS
40.0000 mg | ORAL_TABLET | Freq: Every day | ORAL | 0 refills | Status: DC
  Filled 2022-06-16: qty 10, 5d supply, fill #0

## 2022-06-16 NOTE — ED Triage Notes (Signed)
Pt reports hears and feeling ear popping for a week. Hopes just has ear wax in ears but afraid something in her ears.

## 2022-06-18 ENCOUNTER — Other Ambulatory Visit (HOSPITAL_COMMUNITY): Payer: Self-pay

## 2022-06-18 ENCOUNTER — Other Ambulatory Visit (HOSPITAL_COMMUNITY): Payer: Self-pay | Admitting: Family Medicine

## 2022-06-18 MED ORDER — PREDNISONE 20 MG PO TABS
40.0000 mg | ORAL_TABLET | Freq: Every day | ORAL | 0 refills | Status: DC
  Filled 2022-06-18: qty 10, 5d supply, fill #0

## 2022-06-18 NOTE — ED Provider Notes (Signed)
  [facility]   [id number] 06/16/22 Arrival Time: 1237  ASSESSMENT & PLAN:  1. Acute otalgia, right   2. Eustachian tube dysfunction, right    No FB in EAC. With mild serous otitis; suspect ETD. Begin trial of: Meds ordered this encounter  Medications   predniSONE (DELTASONE) 20 MG tablet    Sig: Take 2 tablets (40 mg total) by mouth daily.    Dispense:  10 tablet    Refill:  0   May f/u with PCP or here as needed.  Reviewed expectations re: course of current medical issues. Questions answered. Outlined signs and symptoms indicating need for more acute intervention. Patient verbalized understanding. After Visit Summary given.   SUBJECTIVE: History from: patient.  Susan Boyle is a 32 y.o. female who presents with complaint of hearing and feeling ear popping for a week. Hopes just has ear wax in ears but afraid something in her ears.  No tx PTA. Denies specific ear pain.  Social History   Tobacco Use  Smoking Status Former   Types: Cigarettes  Smokeless Tobacco Never  Tobacco Comments   stopped in November 2021     OBJECTIVE:  Vitals:   06/16/22 1329  BP: 118/79  Pulse: 74  Resp: 14  Temp: 98.1 F (36.7 C)  TempSrc: Oral  SpO2: 99%     General appearance: alert; appears fatigued Ear Canal: normal TM: right: dull, bulging, serous fluid noted Neck: supple without LAD Lungs: unlabored respirations, symmetrical air entry; cough: absent; no respiratory distress Skin: warm and dry Psychological: alert and cooperative; normal mood and affect  No Known Allergies  Past Medical History:  Diagnosis Date   Anxiety    Asthma    Back pain, chronic    Depression    Knee pain, chronic    Family History  Adopted: Yes  Problem Relation Age of Onset   Heart disease Father 28       MI   Social History   Socioeconomic History   Marital status: Single    Spouse name: Not on file   Number of children: Not on file   Years of education: Not on  file   Highest education level: Not on file  Occupational History   Not on file  Tobacco Use   Smoking status: Former    Types: Cigarettes   Smokeless tobacco: Never   Tobacco comments:    stopped in November 2021   Substance and Sexual Activity   Alcohol use: Yes    Comment: social   Drug use: No   Sexual activity: Yes    Birth control/protection: I.U.D.  Other Topics Concern   Not on file  Social History Narrative   Not on file   Social Determinants of Health   Financial Resource Strain: Not on file  Food Insecurity: Not on file  Transportation Needs: Not on file  Physical Activity: Not on file  Stress: Not on file  Social Connections: Not on file  Intimate Partner Violence: Not on file             [name], MD 06/18/22 [phone]

## 2022-06-19 ENCOUNTER — Other Ambulatory Visit (HOSPITAL_COMMUNITY): Payer: Self-pay

## 2022-06-19 ENCOUNTER — Ambulatory Visit (INDEPENDENT_AMBULATORY_CARE_PROVIDER_SITE_OTHER): Payer: 59

## 2022-06-19 DIAGNOSIS — J309 Allergic rhinitis, unspecified: Secondary | ICD-10-CM | POA: Diagnosis not present

## 2022-06-22 ENCOUNTER — Telehealth: Payer: 59 | Admitting: Family

## 2022-06-22 DIAGNOSIS — H66009 Acute suppurative otitis media without spontaneous rupture of ear drum, unspecified ear: Secondary | ICD-10-CM | POA: Diagnosis not present

## 2022-06-22 MED ORDER — AMOXICILLIN-POT CLAVULANATE 875-125 MG PO TABS
1.0000 | ORAL_TABLET | Freq: Two times a day (BID) | ORAL | 0 refills | Status: DC
  Filled 2022-06-22 – 2022-06-23 (×2): qty 14, 7d supply, fill #0

## 2022-06-22 NOTE — Progress Notes (Signed)

## 2022-06-23 ENCOUNTER — Other Ambulatory Visit (HOSPITAL_COMMUNITY): Payer: Self-pay

## 2022-06-25 NOTE — Progress Notes (Signed)
REPLACED BROKEN VIAL [id number]

## 2022-07-01 ENCOUNTER — Other Ambulatory Visit (HOSPITAL_COMMUNITY): Payer: Self-pay

## 2022-07-04 ENCOUNTER — Ambulatory Visit (INDEPENDENT_AMBULATORY_CARE_PROVIDER_SITE_OTHER): Payer: 59 | Admitting: *Deleted

## 2022-07-04 DIAGNOSIS — J309 Allergic rhinitis, unspecified: Secondary | ICD-10-CM

## 2022-07-07 ENCOUNTER — Ambulatory Visit (INDEPENDENT_AMBULATORY_CARE_PROVIDER_SITE_OTHER): Payer: 59 | Admitting: *Deleted

## 2022-07-07 DIAGNOSIS — J309 Allergic rhinitis, unspecified: Secondary | ICD-10-CM | POA: Diagnosis not present

## 2022-07-10 ENCOUNTER — Other Ambulatory Visit (HOSPITAL_COMMUNITY): Payer: Self-pay

## 2022-07-11 ENCOUNTER — Other Ambulatory Visit (HOSPITAL_COMMUNITY): Payer: Self-pay

## 2022-07-11 MED ORDER — LISDEXAMFETAMINE DIMESYLATE 60 MG PO CAPS
60.0000 mg | ORAL_CAPSULE | Freq: Every morning | ORAL | 0 refills | Status: DC
  Filled 2022-07-11: qty 30, 30d supply, fill #0

## 2022-07-11 MED ORDER — ALBUTEROL SULFATE HFA 108 (90 BASE) MCG/ACT IN AERS
2.0000 | INHALATION_SPRAY | RESPIRATORY_TRACT | 0 refills | Status: DC | PRN
  Filled 2022-07-11: qty 18, 30d supply, fill #0

## 2022-07-14 ENCOUNTER — Other Ambulatory Visit (HOSPITAL_COMMUNITY): Payer: Self-pay

## 2022-07-15 ENCOUNTER — Other Ambulatory Visit (HOSPITAL_COMMUNITY): Payer: Self-pay

## 2022-07-18 ENCOUNTER — Other Ambulatory Visit (HOSPITAL_COMMUNITY): Payer: Self-pay

## 2022-07-23 ENCOUNTER — Ambulatory Visit (INDEPENDENT_AMBULATORY_CARE_PROVIDER_SITE_OTHER): Payer: 59 | Admitting: *Deleted

## 2022-07-23 DIAGNOSIS — J309 Allergic rhinitis, unspecified: Secondary | ICD-10-CM

## 2022-07-31 ENCOUNTER — Other Ambulatory Visit (HOSPITAL_COMMUNITY): Payer: Self-pay

## 2022-07-31 ENCOUNTER — Ambulatory Visit (INDEPENDENT_AMBULATORY_CARE_PROVIDER_SITE_OTHER): Payer: 59

## 2022-07-31 DIAGNOSIS — J309 Allergic rhinitis, unspecified: Secondary | ICD-10-CM

## 2022-08-04 ENCOUNTER — Ambulatory Visit (INDEPENDENT_AMBULATORY_CARE_PROVIDER_SITE_OTHER): Payer: 59

## 2022-08-04 DIAGNOSIS — J309 Allergic rhinitis, unspecified: Secondary | ICD-10-CM | POA: Diagnosis not present

## 2022-08-14 ENCOUNTER — Ambulatory Visit (INDEPENDENT_AMBULATORY_CARE_PROVIDER_SITE_OTHER): Payer: 59

## 2022-08-14 DIAGNOSIS — J309 Allergic rhinitis, unspecified: Secondary | ICD-10-CM

## 2022-09-02 ENCOUNTER — Other Ambulatory Visit: Payer: Self-pay | Admitting: Family Medicine

## 2022-09-02 ENCOUNTER — Ambulatory Visit: Payer: Self-pay

## 2022-09-02 DIAGNOSIS — S63501A Unspecified sprain of right wrist, initial encounter: Secondary | ICD-10-CM

## 2022-09-11 ENCOUNTER — Ambulatory Visit (INDEPENDENT_AMBULATORY_CARE_PROVIDER_SITE_OTHER): Payer: 59

## 2022-09-11 DIAGNOSIS — J309 Allergic rhinitis, unspecified: Secondary | ICD-10-CM | POA: Diagnosis not present

## 2022-09-16 ENCOUNTER — Ambulatory Visit: Payer: 59 | Admitting: Family Medicine

## 2022-09-17 ENCOUNTER — Encounter: Payer: Self-pay | Admitting: Family Medicine

## 2022-09-17 ENCOUNTER — Ambulatory Visit (INDEPENDENT_AMBULATORY_CARE_PROVIDER_SITE_OTHER): Payer: 59 | Admitting: Family Medicine

## 2022-09-17 VITALS — BP 107/64 | HR 78 | Temp 98.1°F | Resp 18 | Ht 62.0 in | Wt 178.5 lb

## 2022-09-17 DIAGNOSIS — J4520 Mild intermittent asthma, uncomplicated: Secondary | ICD-10-CM

## 2022-09-17 DIAGNOSIS — R7302 Impaired glucose tolerance (oral): Secondary | ICD-10-CM | POA: Diagnosis not present

## 2022-09-17 DIAGNOSIS — Z7689 Persons encountering health services in other specified circumstances: Secondary | ICD-10-CM

## 2022-09-17 DIAGNOSIS — Z0000 Encounter for general adult medical examination without abnormal findings: Secondary | ICD-10-CM

## 2022-09-17 DIAGNOSIS — Z975 Presence of (intrauterine) contraceptive device: Secondary | ICD-10-CM

## 2022-09-17 DIAGNOSIS — Z124 Encounter for screening for malignant neoplasm of cervix: Secondary | ICD-10-CM

## 2022-09-17 DIAGNOSIS — Z13220 Encounter for screening for lipoid disorders: Secondary | ICD-10-CM | POA: Diagnosis not present

## 2022-09-17 DIAGNOSIS — F909 Attention-deficit hyperactivity disorder, unspecified type: Secondary | ICD-10-CM | POA: Diagnosis not present

## 2022-09-17 DIAGNOSIS — Z136 Encounter for screening for cardiovascular disorders: Secondary | ICD-10-CM

## 2022-09-17 NOTE — Progress Notes (Signed)
Complete physical exam and establish care  Patient: Susan Boyle   DOB: 06/19/1990   31 y.o. Female  MRN: [id number]  Subjective:    Chief Complaint  Patient presents with   Establish Care    Patient is here to establish care with a new PCP, she is also here for her annual physical and pap   Annual Exam    Susan Boyle is a 32 y.o. female who presents today for a complete physical exam. She reports consuming a general diet.  Walking and goes to [facility]  She generally feels fatigue. She reports sleeping well. She does not have additional problems to discuss today.  Last pap was 3 years ago with [facility]. She is seeing Dr Evelene Croon at [facility] that she's been established with for a while. She was initially prescribed Paxil and Vyvanse for PTSD and ADHD. She is only using Vyvanse now.  She is also using Trazodone prn for insomnia.  Pt has asthma. Using albuterol prn, Advair, Xyzal and has epipen. Seeing allergist.  Time for pap smear. Pt reports having IUD. No strings visualized. Due out next year and reports some abdominal cramping. Flowsheet Row Office Visit from 09/17/2022 in [facility]  PHQ-9 Total Score 14          09/17/2022    2:24 PM  GAD 7 : Generalized Anxiety Score  Nervous, Anxious, on Edge 2  Control/stop worrying 2  Worry too much - different things 2  Trouble relaxing 2  Restless 1  Easily annoyed or irritable 1  Afraid - awful might happen 1  Total GAD 7 Score 11  Anxiety Difficulty Very difficult     Most recent fall risk assessment:    09/17/2022    2:23 PM  Fall Risk   Falls in the past year? 0  Number falls in past yr: 0  Injury with Fall? 0  Risk for fall due to : No Fall Risks  Follow up Falls evaluation completed     Most recent depression screenings:    09/17/2022    2:23 PM 05/02/2020    3:25 PM  PHQ 2/9 Scores  PHQ - 2 Score 3 0  PHQ- 9 Score 14     Vision:Not within last year  and Dental: No current  dental problems  Patient Active Problem List   Diagnosis Date Noted   Attention deficit hyperactivity disorder (ADHD) 09/17/2022   IUD contraception 09/17/2022   Acute pain of right shoulder 03/22/2021   Seasonal and perennial allergic rhinoconjunctivitis 11/05/2020   Allergic reaction 07/04/2020   Food allergy 05/02/2020   Environmental allergies 05/02/2020   Dermatographism 05/02/2020   Asthma    Lateral epicondylitis of right elbow 09/26/2015   Subacromial bursitis 08/15/2015   Past Medical History:  Diagnosis Date   Allergy    Seasonal. Taking allergy injections   Anxiety    Asthma    Back pain, chronic    Depression    Knee pain, chronic    Past Surgical History:  Procedure Laterality Date   HERNIA REPAIR     inguinal hernia repair as infant   NERVE, TENDON AND ARTERY REPAIR Left 07/20/2013   Procedure: NERVE,  REPAIR;  Surgeon: Nicki Reaper, MD;  Location: [facility];  Service: Orthopedics;  Laterality: Left;   thumb surgery Left    Nerve repair   Social History   Tobacco Use   Smoking status: Every Day  Current packs/day: 0.00    Average packs/day: 0.3 packs/day for 10.0 years (2.5 ttl pk-yrs)    Types: Cigarettes, E-cigarettes    Last attempt to quit: 01/25/2019    Years since quitting: 3.6   Smokeless tobacco: Never   Tobacco comments:    stopped in November 2021   Vaping Use   Vaping status: Every Day   Substances: Nicotine  Substance Use Topics   Alcohol use: Yes    Alcohol/week: 5.0 standard drinks of alcohol    Types: 5 Glasses of wine per week    Comment: social   Drug use: No   Social History   Socioeconomic History   Marital status: Married    Spouse name: Not on file   Number of children: 0   Years of education: Not on file   Highest education level: Not on file  Occupational History   Not on file  Tobacco Use   Smoking status: Every Day    Current packs/day: 0.00    Average packs/day: 0.3 packs/day for 10.0 years  (2.5 ttl pk-yrs)    Types: Cigarettes, E-cigarettes    Last attempt to quit: 01/25/2019    Years since quitting: 3.6   Smokeless tobacco: Never   Tobacco comments:    stopped in November 2021   Vaping Use   Vaping status: Every Day   Substances: Nicotine  Substance and Sexual Activity   Alcohol use: Yes    Alcohol/week: 5.0 standard drinks of alcohol    Types: 5 Glasses of wine per week    Comment: social   Drug use: No   Sexual activity: Yes    Birth control/protection: I.U.D.  Other Topics Concern   Not on file  Social History Narrative   Not on file   Social Determinants of Health   Financial Resource Strain: Not on file  Food Insecurity: Not on file  Transportation Needs: Not on file  Physical Activity: Not on file  Stress: Not on file  Social Connections: Unknown (06/25/2021)   [facility]   Social Network    Social Network: Not on file  Intimate Partner Violence: Unknown (05/27/2021)   Received from [facility]   HITS    Physically Hurt: Not on file    Insult or Talk Down To: Not on file    Threaten Physical Harm: Not on file    Scream or Curse: Not on file   Family History  Adopted: Yes  Problem Relation Age of Onset   Heart disease Father 62       MI   Cancer Maternal Grandfather    Vision loss Maternal Grandmother    Vision loss Maternal Uncle       Patient Care Team: Suzan Slick, MD as PCP - General (Family Medicine) Monica Becton, MD as Consulting Physician (Sports Medicine)   Outpatient Medications Prior to Visit  Medication Sig   albuterol (VENTOLIN HFA) 108 (90 Base) MCG/ACT inhaler Inhale 2 puffs into the lungs every 4 (four) hours as needed for wheezing or shortness of breath (coughing fits)   EPINEPHrine 0.3 mg/0.3 mL IJ SOAJ injection Inject 0.3 mg into the muscle as needed for anaphylaxis.   fluticasone-salmeterol (ADVAIR DISKUS) 250-50 MCG/ACT AEPB Inhale 1 puff into the lungs in the morning and at bedtime.  Rinse mouth after each use.   levocetirizine (XYZAL) 5 MG tablet Take 1 tablet (5 mg total) by mouth every evening.   levonorgestrel (MIRENA) 20 MCG/24HR IUD 1 each  by Intrauterine route once.   lisdexamfetamine (VYVANSE) 60 MG capsule Take 1 capsule (60 mg total) by mouth in the morning.   traZODone (DESYREL) 50 MG tablet Take 1 to 3 tablets by mouth at bedtime (Patient taking differently: Take 50-150 mg by mouth at bedtime.)   [DISCONTINUED] amoxicillin-clavulanate (AUGMENTIN) 875-125 MG tablet Take 1 tablet by mouth 2 (two) times daily.   [DISCONTINUED] Amphetamine ER (DYANAVEL XR) 20 MG CHER Take 1 tablet by mouth every morning.   [DISCONTINUED] fluticasone (FLONASE) 50 MCG/ACT nasal spray Place 2 sprays into both nostrils daily. (Patient not taking: Reported on 06/06/2021)   [DISCONTINUED] fluticasone furoate-vilanterol (BREO ELLIPTA) 100-25 MCG/ACT AEPB Inhale 1 puff into the lungs daily. (Patient not taking: Reported on 09/17/2022)   [DISCONTINUED] ibuprofen (ADVIL) 600 MG tablet Take 1 tablet (600 mg total) by mouth every 6 (six) hours.   [DISCONTINUED] lidocaine (LIDODERM) 5 % Place 1 patch onto the skin daily. Remove & Discard patch within 12 hours or as directed by MD   [DISCONTINUED] lisdexamfetamine (VYVANSE) 60 MG capsule Take 1 capsule (60 mg total) by mouth in the morning.   [DISCONTINUED] lisdexamfetamine (VYVANSE) 60 MG capsule Take 1 capsule (60 mg total) by mouth in the morning.   [DISCONTINUED] lisdexamfetamine (VYVANSE) 60 MG capsule Take 1 capsule (60 mg total) by mouth daily.   [DISCONTINUED] lisdexamfetamine (VYVANSE) 60 MG capsule Take 1 capsule (60 mg total) by mouth in the morning.   [DISCONTINUED] Lisdexamfetamine Dimesylate (VYVANSE) 60 MG CHEW Chew 1 tablet by mouth every morning.   [DISCONTINUED] ondansetron (ZOFRAN) 4 MG tablet Take 1 tablet (4 mg total) by mouth every 6 (six) hours. (Patient not taking: Reported on 09/17/2022)   [DISCONTINUED] PARoxetine (PAXIL) 20 MG  tablet Take 1 tablet by mouth at bedtime (Patient not taking: Reported on 09/17/2022)   [DISCONTINUED] PARoxetine (PAXIL) 20 MG tablet Take 1 tablet (20 mg total) by mouth at bedtime.   [DISCONTINUED] PARoxetine (PAXIL) 20 MG tablet Take 1 tablet (20 mg total) by mouth at bedtime.   [DISCONTINUED] predniSONE (DELTASONE) 20 MG tablet Take 2 tablets (40 mg total) by mouth daily.   [DISCONTINUED] traZODone (DESYREL) 50 MG tablet Take 1-3 tablets (50-150 mg total) by mouth at bedtime as needed.   [DISCONTINUED] traZODone (DESYREL) 50 MG tablet Take 1-3 tablets (50-150 mg total) by mouth at bedtime as needed.   [DISCONTINUED] traZODone (DESYREL) 50 MG tablet Take 1-3 tablets (50-150 mg total) by mouth at bedtime as needed.   [DISCONTINUED] triazolam (HALCION) 0.25 MG tablet Take 1 tablet (0.25 mg total) by mouth once for 1 dose. Take 1 hour prior to oral surgery with a sip of water   [DISCONTINUED] trimethoprim-polymyxin b (POLYTRIM) ophthalmic solution Place 1 drop into the right eye every 4 (four) hours. X 5 days   No facility-administered medications prior to visit.    Review of Systems  All other systems reviewed and are negative.         Objective:     BP 107/64   Pulse 78   Temp 98.1 F (36.7 C) (Oral)   Resp 18   Ht 5\' 2"  (1.575 m)   Wt 178 lb 8 oz (81 kg)   LMP  (LMP Unknown) Comment: Patient does not get periods due to IUD  SpO2 97%   BMI 32.65 kg/m    Physical Exam Vitals and nursing note reviewed. Exam conducted with a chaperone present.  Constitutional:      Appearance: Normal appearance. She is normal weight.  HENT:     Head: Normocephalic and atraumatic.     Right Ear: Tympanic membrane, ear canal and external ear normal.     Left Ear: Tympanic membrane, ear canal and external ear normal.     Nose: Nose normal.     Mouth/Throat:     Mouth: Mucous membranes are moist.  Eyes:     Conjunctiva/sclera: Conjunctivae normal.     Pupils: Pupils are equal, round, and  reactive to light.  Cardiovascular:     Rate and Rhythm: Normal rate and regular rhythm.  Pulmonary:     Breath sounds: Normal breath sounds.  Abdominal:     General: Abdomen is flat.  Genitourinary:    General: Normal vulva.  Musculoskeletal:        General: Normal range of motion.  Skin:    General: Skin is warm.     Capillary Refill: Capillary refill takes less than 2 seconds.  Neurological:     General: No focal deficit present.     Mental Status: She is alert and oriented to person, place, and time. Mental status is at baseline.  Psychiatric:        Mood and Affect: Mood normal.        Behavior: Behavior normal.        Thought Content: Thought content normal.        Judgment: Judgment normal.      No results found for any visits on 09/17/22.     Assessment & Plan:    Routine Health Maintenance and Physical Exam  Immunization History  Administered Date(s) Administered   Influenza-Unspecified 01/06/2020, 03/08/2021   Janssen (J&J) SARS-COV-2 Vaccination 05/07/2019   Tdap 03/05/2021    Health Maintenance  Topic Date Due   Hepatitis C Screening  Never done   PAP SMEAR-Modifier  Never done   COVID-19 Vaccine (2 - 2023-24 season) 10/25/2021   INFLUENZA VACCINE  09/25/2022   DTaP/Tdap/Td (2 - Td or Tdap) 03/06/2031   HIV Screening  Completed   HPV VACCINES  Aged Out    Discussed health benefits of physical activity, and encouraged her to engage in regular exercise appropriate for her age and condition.  Problem List Items Addressed This Visit       Respiratory   Asthma     Other   Attention deficit hyperactivity disorder (ADHD)   IUD contraception   Other Visit Diagnoses     Annual physical exam    -  Primary   Encounter to establish care with new doctor       Encounter for lipid screening for cardiovascular disease       Relevant Orders   Lipid panel   Impaired glucose tolerance       Relevant Orders   CBC with Differential/Platelet    Comprehensive metabolic panel   Hemoglobin A1c   Screening for cervical cancer       Relevant Orders   IGP,CtNgTv,Apt HPV      Return in about 1 year (around 09/17/2023) for Annual Physical or sooner prn. Annual physical exam  Encounter to establish care with new doctor  Encounter for lipid screening for cardiovascular disease -     Lipid panel  Impaired glucose tolerance -     CBC with Differential/Platelet -     Comprehensive metabolic panel -     Hemoglobin A1c  Screening for cervical cancer -     IGP,CtNgTv,Apt HPV  IUD contraception  Mild intermittent asthma without complication  Attention deficit  hyperactivity disorder (ADHD), unspecified ADHD type   Screening labs and pap for CPE today To follow up with gynecology on IUD To continue follow up with allergist and Mental health provider as scheduled.     Suzan Slick, MD

## 2022-09-18 LAB — COMPREHENSIVE METABOLIC PANEL
AST: 17 IU/L (ref 0–40)
Albumin: 4.6 g/dL (ref 3.9–4.9)
Alkaline Phosphatase: 47 IU/L (ref 44–121)
Bilirubin Total: 0.4 mg/dL (ref 0.0–1.2)
CO2: 23 mmol/L (ref 20–29)
Calcium: 9.5 mg/dL (ref 8.7–10.2)
Chloride: 103 mmol/L (ref 96–106)
Creatinine, Ser: 0.67 mg/dL (ref 0.57–1.00)
Globulin, Total: 2.2 g/dL (ref 1.5–4.5)
Glucose: 84 mg/dL (ref 70–99)
Potassium: 4.2 mmol/L (ref 3.5–5.2)
Sodium: 140 mmol/L (ref 134–144)
eGFR: 119 mL/min/{1.73_m2} (ref >59)

## 2022-09-18 LAB — LIPID PANEL
Cholesterol, Total: 196 mg/dL (ref 100–199)
HDL: 61 mg/dL (ref >39)
LDL Chol Calc (NIH): 106 mg/dL — ABNORMAL HIGH (ref 0–99)
Triglycerides: 167 mg/dL — ABNORMAL HIGH (ref 0–149)

## 2022-09-18 LAB — CBC WITH DIFFERENTIAL/PLATELET
Basophils Absolute: 0 10*3/uL (ref 0.0–0.2)
Basos: 0 %
EOS (ABSOLUTE): 0.1 10*3/uL (ref 0.0–0.4)
Hematocrit: 40.1 % (ref 34.0–46.6)
Hemoglobin: 13.5 g/dL (ref 11.1–15.9)
Immature Grans (Abs): 0 10*3/uL (ref 0.0–0.1)
Immature Granulocytes: 0 %
Lymphocytes Absolute: 2 10*3/uL (ref 0.7–3.1)
Lymphs: 34 %
MCH: 30.4 pg (ref 26.6–33.0)
Monocytes Absolute: 0.3 10*3/uL (ref 0.1–0.9)
Monocytes: 5 %
Neutrophils Absolute: 3.5 10*3/uL (ref 1.4–7.0)
Neutrophils: 59 %
Platelets: 246 10*3/uL (ref 150–450)
RBC: 4.44 x10E6/uL (ref 3.77–5.28)
RDW: 12.9 % (ref 11.7–15.4)
WBC: 6 10*3/uL (ref 3.4–10.8)

## 2022-09-18 LAB — HEMOGLOBIN A1C: Hgb A1c MFr Bld: 5.2 % (ref 4.8–5.6)

## 2022-09-25 ENCOUNTER — Ambulatory Visit (INDEPENDENT_AMBULATORY_CARE_PROVIDER_SITE_OTHER): Payer: 59

## 2022-09-25 DIAGNOSIS — J309 Allergic rhinitis, unspecified: Secondary | ICD-10-CM | POA: Diagnosis not present

## 2022-09-30 ENCOUNTER — Ambulatory Visit (INDEPENDENT_AMBULATORY_CARE_PROVIDER_SITE_OTHER): Payer: 59

## 2022-09-30 DIAGNOSIS — J309 Allergic rhinitis, unspecified: Secondary | ICD-10-CM | POA: Diagnosis not present

## 2022-10-01 ENCOUNTER — Other Ambulatory Visit (HOSPITAL_COMMUNITY): Payer: Self-pay

## 2022-10-01 DIAGNOSIS — F3342 Major depressive disorder, recurrent, in full remission: Secondary | ICD-10-CM | POA: Diagnosis not present

## 2022-10-01 DIAGNOSIS — F900 Attention-deficit hyperactivity disorder, predominantly inattentive type: Secondary | ICD-10-CM | POA: Diagnosis not present

## 2022-10-01 DIAGNOSIS — F411 Generalized anxiety disorder: Secondary | ICD-10-CM | POA: Diagnosis not present

## 2022-10-01 DIAGNOSIS — F4322 Adjustment disorder with anxiety: Secondary | ICD-10-CM | POA: Diagnosis not present

## 2022-10-01 MED ORDER — TRAZODONE HCL 50 MG PO TABS
50.0000 mg | ORAL_TABLET | Freq: Every evening | ORAL | 3 refills | Status: DC | PRN
  Filled 2022-10-01: qty 270, 90d supply, fill #0

## 2022-10-01 MED ORDER — LISDEXAMFETAMINE DIMESYLATE 60 MG PO CAPS
60.0000 mg | ORAL_CAPSULE | Freq: Every morning | ORAL | 0 refills | Status: DC
  Filled 2022-10-01: qty 30, 30d supply, fill #0

## 2022-10-01 MED ORDER — BUPROPION HCL ER (XL) 150 MG PO TB24
150.0000 mg | ORAL_TABLET | Freq: Every day | ORAL | 3 refills | Status: DC
  Filled 2022-10-01: qty 90, 90d supply, fill #0
  Filled 2023-01-06: qty 90, 90d supply, fill #1
  Filled 2023-02-17: qty 30, 30d supply, fill #2
  Filled 2023-02-17: qty 90, 90d supply, fill #2
  Filled 2023-04-30 – 2023-05-01 (×2): qty 30, 30d supply, fill #0
  Filled 2023-05-31: qty 30, 30d supply, fill #1
  Filled 2023-06-26: qty 30, 30d supply, fill #2
  Filled 2023-09-02: qty 30, 30d supply, fill #3

## 2022-10-01 MED ORDER — PAROXETINE HCL 20 MG PO TABS
20.0000 mg | ORAL_TABLET | Freq: Every day | ORAL | 3 refills | Status: AC
  Filled 2022-10-01: qty 90, 90d supply, fill #0
  Filled 2023-01-06: qty 90, 90d supply, fill #1
  Filled 2023-04-30: qty 90, 90d supply, fill #2
  Filled 2023-04-30: qty 90, 90d supply, fill #0

## 2022-10-10 ENCOUNTER — Ambulatory Visit (INDEPENDENT_AMBULATORY_CARE_PROVIDER_SITE_OTHER): Payer: 59

## 2022-10-10 DIAGNOSIS — J309 Allergic rhinitis, unspecified: Secondary | ICD-10-CM

## 2022-10-13 ENCOUNTER — Telehealth: Payer: 59 | Admitting: Physician Assistant

## 2022-10-13 ENCOUNTER — Other Ambulatory Visit (HOSPITAL_COMMUNITY): Payer: Self-pay

## 2022-10-13 DIAGNOSIS — T887XXA Unspecified adverse effect of drug or medicament, initial encounter: Secondary | ICD-10-CM

## 2022-10-13 DIAGNOSIS — R110 Nausea: Secondary | ICD-10-CM

## 2022-10-13 MED ORDER — ONDANSETRON 4 MG PO TBDP
4.0000 mg | ORAL_TABLET | Freq: Three times a day (TID) | ORAL | 0 refills | Status: DC | PRN
  Filled 2022-10-13: qty 20, 7d supply, fill #0

## 2022-10-13 NOTE — Progress Notes (Signed)

## 2022-10-16 ENCOUNTER — Ambulatory Visit (INDEPENDENT_AMBULATORY_CARE_PROVIDER_SITE_OTHER): Payer: 59 | Admitting: Family Medicine

## 2022-10-16 VITALS — BP 116/69 | HR 74 | Temp 98.0°F | Resp 18 | Ht 62.0 in | Wt 178.7 lb

## 2022-10-16 DIAGNOSIS — M79601 Pain in right arm: Secondary | ICD-10-CM | POA: Diagnosis not present

## 2022-10-16 NOTE — Progress Notes (Signed)
Established Patient Office Visit  Subjective   Patient ID: Susan Boyle, female    DOB: 03/07/1990  Age: 32 y.o. MRN: [id number]  Chief Complaint  Patient presents with   Shoulder Pain    Patient states that she has been dealing with a right wrist sprain for about 3 months, she states that due her using her left now more than her right that her left shoulder and lower back is hurting , she would like a workplace accomodation form filled out and a referral to physical therapy    Shoulder Pain   Right Forearm pain Pt is a Physiological scientist. Pt reports she was at work one day while performing a task at work about 3 months ago. She went to [facility]. She was sent for xrays and was negative. Diagnosed with a sprain. She was given a splint to wear. She followed back up in a week and was told to remove the splint and she was advised that she could return to work. She reports she went back to work while trying to hold a scanner and was unable to hold an object. She reports this was under [employer] comp but was denied 2 weeks ago. She reports she never got to see Ortho. She reports since then, she's been doing everything with her left hand. She reports she can't even text with her right hand. She reports the pain is at the wrist that feels like shooting and burning pain from her wrist to her elbow. She reports she hasn't been taking any NSAIDs or oral medications for the pain. Pt has brought a form for accommodation for work.   Review of Systems  Musculoskeletal:  Positive for joint pain.       Right forearm pain  All other systems reviewed and are negative.    Objective:     BP 116/69   Pulse 74   Temp 98 F (36.7 C) (Oral)   Resp 18   Ht 5\' 2"  (1.575 m)   Wt 178 lb 11.2 oz (81.1 kg)   LMP  (LMP Unknown) Comment: Patient does not get periods due to IUD  SpO2 98%   BMI 32.68 kg/m    Physical Exam Vitals and nursing note reviewed.  Constitutional:       Appearance: Normal appearance. She is normal weight.  HENT:     Head: Normocephalic and atraumatic.     Right Ear: Tympanic membrane and external ear normal.     Left Ear: External ear normal.     Nose: Nose normal.     Mouth/Throat:     Mouth: Mucous membranes are moist.     Pharynx: Oropharynx is clear.  Eyes:     Conjunctiva/sclera: Conjunctivae normal.     Pupils: Pupils are equal, round, and reactive to light.  Cardiovascular:     Rate and Rhythm: Normal rate.  Pulmonary:     Effort: Pulmonary effort is normal.  Musculoskeletal:        General: Tenderness present. Normal range of motion.     Comments: Pt holding right forearm slightly abducted to her side. Strength 5/5 bilateral upper extremity TTP to wrist and flexor carpi radialis  Skin:    General: Skin is warm.     Capillary Refill: Capillary refill takes less than 2 seconds.  Neurological:     General: No focal deficit present.     Mental Status: She is alert and oriented to person, place, and time. Mental  status is at baseline.  Psychiatric:        Mood and Affect: Mood normal.        Behavior: Behavior normal.        Thought Content: Thought content normal.        Judgment: Judgment normal.    No results found for any visits on 10/16/22.    The ASCVD Risk score (Arnett DK, et al., 2019) failed to calculate for the following reasons:   The 2019 ASCVD risk score is only valid for ages 8 to 34    Assessment & Plan:   Problem List Items Addressed This Visit   None  Pain of right upper extremity -     Ambulatory referral to Orthopedic Surgery   Pt with 3 month hx of right forearm pain of unknown etiology Suspect flexor carpi radialis tendinitis but pt reports she's been using her left arm so with rest, would've expected this to be healed? Pt does report she hasn't been consistent with nsaid usage, advised to take Aleve or Advil prn along with wrist splint, icing, and may use voltaren gel topically 4 times a  day. Refer to Ortho for further evaluation and treatment options. Pt in agreement with evaluation and plan. I've advised her I'm unable to complete work form until we have formal diagnosis. No follow-ups on file.    Suzan Slick, MD

## 2022-10-17 ENCOUNTER — Ambulatory Visit (INDEPENDENT_AMBULATORY_CARE_PROVIDER_SITE_OTHER): Payer: 59 | Admitting: *Deleted

## 2022-10-17 DIAGNOSIS — J309 Allergic rhinitis, unspecified: Secondary | ICD-10-CM
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
## 2022-11-03 DIAGNOSIS — Z113 Encounter for screening for infections with a predominantly sexual mode of transmission: Secondary | ICD-10-CM | POA: Diagnosis not present

## 2022-11-03 DIAGNOSIS — N9419 Other specified dyspareunia: Secondary | ICD-10-CM | POA: Diagnosis not present
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
## 2022-11-12 ENCOUNTER — Ambulatory Visit (INDEPENDENT_AMBULATORY_CARE_PROVIDER_SITE_OTHER): Payer: 59 | Admitting: Orthopaedic Surgery

## 2022-11-12 ENCOUNTER — Ambulatory Visit (HOSPITAL_BASED_OUTPATIENT_CLINIC_OR_DEPARTMENT_OTHER): Payer: 59

## 2022-11-12 DIAGNOSIS — M79631 Pain in right forearm: Secondary | ICD-10-CM

## 2022-11-12 DIAGNOSIS — G5630 Lesion of radial nerve, unspecified upper limb: Secondary | ICD-10-CM

## 2022-11-12 NOTE — Progress Notes (Signed)
Chief Complaint: Right forearm pain     History of Present Illness:    Susan Boyle is a 32 y.o. 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  She states that she did not have an injury while at work several months prior where she was pushing down on bags and subsequently has felt a burning persistent pain with weakness with wrist extension of the forearm on the right.  Denies any previous injuries.  She has not undergone any therapy for this.  She is here today for further discussion    Surgical History:   None  PMH/PSH/Family History/Social History/Meds/Allergies:    Past Medical History:  Diagnosis Date   Allergy    Seasonal. Taking allergy injections   Anxiety    Asthma    Back pain, chronic    Depression    Knee pain, chronic    Past Surgical History:  Procedure Laterality Date   HERNIA REPAIR     inguinal hernia repair as infant   NERVE, TENDON AND ARTERY REPAIR Left 07/20/2013   Procedure: NERVE,  REPAIR;  Surgeon: Nicki Reaper, MD;  Location: [facility];  Service: Orthopedics;  Laterality: Left;   thumb surgery Left    Nerve repair   Social History   Socioeconomic History   Marital status: Married    Spouse name: Not on file   Number of children: 0   Years of education: Not on file   Highest education level: Some college, no degree  Occupational History   Not on file  Tobacco Use   Smoking status: Every Day    Current packs/day: 0.00    Average packs/day: 0.3 packs/day for 10.0 years (2.5 ttl pk-yrs)    Types: Cigarettes, E-cigarettes    Last attempt to quit: 01/25/2019    Years since quitting: 3.8   Smokeless tobacco: Never   Tobacco comments:    stopped in November 2021   Vaping Use   Vaping status: Every Day   Substances: Nicotine  Substance and Sexual Activity   Alcohol use: Yes    Alcohol/week: 5.0 standard drinks of alcohol     Types: 5 Glasses of wine per week    Comment: social   Drug use: No   Sexual activity: Yes    Birth control/protection: I.U.D.  Other Topics Concern   Not on file  Social History Narrative   Not on file   Social Determinants of Health   Financial Resource Strain: Medium Risk (10/16/2022)   Overall Financial Resource Strain (CARDIA)    Difficulty of Paying Living Expenses: Somewhat hard  Food Insecurity: No Food Insecurity (10/16/2022)   Hunger Vital Sign    Worried About Running Out of Food in the Last Year: Never true    Ran Out of Food in the Last Year: Never true  Transportation Needs: No Transportation Needs (10/16/2022)   PRAPARE - Administrator, Civil Service (Medical): No    Lack of Transportation (Non-Medical): No  Physical Activity: Insufficiently Active (10/16/2022)   Exercise Vital Sign    Days of Exercise per Week: 1 day    Minutes of Exercise per Session: 30 min  Stress: Stress Concern Present (10/16/2022)   [employer] of Occupational Health - Occupational Stress Questionnaire  Feeling of Stress : Very much  Social Connections: Socially Isolated (10/16/2022)   Social Connection and Isolation Panel [NHANES]    Frequency of Communication with Friends and Family: Once a week    Frequency of Social Gatherings with Friends and Family: Once a week    Attends Religious Services: Never    Database administrator or Organizations: No    Attends Engineer, structural: Not on file    Marital Status: Living with partner   Family History  Adopted: Yes  Problem Relation Age of Onset   Heart disease Father 8       MI   Cancer Maternal Grandfather    Vision loss Maternal Grandmother    Vision loss Maternal Uncle    Allergies  Allergen Reactions   Sulfamethoxazole Nausea And Vomiting   Current Outpatient Medications  Medication Sig Dispense Refill   albuterol (VENTOLIN HFA) 108 (90 Base) MCG/ACT inhaler Inhale 2 puffs into the lungs every 4  (four) hours as needed for wheezing or shortness of breath (coughing fits) 18 g 0   buPROPion (WELLBUTRIN XL) 150 MG 24 hr tablet Take 1 tablet (150 mg total) by mouth at bedtime. 90 tablet 3   EPINEPHrine 0.3 mg/0.3 mL IJ SOAJ injection Inject 0.3 mg into the muscle as needed for anaphylaxis. 2 each 2   fluticasone-salmeterol (ADVAIR DISKUS) 250-50 MCG/ACT AEPB Inhale 1 puff into the lungs in the morning and at bedtime. Rinse mouth after each use. 60 each 5   levocetirizine (XYZAL) 5 MG tablet Take 1 tablet (5 mg total) by mouth every evening. 30 tablet 2   levonorgestrel (MIRENA) 20 MCG/24HR IUD 1 each by Intrauterine route once.     lisdexamfetamine (VYVANSE) 60 MG capsule Take 1 capsule (60 mg total) by mouth in the morning. 30 capsule 0   lisdexamfetamine (VYVANSE) 60 MG capsule Take 1 capsule (60 mg total) by mouth in the morning. 30 capsule 0   ondansetron (ZOFRAN-ODT) 4 MG disintegrating tablet Dissolve 1 tablet (4 mg total) in mouth every 8 (eight) hours as needed. 20 tablet 0   PARoxetine (PAXIL) 20 MG tablet Take 1 tablet (20 mg total) by mouth at bedtime. 90 tablet 3   traZODone (DESYREL) 50 MG tablet Take 1-3 tablets (50-150 mg total) by mouth at bedtime as needed. 270 tablet 3   No current facility-administered medications for this visit.   No results found.  Review of Systems:   A ROS was performed including pertinent positives and negatives as documented in the HPI.  Physical Exam :   Constitutional: NAD and appears stated age Neurological: Alert and oriented Psych: Appropriate affect and cooperative There were no vitals taken for this visit.   Comprehensive Musculoskeletal Exam:    Tenderness about the mobile wad of the right lateral forearm.  There is pain with resisted extension.  There is a burning distribution type pain in the course of the posterior interosseous nerve  Imaging:   Xray (2 views right forearm x-ray): Normal   I personally reviewed and interpreted  the radiographs.   Assessment:   32 y.o. female right-hand-dominant female presents with what appears to be radial tunnel type syndrome.  Given this I would like her to begin occupational therapy and I would also like to place her referral to Dr. Madelyn Brunner to see if she would be a candidate for radial nerve neurolysis.  I will plan to see her back in 6 weeks for reassessment  Plan :    -  Return to clinic 6 weeks for reassessment     I personally saw and evaluated the patient, and participated in the management and treatment plan.  Huel Cote, MD Attending Physician, Orthopedic Surgery  This document was dictated using Dragon voice recognition software. A reasonable attempt at proof reading has been made to minimize errors.

## 2022-11-13 DIAGNOSIS — F3342 Major depressive disorder, recurrent, in full remission: Secondary | ICD-10-CM | POA: Diagnosis not present

## 2022-11-13 DIAGNOSIS — F900 Attention-deficit hyperactivity disorder, predominantly inattentive type: Secondary | ICD-10-CM | POA: Diagnosis not present

## 2022-11-13 DIAGNOSIS — F4310 Post-traumatic stress disorder, unspecified: Secondary | ICD-10-CM | POA: Diagnosis not present

## 2022-11-14 ENCOUNTER — Other Ambulatory Visit (HOSPITAL_COMMUNITY): Payer: Self-pay

## 2022-11-14 MED ORDER — LISDEXAMFETAMINE DIMESYLATE 60 MG PO CAPS
60.0000 mg | ORAL_CAPSULE | Freq: Every morning | ORAL | 0 refills | Status: DC
Start: 2022-11-14 — End: 2023-05-19
  Filled 2022-11-14: qty 90, 90d supply, fill #0

## 2022-11-17 ENCOUNTER — Encounter: Payer: Self-pay | Admitting: Family Medicine

## 2022-11-17 ENCOUNTER — Ambulatory Visit (INDEPENDENT_AMBULATORY_CARE_PROVIDER_SITE_OTHER): Payer: 59 | Admitting: Family Medicine

## 2022-11-17 ENCOUNTER — Other Ambulatory Visit (HOSPITAL_COMMUNITY): Payer: Self-pay

## 2022-11-17 VITALS — BP 100/63 | HR 80 | Temp 98.3°F | Resp 18 | Ht 62.0 in | Wt 182.2 lb

## 2022-11-17 DIAGNOSIS — L304 Erythema intertrigo: Secondary | ICD-10-CM

## 2022-11-17 MED ORDER — NYSTATIN-TRIAMCINOLONE 100000-0.1 UNIT/GM-% EX OINT
1.0000 | TOPICAL_OINTMENT | Freq: Two times a day (BID) | CUTANEOUS | 0 refills | Status: DC
Start: 2022-11-17 — End: 2023-01-19
  Filled 2022-11-17: qty 30, 15d supply, fill #0

## 2022-11-17 NOTE — Progress Notes (Signed)
Acute Office Visit  Subjective:     Patient ID: Susan Boyle, female    DOB: 19-May-1990, 32 y.o.   MRN: [id number]  Chief Complaint  Patient presents with   Rash    Patient states that she has a rash on her neck and states that is doesn't itch at all ususally will go away with the use of medication but states medication did not help. Patient also states that she had had rash over a year    Rash Patient is in today for acute visit.  Pt reports a year hx of rash at her neck that comes and goes. It's gone away at this moment but will flare up when she gets hot or sweaty. She has tried steroid creams for this and hasn't helped much. Right now, area has resolved. She is here for discussion of this and what the cause is.  Review of Systems  Skin:  Positive for rash.  All other systems reviewed and are negative.      Objective:    BP 100/63   Pulse 80   Temp 98.3 F (36.8 C) (Oral)   Resp 18   Ht 5\' 2"  (1.575 m)   Wt 182 lb 3.2 oz (82.6 kg)   SpO2 98%   BMI 33.32 kg/m    Physical Exam Vitals and nursing note reviewed.  Constitutional:      Appearance: Normal appearance. She is normal weight.  HENT:     Head: Normocephalic and atraumatic.     Right Ear: External ear normal.     Left Ear: External ear normal.     Nose: Nose normal.     Mouth/Throat:     Mouth: Mucous membranes are moist.     Pharynx: Oropharynx is clear.  Eyes:     Conjunctiva/sclera: Conjunctivae normal.     Pupils: Pupils are equal, round, and reactive to light.  Cardiovascular:     Rate and Rhythm: Normal rate.  Pulmonary:     Effort: Pulmonary effort is normal.  Skin:    General: Skin is warm.     Capillary Refill: Capillary refill takes less than 2 seconds.     Findings: No rash.  Neurological:     General: No focal deficit present.     Mental Status: She is alert and oriented to person, place, and time. Mental status is at baseline.  Psychiatric:        Mood and Affect: Mood normal.         Behavior: Behavior normal.        Thought Content: Thought content normal.        Judgment: Judgment normal.   No results found for any visits on 11/17/22.      Assessment & Plan:   Problem List Items Addressed This Visit   None Intertrigo -     Nystatin-Triamcinolone; Apply 1 Application topically 2 (two) times daily.  Dispense: 30 g; Refill: 0   Based on clinical symptoms and pt's description, suspect intertrigo. Will give rx for ointment to use bid prn for exacerbations. No new area at this time. Have advised pt to upload a picture and send to me with the next flare.   No orders of the defined types were placed in this encounter.   No follow-ups on file.  Suzan Slick, MD

## 2022-11-18 ENCOUNTER — Telehealth: Payer: Self-pay | Admitting: Orthopaedic Surgery

## 2022-11-18 ENCOUNTER — Encounter (HOSPITAL_BASED_OUTPATIENT_CLINIC_OR_DEPARTMENT_OTHER): Payer: Self-pay | Admitting: Orthopaedic Surgery

## 2022-11-18 NOTE — Telephone Encounter (Signed)
Noted for [employer]. 

## 2022-11-18 NOTE — Telephone Encounter (Signed)
Patient is requesting light duty note for work, no pushing,no pulling,no lifting/holding over 1lb, no fingering, no grasping. Please provide note if approved. Thank you.

## 2022-11-19 ENCOUNTER — Ambulatory Visit (INDEPENDENT_AMBULATORY_CARE_PROVIDER_SITE_OTHER): Payer: 59

## 2022-11-19 ENCOUNTER — Other Ambulatory Visit (HOSPITAL_COMMUNITY): Payer: Self-pay

## 2022-11-19 DIAGNOSIS — J309 Allergic rhinitis, unspecified: Secondary | ICD-10-CM | POA: Diagnosis not present

## 2022-11-21 NOTE — Therapy (Signed)
OUTPATIENT OCCUPATIONAL THERAPY ORTHO EVALUATION  Patient Name: Susan Boyle MRN: [id number] DOB:1990-07-10, 32 y.o., female Today's Date: 11/21/2022  PCP: Sundra Aland, MD REFERRING PROVIDER:  Huel Cote, MD    END OF SESSION:   Past Medical History:  Diagnosis Date   Allergy    Seasonal. Taking allergy injections   Anxiety    Asthma    Back pain, chronic    Depression    Knee pain, chronic    Past Surgical History:  Procedure Laterality Date   HERNIA REPAIR     inguinal hernia repair as infant   NERVE, TENDON AND ARTERY REPAIR Left 07/20/2013   Procedure: NERVE,  REPAIR;  Surgeon: Nicki Reaper, MD;  Location: [facility];  Service: Orthopedics;  Laterality: Left;   thumb surgery Left    Nerve repair   Patient Active Problem List   Diagnosis Date Noted   Attention deficit hyperactivity disorder (ADHD) 09/17/2022   IUD contraception 09/17/2022   Acute pain of right shoulder 03/22/2021   Seasonal and perennial allergic rhinoconjunctivitis 11/05/2020   Allergic reaction 07/04/2020   Food allergy 05/02/2020   Environmental allergies 05/02/2020   Dermatographism 05/02/2020   Asthma    Lateral epicondylitis of right elbow 09/26/2015   Subacromial bursitis 08/15/2015    ONSET DATE: ***  REFERRING DIAG: M79.631 (ICD-10-CM) - Pain in right forearm   THERAPY DIAG:  No diagnosis found.  Rationale for Evaluation and Treatment: Rehabilitation  SUBJECTIVE:   SUBJECTIVE STATEMENT: She states ***.    PERTINENT HISTORY: Radial tunnel syndrome program   PRECAUTIONS: {Therapy precautions:24002}  RED FLAGS: {PT Red Flags:29287}   WEIGHT BEARING RESTRICTIONS: {Yes ***/No:24003}  PAIN:  Are you having pain? Yes: NPRS scale: ***/10 Pain location: *** Pain description: *** Aggravating factors: *** Relieving factors: ***  FALLS: Has patient fallen in last 6 months? {fallsyesno:27318}  LIVING ENVIRONMENT: Lives with: {[facility] lives  with:25569::"lives with their family"} Lives in: {Lives in:25570} Stairs: {opstairs:27293} Has following equipment at home: {Assistive devices:23999}  PLOF: {PLOF:24004}  PATIENT GOALS: ***  NEXT MD VISIT: 5 weeks    OBJECTIVE: (All objective assessments below are from initial evaluation on: 11/26/22 unless otherwise specified.)    HAND DOMINANCE: Right ***  ADLs: Overall ADLs: States decreased ability to grab, hold household objects, pain and difficulty to open containers, perform FMS tasks (manipulate fasteners on clothing), mild to moderate bathing problems as well. ***   FUNCTIONAL OUTCOME MEASURES: Eval: Quick DASH ***% impairment today  (Higher % Score  =  More Impairment)    Patient Specific Functional Scale: *** (***, ***, ***)  (Higher Score  =  Better Ability for the Selected Tasks)     Patient Rated Wrist Evaluation (PRWE): Pain: ***/50; Function: ***/50; Total Score: ***/100 (Higher Score  =  More Pain and/or Debility)    UPPER EXTREMITY ROM     Shoulder to Wrist AROM Right eval Left eval  Shoulder flexion    Shoulder abduction    Shoulder extension    Shoulder internal rotation    Shoulder external rotation    Elbow flexion    Elbow extension    Forearm supination    Forearm pronation     Wrist flexion    Wrist extension    Wrist ulnar deviation    Wrist radial deviation    Functional dart thrower's motion (F-DTM) in ulnar flexion    F-DTM in radial extension     (Blank rows = not tested)   Hand AROM  Right eval Left eval  Full Fist Ability (or Gap to Distal Palmar Crease)    Thumb Opposition  (Kapandji Scale)     Thumb MCP (0-60)    Thumb IP (0-80)    Thumb Radial Abduction Span     Thumb Palmar Abduction Span     Index MCP (0-90)     Index PIP (0-100)     Index DIP (0-70)      Long MCP (0-90)      Long PIP (0-100)      Long DIP (0-70)      Ring MCP (0-90)      Ring PIP (0-100)      Ring DIP (0-70)      Little MCP (0-90)      Little  PIP (0-100)      Little DIP (0-70)      (Blank rows = not tested)   UPPER EXTREMITY MMT:    Eval: *** NT at eval due to recent and still healing injuries. Will be tested when appropriate.   MMT Right TBD Left TBD  Shoulder flexion    Shoulder abduction    Shoulder adduction    Shoulder extension    Shoulder internal rotation    Shoulder external rotation    Middle trapezius    Lower trapezius    Elbow flexion    Elbow extension    Forearm supination    Forearm pronation    Wrist flexion    Wrist extension    Wrist ulnar deviation    Wrist radial deviation    (Blank rows = not tested)  HAND FUNCTION: Eval: Observed weakness in affected *** hand.  Grip strength Right: *** lbs, Left: *** lbs   COORDINATION: Eval: Observed coordination impairments with affected *** hand. Box and Blocks Test: *** Blocks today (*** is [facility]); 9 Hole Peg Test Right: ***sec, Left: *** sec (*** sec is WFL)   SENSATION: Eval: *** Light touch intact today, though diminished around sx area    EDEMA:   Eval: *** Mildly swollen in *** hand and wrist today, ***cm circumferentially around ***  COGNITION: Eval: Overall cognitive status: WFL for evaluation today ***  OBSERVATIONS:   Eval: ***   TODAY'S TREATMENT:  Post-evaluation treatment: ***    PATIENT EDUCATION: Education details: See tx section above for details  Person educated: Patient Education method: Engineer, structural, Teach back, Handouts  Education comprehension: States and demonstrates understanding, Additional Education required    HOME EXERCISE PROGRAM: See tx section above for details    GOALS: Goals reviewed with patient? Yes   SHORT TERM GOALS: (STG required if POC>30 days) Target Date: ***  Pt will obtain protective, custom orthotic. Goal status: TBD/PRN,  MET ***  2.  Pt will demo/state understanding of initial HEP to improve pain levels and prerequisite motion. Goal status: INITIAL   LONG TERM  GOALS: Target Date: ***  Pt will improve functional ability by decreased impairment per Quick DASH / PSFS / PRWE assessment from *** to *** or better, for better quality of life. Goal status: INITIAL  2.  Pt will improve grip strength in *** hand from ***lbs to at least ***lbs for functional use at home and in IADLs. Goal status: INITIAL  3.  Pt will improve A/ROM in *** from *** to at least ***, to have functional motion for tasks like reach and grasp.  Goal status: INITIAL  4.  Pt will improve strength in *** from *** MMT to at  least *** MMT to have increased functional ability to carry out selfcare and higher-level homecare tasks with less difficulty. Goal status: INITIAL  5.  Pt will improve coordination skills in ***, as seen by better score on *** testing to have increased functional ability to carry out fine motor tasks (fasteners, etc.) and more complex, coordinated IADLs (meal prep, sports, etc.).  Goal status: INITIAL  6.  Pt will decrease pain at worst from ***/10 to ***/10 or better to have better sleep and occupational participation in daily roles. Goal status: INITIAL  ASSESSMENT:  CLINICAL IMPRESSION: Patient is a 32 y.o. female who was seen today for occupational therapy evaluation for ***.   PERFORMANCE DEFICITS: in functional skills including {OT physical skills:25468}, cognitive skills including {OT cognitive skills:25469}, and psychosocial skills including {OT psychosocial skills:25470}.   IMPAIRMENTS: are limiting patient from {OT performance deficits:25471}.   COMORBIDITIES: {Comorbidities:25485} that affects occupational performance. Patient will benefit from skilled OT to address above impairments and improve overall function.  MODIFICATION OR ASSISTANCE TO COMPLETE EVALUATION: {OT modification:25474}  OT OCCUPATIONAL PROFILE AND HISTORY: {OT PROFILE AND HISTORY:25484}  CLINICAL DECISION MAKING: {OT CDM:25475}  REHAB POTENTIAL:  {rehabpotential:25112}  EVALUATION COMPLEXITY: {Evaluation complexity:25115}      PLAN:  OT FREQUENCY: {rehab frequency:25116}  OT DURATION: {rehab duration:25117}  PLANNED INTERVENTIONS: {OT Interventions:25467}  RECOMMENDED OTHER SERVICES: ***  CONSULTED AND AGREED WITH PLAN OF CARE: {VZD:63875}  PLAN FOR NEXT SESSION: ***   Fannie Knee, OTR/L , CHT 11/21/2022, 9:57 AM

## 2022-11-24 DIAGNOSIS — Z0289 Encounter for other administrative examinations: Secondary | ICD-10-CM

## 2022-11-25 ENCOUNTER — Ambulatory Visit (INDEPENDENT_AMBULATORY_CARE_PROVIDER_SITE_OTHER): Payer: 59 | Admitting: *Deleted

## 2022-11-25 DIAGNOSIS — J309 Allergic rhinitis, unspecified: Secondary | ICD-10-CM | POA: Diagnosis not present

## 2022-11-26 ENCOUNTER — Other Ambulatory Visit: Payer: Self-pay

## 2022-11-26 ENCOUNTER — Ambulatory Visit: Payer: 59 | Admitting: Family Medicine

## 2022-11-26 ENCOUNTER — Ambulatory Visit: Payer: 59 | Admitting: Sports Medicine

## 2022-11-26 ENCOUNTER — Ambulatory Visit: Payer: Self-pay

## 2022-11-26 ENCOUNTER — Encounter: Payer: Self-pay | Admitting: Sports Medicine

## 2022-11-26 ENCOUNTER — Encounter: Payer: Self-pay | Admitting: Family Medicine

## 2022-11-26 ENCOUNTER — Encounter: Payer: Self-pay | Admitting: Rehabilitative and Restorative Service Providers"

## 2022-11-26 ENCOUNTER — Ambulatory Visit: Payer: 59 | Admitting: Rehabilitative and Restorative Service Providers"

## 2022-11-26 VITALS — BP 117/77 | HR 113 | Temp 98.7°F | Ht 63.0 in | Wt 180.0 lb

## 2022-11-26 DIAGNOSIS — F32A Depression, unspecified: Secondary | ICD-10-CM

## 2022-11-26 DIAGNOSIS — M6281 Muscle weakness (generalized): Secondary | ICD-10-CM | POA: Diagnosis not present

## 2022-11-26 DIAGNOSIS — G5631 Lesion of radial nerve, right upper limb: Secondary | ICD-10-CM

## 2022-11-26 DIAGNOSIS — E66811 Obesity, class 1: Secondary | ICD-10-CM

## 2022-11-26 DIAGNOSIS — M79601 Pain in right arm: Secondary | ICD-10-CM

## 2022-11-26 DIAGNOSIS — F419 Anxiety disorder, unspecified: Secondary | ICD-10-CM

## 2022-11-26 DIAGNOSIS — M79631 Pain in right forearm: Secondary | ICD-10-CM | POA: Diagnosis not present

## 2022-11-26 DIAGNOSIS — M25631 Stiffness of right wrist, not elsewhere classified: Secondary | ICD-10-CM | POA: Diagnosis not present

## 2022-11-26 DIAGNOSIS — Z6831 Body mass index (BMI) 31.0-31.9, adult: Secondary | ICD-10-CM

## 2022-11-26 DIAGNOSIS — E782 Mixed hyperlipidemia: Secondary | ICD-10-CM

## 2022-11-26 DIAGNOSIS — R202 Paresthesia of skin: Secondary | ICD-10-CM

## 2022-11-26 DIAGNOSIS — G5630 Lesion of radial nerve, unspecified upper limb: Secondary | ICD-10-CM

## 2022-11-26 DIAGNOSIS — E6609 Other obesity due to excess calories: Secondary | ICD-10-CM

## 2022-11-26 HISTORY — DX: Mixed hyperlipidemia: E78.2

## 2022-11-26 NOTE — Progress Notes (Signed)
Office: [phone]  /  Fax: [phone]   Initial Visit  Susan Boyle was seen in clinic today to evaluate for obesity. She is interested in losing weight to improve overall health and reduce the risk of weight related complications. She presents today to review program treatment options, initial physical assessment, and evaluation.     She was referred by: PCP  When asked what else they would like to accomplish? 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  She was ~115 lb prior to that.  She gained weight with birth control.  Anti depressants have also caused weight gain (Paxil).  She would like to get back to a size 6  When asked how has your weight affected you? She states: Contributed to medical problems, Having fatigue, Having poor endurance, and Has affected mood   Some associated conditions: Other: rare periods with cysts  Contributing factors: Family history, Nutritional, Medications, Stress, and Mental health problems  Weight promoting medications identified: Contraceptives or hormonal therapy and Paxil  Current nutrition plan: None  Current level of physical activity: Strength training  Current or previous pharmacotherapy: None  Response to medication: Never tried medications   Past medical history includes:   Past Medical History:  Diagnosis Date   Allergy    Seasonal. Taking allergy injections   Anxiety    Asthma    Back pain, chronic    Depression    Elevated cholesterol with high triglycerides 11/26/2022   Knee pain, chronic      Objective:   BP 117/77   Pulse (!) 113   Temp 98.7 F (37.1 C)   Ht 5\' 3"  (1.6 m)   Wt 180 lb (81.6 kg)   SpO2 96%   BMI 31.89 kg/m  She was weighed on the bioimpedance scale: Body mass index is 31.89 kg/m.  Peak  Weight:180 , Body Fat%:40.9, Visceral Fat Rating:8, Weight trend over the last 12 months: Increasing  General:  Alert, oriented and cooperative. Patient is in no acute distress.  Respiratory: Normal respiratory effort, no problems with respiration noted   Gait: able to ambulate independently  Mental Status: Normal mood and affect. Normal behavior. Normal judgment and thought content.   DIAGNOSTIC DATA REVIEWED:  BMET    Component Value Date/Time   NA 140 09/17/2022 1449   K 4.2 09/17/2022 1449   CL 103 09/17/2022 1449   CO2 23 09/17/2022 1449   GLUCOSE 84 09/17/2022 1449   GLUCOSE 101 (H) 03/05/2021 0811   BUN 11 09/17/2022 1449   CREATININE 0.67 09/17/2022 1449   CALCIUM 9.5 09/17/2022 1449   GFRNONAA >60 03/05/2021 0800   GFRAA >90 07/05/2013 2040   Lab Results  Component Value Date   HGBA1C 5.2 09/17/2022   No results found for: "INSULIN" CBC    Component Value Date/Time   WBC 6.0 09/17/2022 1449   WBC 5.1 03/05/2021 0800   RBC 4.44 09/17/2022 1449   RBC 4.28 03/05/2021 0800   HGB 13.5 09/17/2022 1449   HCT 40.1 09/17/2022 1449   PLT 246 09/17/2022 1449   MCV 90 09/17/2022 1449   MCH 30.4 09/17/2022 1449   MCH 29.9 03/05/2021 0800   MCHC 33.7 09/17/2022 1449   MCHC 33.2 03/05/2021 0800   RDW 12.9 09/17/2022 1449   Iron/TIBC/Ferritin/ %Sat No results found for: "IRON", "  TIBC", "FERRITIN", "IRONPCTSAT" Lipid Panel     Component Value Date/Time   CHOL 196 09/17/2022 1449   TRIG 167 (H) 09/17/2022 1449   HDL 61 09/17/2022 1449   CHOLHDL 3.2 09/17/2022 1449   LDLCALC 106 (H) 09/17/2022 1449   Hepatic Function Panel     Component Value Date/Time   PROT 6.8 09/17/2022 1449   ALBUMIN 4.6 09/17/2022 1449   AST 17 09/17/2022 1449   ALT 17 09/17/2022 1449   ALKPHOS 47 09/17/2022 1449   BILITOT 0.4 09/17/2022 1449      Component Value Date/Time   TSH 2.130 05/02/2020 1631     Assessment and Plan:   Anxiety and depression Assessment & Plan: Patient  notes a long history of emotional eating and continued weight gain while on paroxetine 20 mg nightly.  Her mood is fairly stable and she has good support system at home.  In addition, she takes Wellbutrin XL 150 mg once daily.  Consider tapering paroxetine and switching to a more weight friendly SSRI.  Continue to work on stress reduction, adequate sleep at night and good nutrition.  Look for improvements in mood with the addition of regular exercise.  Will work on [employer] out of the house.   Class 1 obesity due to excess calories with body mass index (BMI) of 31.0 to 31.9 in adult, unspecified whether serious comorbidity present  Elevated cholesterol with high triglycerides Assessment & Plan: Lab Results  Component Value Date   CHOL 196 09/17/2022   HDL 61 09/17/2022   LDLCALC 106 (H) 09/17/2022   TRIG 167 (H) 09/17/2022   CHOLHDL 3.2 09/17/2022  She reports being nonfasting during her labs obtained 09/17/2022.  She has never had elevated cholesterol in the past.  She has been working on reducing her intake of saturated fat and high sugar foods.  Repeat fasting lipid panel next visit         Obesity Treatment / Action Plan:  Patient will work on garnering support from family and friends to begin weight loss journey. Will work on eliminating or reducing the presence of highly palatable, calorie dense foods in the home. Will complete provided nutritional and psychosocial assessment questionnaire before the next appointment. Will be scheduled for indirect calorimetry to determine resting energy expenditure in a fasting state.  This will allow [place] to create a reduced calorie, high-protein meal plan to promote loss of fat mass while preserving muscle mass. Will think about ideas on how to incorporate physical activity into their daily routine. Counseled on the health benefits of losing 5%-15% of total body weight. Was counseled on nutritional approaches to weight loss and  benefits of reducing processed foods and consuming plant-based foods and high quality protein as part of nutritional weight management. Was counseled on pharmacotherapy and role as an adjunct in weight management.   Obesity Education Performed Today:  She was weighed on the bioimpedance scale and results were discussed and documented in the synopsis.  We discussed obesity as a disease and the importance of a more detailed evaluation of all the factors contributing to the disease.  We discussed the importance of long term lifestyle changes which include nutrition, exercise and behavioral modifications as well as the importance of customizing this to her specific health and social needs.  We discussed the benefits of reaching a healthier weight to alleviate the symptoms of existing conditions and reduce the risks of the biomechanical, metabolic and psychological effects of obesity.  Carlyon Shadow Alvira  appears to be in the action stage of change and states they are ready to start intensive lifestyle modifications and behavioral modifications.  30 minutes was spent today on this visit including the above counseling, pre-visit chart review, and post-visit documentation.  Reviewed by clinician on day of visit: allergies, medications, problem list, medical history, surgical history, family history, social history, and previous encounter notes pertinent to obesity diagnosis.    Seymour Bars, D.O. DABFM, DABOM [facility] [phone] [place], [place] [address] [phone]

## 2022-11-26 NOTE — Assessment & Plan Note (Signed)
Lab Results  Component Value Date   CHOL 196 09/17/2022   HDL 61 09/17/2022   LDLCALC 106 (H) 09/17/2022   TRIG 167 (H) 09/17/2022   CHOLHDL 3.2 09/17/2022  She reports being nonfasting during her labs obtained 09/17/2022.  She has never had elevated cholesterol in the past.  She has been working on reducing her intake of saturated fat and high sugar foods.  Repeat fasting lipid panel next visit

## 2022-11-26 NOTE — Assessment & Plan Note (Signed)
Patient notes a long history of emotional eating and continued weight gain while on paroxetine 20 mg nightly.  Her mood is fairly stable and she has good support system at home.  In addition, she takes Wellbutrin XL 150 mg once daily.  Consider tapering paroxetine and switching to a more weight friendly SSRI.  Continue to work on stress reduction, adequate sleep at night and good nutrition.  Look for improvements in mood with the addition of regular exercise.  Will work on [employer] out of the house.

## 2022-11-26 NOTE — Progress Notes (Signed)
Susan Boyle - 32 y.o. female MRN [id number]  Date of birth: Jul 09, 1990  Office Visit Note: Visit Date: 11/26/2022 PCP: Suzan Slick, MD Referred by: Huel Cote, MD  Subjective: No chief complaint on file.  HPI: Susan Boyle is a pleasant 32 y.o. female who presents today for evaluation and further management of pain in the right elbow and forearm.  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  Saw previously by my partner, Dr. Steward Drone. He thought her pain to be the diagnosis of radial tunnel syndrome.  She has had 1 evaluation for occupational therapy.  Sent here for further evaluation and possible radial nerve neurolysis/injection.  Her pain is worse at the end of the day, feels like a burning sensation in the upper forearm that will extend down.  She did see our occupational therapist, Earnest Rosier, today.  He did make a custom splint to give her some exercises to perform.  She will have this twice weekly.  Pertinent ROS were reviewed with the patient and found to be negative unless otherwise specified above in HPI.   Assessment & Plan: Visit Diagnoses:  1. Radial tunnel syndrome   2. Pain in right forearm    Plan: Discussed with [name] and her symptoms, this does seem indicative of likely radial tunnel syndrome with nerve entrapment.  She did have her first OT evaluation with a custom brace which she will wear during the day, she will continue her OT twice weekly.  Appreciate decision-making, we did elect to proceed with DBRN hydrodissection injection of the right arm between the supinator muscle and tendon sheath.  We will see over the coming weeks how she is improving.  If this gave her some relief but not full relief, we could always consider repeating this hydrodissection procedure.  She will follow-up with Dr. Steward Drone in a few weeks for reevaluation.  May use over-the-counter anti-inflammatories for postinjection pain. Continue OT and  HEP.  Follow-up: Return for Follow-up with Dr. Steward Drone as indicated.   Meds & Orders: No orders of the defined types were placed in this encounter.   Orders Placed This Encounter  Procedures   [place] Extrem Up Right Ltd    Procedures: *Ultrasound-guided radial nerve, deep branch, hydrodissection injection, right arm: After informed verbal consent was obtained, timeout was performed.  Patient was lying supine with the affected extremity in a prone to supine position.  Area overlying the radial tunnel of the arm was first anesthetized with 2 cc of lidocaine 1% after cleansing with multiple alcohol swabs.  The overlying area was then prepped with ChloraPrep, alcohol swabs and sterile precautions.  Utilizing ultrasound guidance, the deep branch of the radial nerve was identified between the supinator muscle tendon sheath.  Using a 22-gauge, 1.5 inch needle with a mixture of 1:1:4:1 of lidocaine:bupivicaine:D5W:betamethasone the tendon sheath of the cefdinir was injected and separated with appropriate hydrodissection of the deep branch of the radial nerve.  This was performed both proximally and distally to the arcade approach at the supinator sheath. Patient tolerated procedure well.       Clinical History: No specialty comments available.  She reports that she has been smoking cigarettes and e-cigarettes. She has a 2.5 pack-year smoking history. She has never used smokeless tobacco.  Recent Labs    09/17/22 1449  HGBA1C 5.2    Objective:    Physical Exam  Gen: Well-appearing, in no acute distress; non-toxic CV: Well-perfused. Warm.  Resp: Breathing unlabored  on room air; no wheezing. Psych: Fluid speech in conversation; appropriate affect; normal thought process Neuro: Sensation intact throughout. No gross coordination deficits.   Ortho Exam - Right elbow/forearm: + Tinel's over the radial aspect of the proximal forearm a few inches distal to the lateral epicondyle.  There was some  weakness secondary to pain with resisted wrist extension and resisted pronation/supination.  No redness swelling or effusion.  Imaging: [place] Extrem Up Right Ltd  Result Date: 11/26/2022 Limited musculoskeletal ultrasound of the right upper extremity, right elbow and forearm was performed today.  Evaluation of the radiocapitellar joint was seen without effusion.  There is no cartilage defects of the coronoid.  The radial nerve was followed dynamically proximal from the elbow joint distally through the arcade of [place] and distally through the pronator teres and forearm musculature.  The deep and superficial radial nerve was seen split on ultrasound.  No muscular abnormality of the brachial radialis or brachialis musculature. *Technically successful ultrasound-guided radial nerve, deep branch, hydrodissection and injection of the right elbow.     DG Forearm Right CLINICAL DATA:  Forearm pain.  EXAM: RIGHT FOREARM - 2 VIEW  COMPARISON:  None Available.  FINDINGS: There is no evidence of fracture or other focal bone lesions. Soft tissues are unremarkable.  IMPRESSION: Negative.  Electronically Signed   By: Layla Maw M.D.   On: 11/15/2022 21:26    Past Medical/Family/Surgical/Social History: Medications & Allergies reviewed per EMR, new medications updated. Patient Active Problem List   Diagnosis Date Noted   Attention deficit hyperactivity disorder (ADHD) 09/17/2022   IUD contraception 09/17/2022   Acute pain of right shoulder 03/22/2021   Seasonal and perennial allergic rhinoconjunctivitis 11/05/2020   Allergic reaction 07/04/2020   Food allergy 05/02/2020   Environmental allergies 05/02/2020   Dermatographism 05/02/2020   Asthma    Lateral epicondylitis of right elbow 09/26/2015   Subacromial bursitis 08/15/2015   Past Medical History:  Diagnosis Date   Allergy    Seasonal. Taking allergy injections   Anxiety    Asthma    Back pain, chronic    Depression     Knee pain, chronic    Family History  Adopted: Yes  Problem Relation Age of Onset   Heart disease Father 56       MI   Cancer Maternal Grandfather    Vision loss Maternal Grandmother    Vision loss Maternal Uncle    Past Surgical History:  Procedure Laterality Date   HERNIA REPAIR     inguinal hernia repair as infant   NERVE, TENDON AND ARTERY REPAIR Left 07/20/2013   Procedure: NERVE,  REPAIR;  Surgeon: Nicki Reaper, MD;  Location: [facility];  Service: Orthopedics;  Laterality: Left;   thumb surgery Left    Nerve repair   Social History   Occupational History   Not on file  Tobacco Use   Smoking status: Every Day    Current packs/day: 0.00    Average packs/day: 0.3 packs/day for 10.0 years (2.5 ttl pk-yrs)    Types: Cigarettes, E-cigarettes    Last attempt to quit: 01/25/2019    Years since quitting: 3.8   Smokeless tobacco: Never   Tobacco comments:    stopped in November 2021   Vaping Use   Vaping status: Every Day   Substances: Nicotine  Substance and Sexual Activity   Alcohol use: Yes    Alcohol/week: 5.0 standard drinks of alcohol    Types: 5 Glasses of wine  per week    Comment: social   Drug use: No   Sexual activity: Yes    Birth control/protection: I.U.D.

## 2022-11-27 ENCOUNTER — Encounter: Payer: Self-pay | Admitting: Rehabilitative and Restorative Service Providers"

## 2022-11-27 ENCOUNTER — Ambulatory Visit: Payer: 59 | Admitting: Rehabilitative and Restorative Service Providers"

## 2022-11-27 DIAGNOSIS — M25631 Stiffness of right wrist, not elsewhere classified: Secondary | ICD-10-CM

## 2022-11-27 DIAGNOSIS — M79601 Pain in right arm: Secondary | ICD-10-CM

## 2022-11-27 DIAGNOSIS — M6281 Muscle weakness (generalized): Secondary | ICD-10-CM

## 2022-11-27 DIAGNOSIS — R202 Paresthesia of skin: Secondary | ICD-10-CM

## 2022-11-27 NOTE — Therapy (Signed)
OUTPATIENT OCCUPATIONAL THERAPY  Patient Name: Susan Boyle MRN: [id number] DOB:[birth year], 32 y.o., female Today's Date: 11/27/2022  PCP: Sundra Aland, MD REFERRING PROVIDER:  Huel Cote, MD    END OF SESSION:    Past Medical History:  Diagnosis Date   Allergy    Seasonal. Taking allergy injections   Anxiety    Asthma    Back pain, chronic    Depression    Elevated cholesterol with high triglycerides 11/26/2022   Knee pain, chronic    Past Surgical History:  Procedure Laterality Date   HERNIA REPAIR     inguinal hernia repair as infant   NERVE, TENDON AND ARTERY REPAIR Left 07/20/2013   Procedure: NERVE,  REPAIR;  Surgeon: Nicki Reaper, MD;  Location: [facility];  Service: Orthopedics;  Laterality: Left;   thumb surgery Left    Nerve repair   Patient Active Problem List   Diagnosis Date Noted   Anxiety and depression 11/26/2022   Class 1 obesity due to excess calories with body mass index (BMI) of 31.0 to 31.9 in adult 11/26/2022   Elevated cholesterol with high triglycerides 11/26/2022   Attention deficit hyperactivity disorder (ADHD) 09/17/2022   IUD contraception 09/17/2022   Acute pain of right shoulder 03/22/2021   Seasonal and perennial allergic rhinoconjunctivitis 11/05/2020   Allergic reaction 07/04/2020   Food allergy 05/02/2020   Environmental allergies 05/02/2020   Dermatographism 05/02/2020   Asthma    Lateral epicondylitis of right elbow 09/26/2015   Subacromial bursitis 08/15/2015      Pt came in for quick orthotic adjustment and an extra strap and there was no significant treatment performed today.  No charge.     Fannie Knee, OTR/L , CHT 11/27/2022, 2:35 PM

## 2022-11-28 NOTE — Therapy (Signed)
OUTPATIENT OCCUPATIONAL THERAPY TREATMENT NOTE  Patient Name: DANITY CHLADEK MRN: [id number] DOB:[birth year], 32 y.o., female Today's Date: 12/03/2022  PCP: Sundra Aland, MD REFERRING PROVIDER:  Huel Cote, MD    END OF SESSION:  OT End of Session - 12/03/22 0853     Visit Number 2    Number of Visits 10    Date for OT Re-Evaluation 01/09/23    Authorization Type [employer]    OT Start Time [phone]    OT Stop Time [phone]    OT Time Calculation (min) 40 min    Equipment Utilized During Treatment tubular compression sleeves    Activity Tolerance Patient tolerated treatment well;No increased pain;Patient limited by pain;Patient limited by fatigue    Behavior During Therapy [facility] for tasks assessed/performed;Anxious              Past Medical History:  Diagnosis Date   Allergy    Seasonal. Taking allergy injections   Anxiety    Asthma    Back pain, chronic    Depression    Elevated cholesterol with high triglycerides 11/26/2022   Knee pain, chronic    Past Surgical History:  Procedure Laterality Date   HERNIA REPAIR     inguinal hernia repair as infant   NERVE, TENDON AND ARTERY REPAIR Left 07/20/2013   Procedure: NERVE,  REPAIR;  Surgeon: Nicki Reaper, MD;  Location: [facility];  Service: Orthopedics;  Laterality: Left;   thumb surgery Left    Nerve repair   Patient Active Problem List   Diagnosis Date Noted   Anxiety and depression 11/26/2022   Class 1 obesity due to excess calories with body mass index (BMI) of 31.0 to 31.9 in adult 11/26/2022   Elevated cholesterol with high triglycerides 11/26/2022   Attention deficit hyperactivity disorder (ADHD) 09/17/2022   IUD contraception 09/17/2022   Acute pain of right shoulder 03/22/2021   Seasonal and perennial allergic rhinoconjunctivitis 11/05/2020   Allergic reaction 07/04/2020   Food allergy 05/02/2020   Environmental allergies 05/02/2020   Dermatographism 05/02/2020   Asthma     Lateral epicondylitis of right elbow 09/26/2015   Subacromial bursitis 08/15/2015    ONSET DATE: July 2024 DOI  REFERRING DIAG: N62.952 (ICD-10-CM) - Pain in right forearm   THERAPY DIAG:  Muscle weakness (generalized)  Pain in right arm  Paresthesia of skin  Stiffness of right wrist, not elsewhere classified  Rationale for Evaluation and Treatment: Rehabilitation  PERTINENT HISTORY: Radial tunnel syndrome program  She states she was in a "pushup" position with 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 She tried a wrist cock-up prefab for awhile, but was advised to stop by other medical provider as her injury isn't a "wrist sprain."  PRECAUTIONS: None  RED FLAGS: None   WEIGHT BEARING RESTRICTIONS: No    SUBJECTIVE:   SUBJECTIVE STATEMENT: She states playing with kids and dogs a lot over the weekend, having less pain, but it "crescendo"s at the end of the day. She admits to taking off her brace to play with the dogs and getting "stabs" of pain, and OT told her to NOT do these things. She is reporting being fairly  non-compliant with bracing so far.    PAIN:  Are you having pain?  Yes: NPRS scale: 2/10 at rest now, but in past week, goes up to 5/10 Pain location: Rt arm dorsal radial nerve distribution Pain description: burning, aching, "lightning" Aggravating factors: motion Relieving factors: rest, ice    PATIENT GOALS: To improve pain levels and increased use of the right dominant hand.  NEXT MD VISIT: 5 weeks    OBJECTIVE: (All objective assessments below are from initial evaluation on: 11/26/22 unless otherwise specified.)   HAND DOMINANCE: Right   ADLs: Overall ADLs: States decreased ability to grab,  hold household objects, pain and difficulty to open containers, perform FMS tasks (manipulate fasteners on clothing), mild to moderate bathing problems as well.    FUNCTIONAL OUTCOME MEASURES: Eval: Patient Specific Functional Scale: 1 /10  (opening zip locks, writing, open tubes)  (Higher Score  =  Better Ability for the Selected Tasks)      UPPER EXTREMITY ROM     Shoulder to Wrist AROM Right eval Rt 12/03/22   Forearm supination 65 61  Forearm pronation  72 83  Wrist flexion 71 47  Wrist extension 57 37  (Blank rows = not tested)   Hand AROM Right eval  Full Fist Ability (or Gap to Distal Palmar Crease) full  Thumb Opposition  (Kapandji Scale)  WFL  (Blank rows = not tested)   UPPER EXTREMITY MMT:    Eval:  NT at eval due to high pain, but appears overtly weaker and inhibited in Rt wrist and forearm. Details TBD   MMT Right TBD  Shoulder flexion   Shoulder abduction   Shoulder adduction   Shoulder extension   Shoulder internal rotation   Shoulder external rotation   Middle trapezius   Lower trapezius   Elbow flexion   Elbow extension   Forearm supination   Forearm pronation   Wrist flexion   Wrist extension   Wrist ulnar deviation   Wrist radial deviation   (Blank rows = not tested)  HAND FUNCTION: Eval: Observed weakness in affected Rt hand.  Details when time allows Grip strength Right: TBD lbs, Left: TBD lbs   COORDINATION: Eval: Observed coordination impairments with affected right hand.  Details when time allows 9 Hole Peg Test Right: TBD sec(TBD sec is [facility])   SENSATION: Eval: Complaints of pain from the arcade approach to the dorsal distal wrist crease, sharp and stabbing at times with supination and wrist extension motions, no paresthesia to tips of fingers  OBSERVATIONS:   Eval: Pain near the arcade of motion the right dorsal forearm, pain with wrist extension or supination typical of radial tunnel syndrome.   TODAY'S TREATMENT:  12/03/22:  She performs active range of motion from measurement check today which shows no significant improvements in tenderness with the same motion she had last week.  Apparently the procedure done by Dr. Shon Baton has not been that helpful to her, or she is simply overused her arm with heavy activities like lifting her nieces and nephews and roughly playing with dogs (7 puppies) in the past week.  OT spends a significant amount of time today on safety education to rest 80 to 90% of the time and only have nonpainful activities 10 to 20% of the time is much as possible.  Her orthosis is fitting very well and she needs to wear it more often and only remove it for hygiene, light and gentle exercises.  As she still was significantly tender and  painful today, her HEP was reviewed with her and she performs each 1 back slowly with cues, supervision and modifications to make them nonpainful for her.  For example, when she does her wrist extension stretch she was carefully told to not rest her dorsal forearm on the edge of the table but to do in a neutral position to prevent that pressure.  When doing like that she had no issues with the stretch and it was good and appropriate.  She had less pain at the end of the session and states understanding this whole therapy process better now.   Exercises - Tricep Stretch- DO SEATED BY TABLE  - 3-4 x daily - 3 reps - 15 hold - Seated Wrist Extension Stretch  - 3-6 x daily - 3-5 reps - 15 hold - Wrist Flexion Stretch  - 4 x daily - 3-5 reps - 15 sec hold - Turn [employer] Facing Up & Down  - 3-4 x daily - 5-10 reps - Standing Radial Nerve Glide  - 3-4 x daily - 3-5 reps    Exercises - Turn Palm Facing Up & Down  - 3-4 x daily - 5-10 reps - Wrist Flexion Stretch  - 4 x daily - 3-5 reps - 15 sec hold - Seated Wrist Extension Stretch  - 3-6 x daily - 3-5 reps - 15 hold - Tricep Stretch- DO SEATED BY TABLE  - 3-4 x daily - 3 reps - 15 hold - Standing Radial Nerve Glide  - 3-4 x daily -  3-5 reps   PATIENT EDUCATION: Education details: See tx section above for details  Person educated: Patient Education method: Verbal Instruction, Teach back, Handouts  Education comprehension: States and demonstrates understanding, Additional Education required    HOME EXERCISE PROGRAM: Access Code: [email or web address] URL: [email or web address] Date: 11/26/2022 Prepared by: Fannie Knee   GOALS: Goals reviewed with patient? Yes   SHORT TERM GOALS: (STG required if POC>30 days) Target Date: 12/12/22  Pt will obtain protective, custom orthotic. Goal status:  MET at eval  2.  Pt will demo/state understanding of initial HEP to improve pain levels and prerequisite motion. Goal status: INITIAL   LONG TERM GOALS: Target Date: 01/09/23  Pt will improve functional ability by decreased impairment per PSFS assessment from 1 / 10 to 8/10 or better, for better quality of life. Goal status: INITIAL  2.  Pt will improve grip strength in Rt hand from NT to at least 40lbs for functional use at home and in IADLs. Goal status: INITIAL  3.  Pt will improve A/ROM in Rt wrist ext from 57 to at least 70, to have functional motion for tasks like reach and grasp.  Goal status: INITIAL  4.  Pt will improve strength in Rt wrist ext from NT and painful to at least 4/5 MMT to have increased functional ability to carry out selfcare and higher-level homecare tasks with less difficulty. Goal status: INITIAL  5.  Pt will decrease pain at worst from 7/10 to 3/10 or better to have better sleep and occupational participation in daily roles. Goal status: INITIAL  ASSESSMENT:  CLINICAL IMPRESSION: 12/03/22: Unfortunately she remains very tender to touch and decreased functional tolerance still, possibly due to stated noncompliance with original recommendations and therapy.  Her orthotic fits great-but she states "not wearing it to protect it" versus wearing it to protect herself.  OT reiterates  to her multiple times that she should be doing 80 to 90% resting and only 10 to 20%  light activity that does not hurt her during this initial/acute phase of treatment.  Hopefully she can be compliant with this.   PLAN:  OT FREQUENCY: 1-2x/week  OT DURATION: 6 weeks through 01/09/23 as needed up to 10 total visits   PLANNED INTERVENTIONS: self care/ADL training, therapeutic exercise, therapeutic activity, neuromuscular re-education, manual therapy, gait training, splinting, electrical stimulation, ultrasound, iontophoresis, fluidotherapy, compression bandaging, moist heat, cryotherapy, contrast bath, patient/family education, energy conservation, coping strategies training, Re-evaluation, and Dry needling   CONSULTED AND AGREED WITH PLAN OF CARE: Patient  PLAN FOR NEXT SESSION:  Discussed compliance with resting and wearing orthosis and light modified exercises and activities.  Check orthosis and start weaning as soon as pain is relatively low, consistently for 1 to 2 weeks, review home exercise program and progress from gentle motions to light stretches as tolerated and eventually to eccentric strengthening etc.    Fannie Knee, OTR/L , CHT 12/03/2022, 10:42 AM

## 2022-12-02 DIAGNOSIS — J3081 Allergic rhinitis due to animal (cat) (dog) hair and dander: Secondary | ICD-10-CM | POA: Diagnosis not present

## 2022-12-02 NOTE — Progress Notes (Signed)
VIALS EXP 12-02-23

## 2022-12-03 ENCOUNTER — Encounter: Payer: Self-pay | Admitting: Rehabilitative and Restorative Service Providers"

## 2022-12-03 ENCOUNTER — Ambulatory Visit: Payer: 59 | Admitting: Rehabilitative and Restorative Service Providers"

## 2022-12-03 ENCOUNTER — Encounter (HOSPITAL_COMMUNITY): Payer: Self-pay

## 2022-12-03 ENCOUNTER — Ambulatory Visit (HOSPITAL_COMMUNITY)
Admission: EM | Admit: 2022-12-03 | Discharge: 2022-12-03 | Disposition: A | Discharge status: Home / Self Care | Payer: 59 | Attending: Family Medicine | Admitting: Family Medicine

## 2022-12-03 ENCOUNTER — Encounter: Payer: Self-pay | Admitting: Sports Medicine

## 2022-12-03 ENCOUNTER — Encounter (HOSPITAL_BASED_OUTPATIENT_CLINIC_OR_DEPARTMENT_OTHER): Payer: Self-pay | Admitting: Orthopaedic Surgery

## 2022-12-03 DIAGNOSIS — M79631 Pain in right forearm: Secondary | ICD-10-CM

## 2022-12-03 DIAGNOSIS — M6281 Muscle weakness (generalized): Secondary | ICD-10-CM | POA: Diagnosis not present

## 2022-12-03 DIAGNOSIS — M79601 Pain in right arm: Secondary | ICD-10-CM | POA: Diagnosis not present

## 2022-12-03 DIAGNOSIS — M25631 Stiffness of right wrist, not elsewhere classified: Secondary | ICD-10-CM

## 2022-12-03 DIAGNOSIS — R202 Paresthesia of skin: Secondary | ICD-10-CM | POA: Diagnosis not present

## 2022-12-03 MED ORDER — DEXAMETHASONE SODIUM PHOSPHATE 10 MG/ML IJ SOLN
INTRAMUSCULAR | Status: AC
  Filled 2022-12-03: qty 1

## 2022-12-03 MED ORDER — HYDROCODONE-ACETAMINOPHEN 5-325 MG PO TABS
1.0000 | ORAL_TABLET | Freq: Four times a day (QID) | ORAL | 0 refills | Status: DC | PRN
Start: 2022-12-03 — End: 2023-01-19

## 2022-12-03 MED ORDER — DEXAMETHASONE SODIUM PHOSPHATE 10 MG/ML IJ SOLN
10.0000 mg | Freq: Once | INTRAMUSCULAR | Status: AC
  Administered 2022-12-03: 10 mg via INTRAMUSCULAR

## 2022-12-03 MED ORDER — MORPHINE SULFATE (PF) 2 MG/ML IV SOLN
INTRAVENOUS | Status: AC
  Filled 2022-12-03: qty 2

## 2022-12-03 MED ORDER — MORPHINE SULFATE (PF) 2 MG/ML IV SOLN
4.0000 mg | Freq: Once | INTRAVENOUS | Status: AC
  Administered 2022-12-03: 4 mg via INTRAMUSCULAR

## 2022-12-03 NOTE — ED Triage Notes (Signed)
Pt presents to the office for right arm pain that started today. Pt states she has history of radial tunnel syndrome. PAIN 10/10

## 2022-12-03 NOTE — ED Notes (Signed)
Patient's friend Shanda Bumps present in room to drive patient home after administration of IM morphine 4mg .

## 2022-12-03 NOTE — Discharge Instructions (Addendum)
Be aware, you have been prescribed pain medications that may cause drowsiness. While taking this medication, do not take any other medications containing acetaminophen (Tylenol). Do not combine with alcohol or recreational drugs. Please do not drive, operate heavy machinery, or take part in activities that require making important decisions while on this medication as your judgement may be clouded.  

## 2022-12-04 NOTE — ED Provider Notes (Signed)
[facility]   [id number] 12/03/22 Arrival Time: 1820  ASSESSMENT & PLAN:  1. Right forearm pain    Plans to call her specialist tomorrow morning.  Meds ordered this encounter  Medications   HYDROcodone-acetaminophen (NORCO/VICODIN) 5-325 MG tablet    Sig: Take 1 tablet by mouth every 6 (six) hours as needed for moderate pain or severe pain.    Dispense:  6 tablet    Refill:  0   morphine (PF) 2 MG/ML injection 4 mg   dexamethasone (DECADRON) injection 10 mg   R forearm neuro/vasc intact. Recommend:  Follow-up Information     [facility].   Specialty: Emergency Medicine Why: If symptoms worsen in any way. Contact information: [address] [place] [address] [phone]               Reviewed expectations re: course of current medical issues. Questions answered. Outlined signs and symptoms indicating need for more acute intervention. Patient verbalized understanding. After Visit Summary given.  SUBJECTIVE: History from: patient. Susan Boyle is a 32 y.o. female who reports right arm pain that started today. Pt states she has history of radial tunnel syndrome. PAIN 10/10. Unclear cause of flare. In PT currently; followed by several providers for this. Denies trauma. Mild tingling in hand also.  Past Surgical History:  Procedure Laterality Date   HERNIA REPAIR     inguinal hernia repair as infant   NERVE, TENDON AND ARTERY REPAIR Left 07/20/2013   Procedure: NERVE,  REPAIR;  Surgeon: Nicki Reaper, MD;  Location: [facility];  Service: Orthopedics;  Laterality: Left;   thumb surgery Left    Nerve repair      OBJECTIVE:  Vitals:   12/03/22 1849  BP: 116/72  Pulse: 100  Resp: 16  Temp: 98.5 F (36.9 C)  TempSrc: Oral  SpO2: 97%    General appearance: alert; no distress HEENT: [place]; AT Neck: supple with FROM Resp: unlabored respirations Extremities: RUE:  warm with well perfused appearance; in custom-made splint; with intact distal sensation and normal capillary refill Skin: warm and dry; no visible rashes Psychological: alert and cooperative; normal mood and affect     Allergies  Allergen Reactions   Sulfamethoxazole Nausea And Vomiting    Past Medical History:  Diagnosis Date   Allergy    Seasonal. Taking allergy injections   Anxiety    Asthma    Back pain, chronic    Depression    Elevated cholesterol with high triglycerides 11/26/2022   Knee pain, chronic    Social History   Socioeconomic History   Marital status: Married    Spouse name: Not on file   Number of children: 0   Years of education: Not on file   Highest education level: Some college, no degree  Occupational History   Not on file  Tobacco Use   Smoking status: Every Day    Current packs/day: 0.00    Average packs/day: 0.3 packs/day for 10.0 years (2.5 ttl pk-yrs)    Types: Cigarettes, E-cigarettes    Last attempt to quit: 01/25/2019    Years since quitting: 3.8   Smokeless tobacco: Never   Tobacco comments:    stopped in November 2021   Vaping Use   Vaping status: Every Day   Substances: Nicotine  Substance and Sexual Activity   Alcohol use: Yes    Alcohol/week: 5.0 standard drinks of alcohol    Types: 5  Glasses of wine per week    Comment: social   Drug use: No   Sexual activity: Yes    Birth control/protection: I.U.D.  Other Topics Concern   Not on file  Social History Narrative   Not on file   Social Determinants of Health   Financial Resource Strain: Medium Risk (10/16/2022)   Overall Financial Resource Strain (CARDIA)    Difficulty of Paying Living Expenses: Somewhat hard  Food Insecurity: No Food Insecurity (10/16/2022)   Hunger Vital Sign    Worried About Running Out of Food in the Last Year: Never true    Ran Out of Food in the Last Year: Never true  Transportation Needs: No Transportation Needs (10/16/2022)   PRAPARE -  Administrator, Civil Service (Medical): No    Lack of Transportation (Non-Medical): No  Physical Activity: Insufficiently Active (10/16/2022)   Exercise Vital Sign    Days of Exercise per Week: 1 day    Minutes of Exercise per Session: 30 min  Stress: Stress Concern Present (10/16/2022)   [employer] of Occupational Health - Occupational Stress Questionnaire    Feeling of Stress : Very much  Social Connections: Socially Isolated (10/16/2022)   Social Connection and Isolation Panel [NHANES]    Frequency of Communication with Friends and Family: Once a week    Frequency of Social Gatherings with Friends and Family: Once a week    Attends Religious Services: Never    Database administrator or Organizations: No    Attends Engineer, structural: Not on file    Marital Status: Living with partner   Family History  Adopted: Yes  Problem Relation Age of Onset   Heart disease Father 29       MI   Cancer Maternal Grandfather    Vision loss Maternal Grandmother    Vision loss Maternal Uncle    Past Surgical History:  Procedure Laterality Date   HERNIA REPAIR     inguinal hernia repair as infant   NERVE, TENDON AND ARTERY REPAIR Left 07/20/2013   Procedure: NERVE,  REPAIR;  Surgeon: Nicki Reaper, MD;  Location: [facility];  Service: Orthopedics;  Laterality: Left;   thumb surgery Left    Nerve repair       [name], MD 12/04/22 1012
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
## 2022-12-05 ENCOUNTER — Encounter: Payer: Self-pay | Admitting: Rehabilitative and Restorative Service Providers"

## 2022-12-05 ENCOUNTER — Ambulatory Visit: Payer: 59 | Admitting: Sports Medicine

## 2022-12-05 ENCOUNTER — Ambulatory Visit: Payer: 59 | Admitting: Rehabilitative and Restorative Service Providers"

## 2022-12-05 ENCOUNTER — Encounter: Payer: Self-pay | Admitting: Sports Medicine

## 2022-12-05 DIAGNOSIS — M79601 Pain in right arm: Secondary | ICD-10-CM | POA: Diagnosis not present

## 2022-12-05 DIAGNOSIS — R202 Paresthesia of skin: Secondary | ICD-10-CM

## 2022-12-05 DIAGNOSIS — G5630 Lesion of radial nerve, unspecified upper limb: Secondary | ICD-10-CM | POA: Diagnosis not present

## 2022-12-05 DIAGNOSIS — M79631 Pain in right forearm: Secondary | ICD-10-CM

## 2022-12-05 DIAGNOSIS — M6281 Muscle weakness (generalized): Secondary | ICD-10-CM | POA: Diagnosis not present

## 2022-12-05 DIAGNOSIS — M25631 Stiffness of right wrist, not elsewhere classified: Secondary | ICD-10-CM

## 2022-12-05 NOTE — Progress Notes (Signed)
Patient says that today she has no pain. She says that 3 hours into work yesterday she began having the pain again and had to leave work. She is inquiring about a note for work.

## 2022-12-05 NOTE — Progress Notes (Signed)
Susan Boyle - 32 y.o. female MRN [id number]  Date of birth: [birth year]  Office Visit Note: Visit Date: 12/05/2022 PCP: Suzan Slick, MD Referred by: Suzan Slick, MD  Subjective: Chief Complaint  Patient presents with   Right Wrist - Pain   HPI: Susan Boyle is a pleasant 32 y.o. female who presents today for right arm/forearm pain, previously diagnosed with radial tunnel syndrome.  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  She did go to the ED and they gave her morphine.  I did perform radial nerve, deep branch hydrodissection injection back on 11/26/2022.  She states for hours after this and the remainder of that day she had absolutely no pain in the forearm and wrist for the first time in months.  Some of her pain did return over the next few days, but today she states she has really no pain.  She has essentially no pain at rest, but her pain will be aggravated with activities she does at her job. At previous visit, Dr. Steward Drone did give the following restrictions:  Dr. Steward Drone restrictions provided: "light duty note for work, no pushing,no pulling,no lifting/holding over 1lb, no fingering, no grasping."  This has been difficult to follow with her job and feels like they have not adhered to these restrictions.  She has pain with repetitive motion and any lifting of the arm.  She was not wearing her custom brace correctly as our OT, Earnest Rosier, originally prescribed but she did have an appointment with him today and has corrected this and it does feel well and that custom splint/brace.  Pertinent ROS were reviewed with the patient and found to be negative unless otherwise specified above in HPI.   Assessment & Plan: Visit Diagnoses:  1. Radial tunnel syndrome   2. Pain in right forearm    Plan: Discussed with Christon that given her symptoms and her essentially 100% pain relief that was temporary from the DBRN hydrodissection, this does  likely confirm her radial tunnel diagnosis.  Although there is some hesitancy and lack of use for her pain that is likely complicating the issue.  She essentially now has no pain at rest, but lifting, RUE use, and repetitive activity does cause her pain. She will continue the restrictions that Dr. Steward Drone gave her, but I will add no lifting or repetitive activity of the right upper extremity.  She is able to type (computer) with both hands, but otherwise avoid use of right upper extremity, has full use of left upper extremity.  She will continue these restrictions until she sees Dr. Steward Drone for her follow-up on 12/24/22 and he will modify as see fit.  I did discuss these restrictions with office staff, Tammy, who is helping with [employer] and disability.  She will continue her occupational therapy and the use of her custom brace/splint as indicated.  She will continue routine follow-up with Dr. Steward Drone.  Follow-up: Return for with Dr. Steward Drone for radial tunnel.   Meds & Orders: No orders of the defined types were placed in this encounter.  No orders of the defined types were placed in this encounter.    Procedures: No procedures performed      Previous procedure - Radial nerve, deep branch hydrodissection:  Clinical History: No specialty comments available.  She reports that she has been smoking cigarettes and e-cigarettes. She has a 2.5 pack-year smoking history. She has never used smokeless tobacco.  Recent  Labs    09/17/22 1449  HGBA1C 5.2    Objective:    Physical Exam  Gen: Well-appearing, in no acute distress; non-toxic CV:  Well-perfused. Warm.  Resp: Breathing unlabored on room air; no wheezing. Psych: Fluid speech in conversation; appropriate affect; normal thought process Neuro: Sensation intact throughout. No gross coordination deficits.   Ortho Exam -  Right elbow/forearm: + Tinel's over the radial aspect of the proximal forearm a few inches distal to the lateral  epicondyle.  There is pain pain with resisted wrist extension and resisted pronation/supination, but no appreciable weakness.  No redness swelling or effusion.   Imaging: No results found.  Past Medical/Family/Surgical/Social History: Medications & Allergies reviewed per EMR, new medications updated. Patient Active Problem List   Diagnosis Date Noted   Anxiety and depression 11/26/2022   Class 1 obesity due to excess calories with body mass index (BMI) of 31.0 to 31.9 in adult 11/26/2022   Elevated cholesterol with high triglycerides 11/26/2022   Attention deficit hyperactivity disorder (ADHD) 09/17/2022   IUD contraception 09/17/2022   Acute pain of right shoulder 03/22/2021   Seasonal and perennial allergic rhinoconjunctivitis 11/05/2020   Allergic reaction 07/04/2020   Food allergy 05/02/2020   Environmental allergies 05/02/2020   Dermatographism 05/02/2020   Asthma    Lateral epicondylitis of right elbow 09/26/2015   Subacromial bursitis 08/15/2015   Past Medical History:  Diagnosis Date   Allergy    Seasonal. Taking allergy injections   Anxiety    Asthma    Back pain, chronic    Depression    Elevated cholesterol with high triglycerides 11/26/2022   Knee pain, chronic    Family History  Adopted: Yes  Problem Relation Age of Onset   Heart disease Father 12       MI   Cancer Maternal Grandfather    Vision loss Maternal Grandmother    Vision loss Maternal Uncle    Past Surgical History:  Procedure Laterality Date   HERNIA REPAIR     inguinal hernia repair as infant   NERVE, TENDON AND ARTERY REPAIR Left 07/20/2013   Procedure: NERVE,  REPAIR;  Surgeon: Nicki Reaper, MD;  Location: [facility];  Service: Orthopedics;  Laterality: Left;   thumb surgery Left    Nerve repair   Social History   Occupational History   Not on file  Tobacco Use   Smoking status: Every Day    Current packs/day: 0.00    Average packs/day: 0.3 packs/day for 10.0 years  (2.5 ttl pk-yrs)    Types: Cigarettes, E-cigarettes    Last attempt to quit: 01/25/2019    Years since quitting: 3.8   Smokeless tobacco: Never   Tobacco comments:    stopped in November 2021   Vaping Use   Vaping status: Every Day   Substances: Nicotine  Substance and Sexual Activity   Alcohol use: Yes    Alcohol/week: 5.0 standard drinks of alcohol    Types: 5 Glasses of wine per week    Comment: social   Drug use: No   Sexual activity: Yes    Birth control/protection: I.U.D.

## 2022-12-05 NOTE — Therapy (Signed)
OUTPATIENT OCCUPATIONAL THERAPY TREATMENT NOTE  Patient Name: Susan Boyle MRN: [id number] DOB:[birth year], 32 y.o., female Today's Date: 12/08/2022  PCP: Sundra Aland, MD REFERRING PROVIDER:  Huel Cote, MD    END OF SESSION:  OT End of Session - 12/08/22 0805     Visit Number 4    Number of Visits 10    Date for OT Re-Evaluation 01/09/23    Authorization Type [employer]    OT Start Time [phone]    OT Stop Time [phone]    OT Time Calculation (min) 46 min    Activity Tolerance Patient tolerated treatment well;No increased pain;Patient limited by pain;Patient limited by fatigue    Behavior During Therapy [facility] for tasks assessed/performed;Anxious                Past Medical History:  Diagnosis Date   Allergy    Seasonal. Taking allergy injections   Anxiety    Asthma    Back pain, chronic    Depression    Elevated cholesterol with high triglycerides 11/26/2022   Knee pain, chronic    Past Surgical History:  Procedure Laterality Date   HERNIA REPAIR     inguinal hernia repair as infant   NERVE, TENDON AND ARTERY REPAIR Left 07/20/2013   Procedure: NERVE,  REPAIR;  Surgeon: Nicki Reaper, MD;  Location: [facility];  Service: Orthopedics;  Laterality: Left;   thumb surgery Left    Nerve repair   Patient Active Problem List   Diagnosis Date Noted   Anxiety and depression 11/26/2022   Class 1 obesity due to excess calories with body mass index (BMI) of 31.0 to 31.9 in adult 11/26/2022   Elevated cholesterol with high triglycerides 11/26/2022   Attention deficit hyperactivity disorder (ADHD) 09/17/2022   IUD contraception 09/17/2022   Acute pain of right shoulder 03/22/2021   Seasonal and perennial allergic rhinoconjunctivitis 11/05/2020   Allergic reaction 07/04/2020   Food allergy 05/02/2020   Environmental allergies 05/02/2020   Dermatographism 05/02/2020   Asthma    Lateral epicondylitis of right elbow 09/26/2015   Subacromial  bursitis 08/15/2015    ONSET DATE: July 2024 DOI  REFERRING DIAG: G29.528 (ICD-10-CM) - Pain in right forearm   THERAPY DIAG:  Muscle weakness (generalized)  Paresthesia of skin  Pain in right arm  Stiffness of right wrist, not elsewhere classified  Rationale for Evaluation and Treatment: Rehabilitation  PERTINENT HISTORY: Radial tunnel syndrome program   She states she was in a "pushup" position with 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 She tried a wrist cock-up prefab for awhile, but was advised to stop by other medical provider as her injury isn't a "wrist sprain."  PRECAUTIONS: None  RED FLAGS: None   WEIGHT BEARING RESTRICTIONS: No    SUBJECTIVE:   SUBJECTIVE STATEMENT: She states giving work paperwork to the doctor, trying to adapt work tasks, still having little to no pain, work is still painful at times.    PAIN:  Are you having pain?   Yes: NPRS scale: 1.5/10 at rest now, bu on Saturday, it went up to 6/10 Pain location: Rt arm dorsal radial nerve distribution Pain description: burning,  aching, "lightning" Aggravating factors: motion Relieving factors: rest, ice    PATIENT GOALS: To improve pain levels and increased use of the right dominant hand.  NEXT MD VISIT: 5 weeks    OBJECTIVE: (All objective assessments below are from initial evaluation on: 11/26/22 unless otherwise specified.)   HAND DOMINANCE: Right   ADLs: Overall ADLs: States decreased ability to grab, hold household objects, pain and difficulty to open containers, perform FMS tasks (manipulate fasteners on clothing), mild to moderate bathing problems as well.    FUNCTIONAL OUTCOME MEASURES: Eval: Patient Specific Functional  Scale: 1 /10  (opening zip locks, writing, open tubes)  (Higher Score  =  Better Ability for the Selected Tasks)      UPPER EXTREMITY ROM     Shoulder to Wrist AROM Right eval Rt 12/03/22 Rt 12/05/22 Rt 12/08/22   Forearm supination 65 61  61  Forearm pronation  72 83  83  Wrist flexion 71 47 53 41  Wrist extension 57 37 41 47  (Blank rows = not tested)   Hand AROM Right eval  Full Fist Ability (or Gap to Distal Palmar Crease) full  Thumb Opposition  (Kapandji Scale)  WFL  (Blank rows = not tested)   UPPER EXTREMITY MMT:    Eval:  NT at eval due to high pain, but appears overtly weaker and inhibited in Rt wrist and forearm. Details TBD   MMT Right TBD  Shoulder flexion   Shoulder abduction   Shoulder adduction   Shoulder extension   Shoulder internal rotation   Shoulder external rotation   Middle trapezius   Lower trapezius   Elbow flexion   Elbow extension   Forearm supination   Forearm pronation   Wrist flexion   Wrist extension   Wrist ulnar deviation   Wrist radial deviation   (Blank rows = not tested)  HAND FUNCTION: Eval: Observed weakness in affected Rt hand.  Details when time allows Grip strength Right: TBD lbs, Left: TBD lbs   COORDINATION: 12/08/22: 9 Hole Peg Test Right: 36.5 sec(~20 sec is WFL)   Eval: Observed coordination impairments with affected right hand.  Details when time allows   SENSATION: Eval: Complaints of pain from the arcade approach to the dorsal distal wrist crease, sharp and stabbing at times with supination and wrist extension motions, no paresthesia to tips of fingers  OBSERVATIONS:   Eval: Pain near the arcade of motion the right dorsal forearm, pain with wrist extension or supination typical of radial tunnel syndrome.   TODAY'S TREATMENT:  12/08/22: She starts with active range of motion exercises also to get new measures which does show mild improvements but also some tightness in her arm perhaps from more  immobilization.  This can be necessary to achieve the rest, though she was advised to keep doing gentle exercises and stretches as prescribed at least 3 or 4 times every day.  OT does manual therapy IASTM along the dorsum of her forearm to help mobilize tissues and fascial layers surrounding the radial nerve as well as decreased tightness in wrist flexion.  She tolerates this well today, and she was noted to have some tightness that is contributing through her triceps.  So she lies supine while OT does IASTM through the triceps as well as manual percussion therapy.  She states this is mildly painful but when it is over she has a easier time reaching around her body and moving her elbow without pain.  Generally pain is better after  all treatments today.  Her home exercises including nerve glides were reviewed and performed, and we discussed positive sleep postures.  She lies down to perform several recommended sleep postures with OT supervision and guidance-as she states difficulty sleeping at night and pain through her arm.  She states understanding these things and leaves and little to no pain.       12/05/22: OT discusses this diagnosis and her prognosis with her.  OT suggest that this is a fairly biomechanical problem that should get better every day as long as she is compliant with medical recommendations.  It seems that she has been recently, and now she feels a bit better.  OT encourages her to stay compliant, and if she has an exacerbation of pain to rest more, wear the orthosis more, do pain relief techniques like deep breathing, massage, heat and ice modalities.  She states understanding and seems to have more "body in" today.  Neck she performs active range of motion which does show increased motion now that she is feeling better.  To help with stiffness in her upper back and shoulder blades that could be linked towards nerve compression, OT assigns 3 new exercises as bolded below.  OT helps her  duties safely with her orthosis on which causes no problem and she feels appropriate stretches.  Her other home exercises are reviewed and performed together, also she has no significant pain or problem doing these except for forearm rotation AROM.  These are removed to allow her to rest more and exacerbate less.  She did complain about pain at work and continued need to work hard there-OT suggest speaking with her supervisors and with the physician to try to get more work accommodations if possible.  She leaves in no significant pain stating feeling better and understanding this process better.   Exercises - Standing Shoulder Posterior Capsule Stretch  - 2-3 x daily - 3-5 reps - 15 hold - Standing neck/upper traps stretch  - 4-6 x daily - 3-5 reps - 15 sec hold - Seated Shoulder Blade Squeeze  - 4-6 x daily - 5-10 reps - Tricep Stretch- DO SEATED BY TABLE  - 3-4 x daily - 3 reps - 15 hold - Seated Wrist Extension Stretch  - 3-6 x daily - 3-5 reps - 15 hold - Wrist Flexion Stretch  - 4 x daily - 3-5 reps - 15 sec hold - Standing Radial Nerve Glide  - 3-4 x daily - 3-5 reps    Exercises - Turn Palm Facing Up & Down  - 3-4 x daily - 5-10 reps - Wrist Flexion Stretch  - 4 x daily - 3-5 reps - 15 sec hold - Seated Wrist Extension Stretch  - 3-6 x daily - 3-5 reps - 15 hold - Tricep Stretch- DO SEATED BY TABLE  - 3-4 x daily - 3 reps - 15 hold - Standing Radial Nerve Glide  - 3-4 x daily - 3-5 reps   PATIENT EDUCATION: Education details: See tx section above for details  Person educated: Patient Education method: Verbal Instruction, Teach back, Handouts  Education comprehension: States and demonstrates understanding, Additional Education required    HOME EXERCISE PROGRAM: Access Code: [email or web address] URL: [email or web address] Date: 11/26/2022 Prepared by: Fannie Knee   GOALS: Goals reviewed with patient? Yes   SHORT TERM GOALS: (STG required if POC>30 days) Target  Date: 12/12/22  Pt will obtain protective, custom orthotic. Goal status:  MET at eval  2.  Pt will demo/state understanding  of initial HEP to improve pain levels and prerequisite motion. Goal status: INITIAL   LONG TERM GOALS: Target Date: 01/09/23  Pt will improve functional ability by decreased impairment per PSFS assessment from 1 / 10 to 8/10 or better, for better quality of life. Goal status: INITIAL  2.  Pt will improve grip strength in Rt hand from NT to at least 40lbs for functional use at home and in IADLs. Goal status: INITIAL  3.  Pt will improve A/ROM in Rt wrist ext from 57 to at least 70, to have functional motion for tasks like reach and grasp.  Goal status: INITIAL  4.  Pt will improve strength in Rt wrist ext from NT and painful to at least 4/5 MMT to have increased functional ability to carry out selfcare and higher-level homecare tasks with less difficulty. Goal status: INITIAL  5.  Pt will decrease pain at worst from 7/10 to 3/10 or better to have better sleep and occupational participation in daily roles. Goal status: INITIAL  ASSESSMENT:  CLINICAL IMPRESSION: 12/18/22: Very encouraging that her pain symptoms are still low relatively and she is learning to work on her home exercise program as well as ways to adapt work tasks to do less harm to herself.  Also very positive that she states deciding to speak to a mental health counselor or psychologist about some of the stress in her life that OT feels makes her more prone for accidents and injuries.  Carry on   12/05/22: Pt has had a strange course of therapy so far:  first, non-compliance with therapy recommendations, then "10/10" pain and a trip to the ED yesterday (about 6pm and therapy was 9am.... she left therapy in no significant pain).  OT has been mildly concerned about possible psychosomatic issues and/or possible drug seeking behaviors, as her symptoms are erratic, she has states that she "abused" her  arm, she brought up being adopted and having a stressful home life, etc. OT has recommended her to speak to a psychologist about her stress.  OT also informed and consulted with Dr. Shon Baton who has also treated her.  Fortunately, she is feeling better today and seems to understand these concerns and was recommended to talk to a psychologist about the stress in her life.   PLAN:  OT FREQUENCY: 1-2x/week  OT DURATION: 6 weeks through 01/09/23 as needed up to 10 total visits   PLANNED INTERVENTIONS: self care/ADL training, therapeutic exercise, therapeutic activity, neuromuscular re-education, manual therapy, gait training, splinting, electrical stimulation, ultrasound, iontophoresis, fluidotherapy, compression bandaging, moist heat, cryotherapy, contrast bath, patient/family education, energy conservation, coping strategies training, Re-evaluation, and Dry needling   CONSULTED AND AGREED WITH PLAN OF CARE: Patient  PLAN FOR NEXT SESSION:  Continue to monitor motion, pain, exacerbations and pain relief techniques.  When tolerable she should start weaning from her orthosis and upgrading her stretches and eventually getting into very gentle isometric or eccentric strengthening.  Fannie Knee, OTR/L , CHT 12/08/2022, 6:13 PM

## 2022-12-08 ENCOUNTER — Ambulatory Visit: Payer: 59 | Admitting: Rehabilitative and Restorative Service Providers"

## 2022-12-08 ENCOUNTER — Telehealth: Payer: Self-pay | Admitting: Orthopaedic Surgery

## 2022-12-08 ENCOUNTER — Encounter: Payer: Self-pay | Admitting: Rehabilitative and Restorative Service Providers"

## 2022-12-08 DIAGNOSIS — M25631 Stiffness of right wrist, not elsewhere classified: Secondary | ICD-10-CM | POA: Diagnosis not present

## 2022-12-08 DIAGNOSIS — M79601 Pain in right arm: Secondary | ICD-10-CM

## 2022-12-08 DIAGNOSIS — R202 Paresthesia of skin: Secondary | ICD-10-CM | POA: Diagnosis not present

## 2022-12-08 DIAGNOSIS — M6281 Muscle weakness (generalized): Secondary | ICD-10-CM

## 2022-12-08 NOTE — Telephone Encounter (Signed)
[employer] forms received. To [employer]. (see dictated from Dr. Shon Baton 12/05/22 note to continue same restrictions given by Dr. Steward Drone)

## 2022-12-10 ENCOUNTER — Ambulatory Visit (INDEPENDENT_AMBULATORY_CARE_PROVIDER_SITE_OTHER): Payer: 59 | Admitting: Family Medicine

## 2022-12-10 ENCOUNTER — Other Ambulatory Visit: Payer: Self-pay

## 2022-12-10 ENCOUNTER — Encounter: Payer: Self-pay | Admitting: Family Medicine

## 2022-12-10 VITALS — BP 112/62 | HR 79 | Temp 98.6°F | Ht 63.0 in | Wt 180.0 lb

## 2022-12-10 DIAGNOSIS — R0602 Shortness of breath: Secondary | ICD-10-CM | POA: Diagnosis not present

## 2022-12-10 DIAGNOSIS — Z6831 Body mass index (BMI) 31.0-31.9, adult: Secondary | ICD-10-CM

## 2022-12-10 DIAGNOSIS — K219 Gastro-esophageal reflux disease without esophagitis: Secondary | ICD-10-CM

## 2022-12-10 DIAGNOSIS — R5383 Other fatigue: Secondary | ICD-10-CM

## 2022-12-10 DIAGNOSIS — F419 Anxiety disorder, unspecified: Secondary | ICD-10-CM | POA: Diagnosis not present

## 2022-12-10 DIAGNOSIS — E669 Obesity, unspecified: Secondary | ICD-10-CM | POA: Diagnosis not present

## 2022-12-10 DIAGNOSIS — F908 Attention-deficit hyperactivity disorder, other type: Secondary | ICD-10-CM

## 2022-12-10 DIAGNOSIS — F32A Depression, unspecified: Secondary | ICD-10-CM

## 2022-12-10 DIAGNOSIS — Z1331 Encounter for screening for depression: Secondary | ICD-10-CM

## 2022-12-10 HISTORY — DX: Gastro-esophageal reflux disease without esophagitis: K21.9

## 2022-12-10 MED ORDER — FAMOTIDINE 40 MG PO TABS
40.0000 mg | ORAL_TABLET | Freq: Every day | ORAL | 0 refills | Status: DC
Start: 2022-12-10 — End: 2023-01-06
  Filled 2022-12-11: qty 30, 30d supply, fill #0

## 2022-12-10 NOTE — Therapy (Signed)
OUTPATIENT OCCUPATIONAL THERAPY TREATMENT NOTE  Patient Name: Susan Boyle MRN: [id number] DOB:1991-02-13, 32 y.o., female Today's Date: 12/11/2022  PCP: Sundra Aland, MD REFERRING PROVIDER:  Huel Cote, MD    END OF SESSION:  OT End of Session - 12/11/22 1142     Visit Number 5    Number of Visits 10    Date for OT Re-Evaluation 01/09/23    Authorization Type [employer]    OT Start Time 1143    OT Stop Time 1231    OT Time Calculation (min) 48 min    Activity Tolerance Patient tolerated treatment well;No increased pain;Patient limited by pain;Patient limited by fatigue    Behavior During Therapy [facility] for tasks assessed/performed;Anxious              Past Medical History:  Diagnosis Date   Allergy    Seasonal. Taking allergy injections   Anxiety    Asthma    Back pain, chronic    Depression    Depression screen 12/10/2022   Elevated cholesterol with high triglycerides 11/26/2022   Gastroesophageal reflux disease 12/10/2022   Heartburn    Joint pain    Knee pain, chronic    Shortness of breath    Past Surgical History:  Procedure Laterality Date   HERNIA REPAIR     inguinal hernia repair as infant   NERVE, TENDON AND ARTERY REPAIR Left 07/20/2013   Procedure: NERVE,  REPAIR;  Surgeon: Nicki Reaper, MD;  Location: [facility];  Service: Orthopedics;  Laterality: Left;   thumb surgery Left    Nerve repair   Patient Active Problem List   Diagnosis Date Noted   Other fatigue 12/10/2022   SOBOE (shortness of breath on exertion) 12/10/2022   Gastroesophageal reflux disease 12/10/2022   Depression screen 12/10/2022   BMI 31.0-31.9,adult 12/10/2022   Generalized obesity 12/10/2022   Anxiety and depression 11/26/2022   Class 1 obesity due to excess calories with body mass index (BMI) of 31.0 to 31.9 in adult 11/26/2022   Elevated cholesterol with high triglycerides 11/26/2022   Attention deficit hyperactivity disorder (ADHD)  09/17/2022   IUD contraception 09/17/2022   Acute pain of right shoulder 03/22/2021   Seasonal and perennial allergic rhinoconjunctivitis 11/05/2020   Allergic reaction 07/04/2020   Food allergy 05/02/2020   Environmental allergies 05/02/2020   Dermatographism 05/02/2020   Asthma    Lateral epicondylitis of right elbow 09/26/2015   Subacromial bursitis 08/15/2015    ONSET DATE: July 2024 DOI  REFERRING DIAG: Q65.784 (ICD-10-CM) - Pain in right forearm   THERAPY DIAG:  Muscle weakness (generalized)  Paresthesia of skin  Pain in right arm  Stiffness of right wrist, not elsewhere classified  Rationale for Evaluation and Treatment: Rehabilitation  PERTINENT HISTORY: Radial tunnel syndrome program   She states she was in a "pushup" position with 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 She tried a wrist cock-up prefab for awhile, but was advised to stop by other medical provider as her injury isn't a "wrist sprain."  PRECAUTIONS: None  RED FLAGS: None   WEIGHT BEARING RESTRICTIONS: No    SUBJECTIVE:   SUBJECTIVE STATEMENT: She states scrolling  on her phone yesterday "for hours" and her FA started to hurt, but she recognized it, and worked to stop it and calm it down.    PAIN:  Are you having pain?    Yes: NPRS scale: 0/10 at rest now, bu on Saturday, it went up to 6/10 Pain location: Rt arm dorsal radial nerve distribution Pain description: burning, aching, "lightning" Aggravating factors: motion Relieving factors: rest, ice    PATIENT GOALS: To improve pain levels and increased use of the right dominant hand.  NEXT MD VISIT: 5 weeks    OBJECTIVE: (All objective assessments below are from initial  evaluation on: 11/26/22 unless otherwise specified.)   HAND DOMINANCE: Right   ADLs: Overall ADLs: States decreased ability to grab, hold household objects, pain and difficulty to open containers, perform FMS tasks (manipulate fasteners on clothing), mild to moderate bathing problems as well.    FUNCTIONAL OUTCOME MEASURES: Eval: Patient Specific Functional Scale: 1 /10  (opening zip locks, writing, open tubes)  (Higher Score  =  Better Ability for the Selected Tasks)      UPPER EXTREMITY ROM     Shoulder to Wrist AROM Right eval Rt 12/03/22 Rt 12/05/22 Rt 12/08/22 Rt 12/11/22  Forearm supination 65 61  61 62  Forearm pronation  72 83  83 88  Wrist flexion 71 47 53 41 60  Wrist extension 57 37 41 47 47  (Blank rows = not tested)   Hand AROM Right eval  Full Fist Ability (or Gap to Distal Palmar Crease) full  Thumb Opposition  (Kapandji Scale)  WFL  (Blank rows = not tested)   UPPER EXTREMITY MMT:    Eval:  NT at eval due to high pain, but appears overtly weaker and inhibited in Rt wrist and forearm. Details TBD   MMT Right TBD  Shoulder flexion   Shoulder abduction   Shoulder adduction   Shoulder extension   Shoulder internal rotation   Shoulder external rotation   Middle trapezius   Lower trapezius   Elbow flexion   Elbow extension   Forearm supination   Forearm pronation   Wrist flexion   Wrist extension   Wrist ulnar deviation   Wrist radial deviation   (Blank rows = not tested)  HAND FUNCTION: Eval: Observed weakness in affected Rt hand.  Details when time allows Grip strength Right: TBD lbs, Left: TBD lbs   COORDINATION: 12/08/22: 9 Hole Peg Test Right: 36.5 sec(~20 sec is WFL)   Eval: Observed coordination impairments with affected right hand.  Details when time allows   SENSATION: Eval: Complaints of pain from the arcade approach to the dorsal distal wrist crease, sharp and stabbing at times with supination and wrist extension motions, no  paresthesia to tips of fingers  OBSERVATIONS:   Eval: Pain near the arcade of motion the right dorsal forearm, pain with wrist extension or supination typical of radial tunnel syndrome.   TODAY'S TREATMENT:  12/11/22: Symptoms continue to do better, and we discussed her work and other functional activities, and keeping her stress levels low avoiding provocative postures and watching her habits like scrolling on his cell phone repetitive wrist extension and are prolonged elbow postures.   Next, while she is on moist heat for 3 to 5 minutes OT concurrently reviews her home exercise program with her followed by manual therapy IASTM to the dorsum of her hand, forearm, and tricep area.  OT also performs manual percussion therapy to the tricep area which is tolerated  much better than the other day.  She has little to no pain with this and actually states feeling better by the end.  After this, she performs her exercise program carefully with OT, OT supervising her and encouraging small upgrading of exercises at the forearm and wrist now for various stretches and new or slightly more difficult positions.  Her pain rises to about 1-3 out of 10 but not over a 3 out of 10.  It also quickly resolves when we are done.  She leaves stating feeling better, understanding to keep the status quo but "push" a little bit more with lighter stretches and trying to do her HEP without orthosis on now if tolerated.    12/08/22: She starts with active range of motion exercises also to get new measures which does show mild improvements but also some tightness in her arm perhaps from more immobilization.  This can be necessary to achieve the rest, though she was advised to keep doing gentle exercises and stretches as prescribed at least 3 or 4 times every day.  OT does manual therapy IASTM along the dorsum of her forearm to help mobilize tissues and fascial layers surrounding the radial nerve as well as decreased tightness in  wrist flexion.  She tolerates this well today, and she was noted to have some tightness that is contributing through her triceps.  So she lies supine while OT does IASTM through the triceps as well as manual percussion therapy.  She states this is mildly painful but when it is over she has a easier time reaching around her body and moving her elbow without pain.  Generally pain is better after all treatments today.  Her home exercises including nerve glides were reviewed and performed, and we discussed positive sleep postures.  She lies down to perform several recommended sleep postures with OT supervision and guidance-as she states difficulty sleeping at night and pain through her arm.  She states understanding these things and leaves and little to no pain.      Exercises - Turn [employer] Facing Up & Down  - 3-4 x daily - 5-10 reps - Wrist Flexion Stretch  - 4 x daily - 3-5 reps - 15 sec hold - Seated Wrist Extension Stretch  - 3-6 x daily - 3-5 reps - 15 hold - Tricep Stretch- DO SEATED BY TABLE  - 3-4 x daily - 3 reps - 15 hold - Standing Radial Nerve Glide  - 3-4 x daily - 3-5 reps   PATIENT EDUCATION: Education details: See tx section above for details  Person educated: Patient Education method: Verbal Instruction, Teach back, Handouts  Education comprehension: States and demonstrates understanding, Additional Education required    HOME EXERCISE PROGRAM: Access Code: [email or web address] URL: [email or web address] Date: 11/26/2022 Prepared by: Fannie Knee   GOALS: Goals reviewed with patient? Yes   SHORT TERM GOALS: (STG required if POC>30 days) Target Date: 12/12/22  Pt will obtain protective, custom orthotic. Goal status:  MET at eval  2.  Pt will demo/state understanding of initial HEP to improve pain levels and prerequisite motion. Goal status: INITIAL   LONG TERM GOALS: Target Date: 01/09/23  Pt will improve functional ability by decreased impairment per PSFS  assessment from 1 / 10 to 8/10 or better, for better quality of life. Goal status: INITIAL  2.  Pt will improve grip strength in Rt hand from NT to at least 40lbs for functional use at home and in IADLs. Goal status: INITIAL  3.  Pt will improve A/ROM in Rt wrist ext from 57 to at least 70, to have functional motion for tasks like reach and grasp.  Goal status: INITIAL  4.  Pt will improve strength in Rt wrist ext from NT and painful to at least 4/5 MMT to have increased functional ability to carry out selfcare and higher-level homecare tasks with less difficulty. Goal status: INITIAL  5.  Pt will decrease pain at worst from 7/10 to 3/10 or better to have better sleep and occupational participation in daily roles. Goal status: INITIAL  ASSESSMENT:  CLINICAL IMPRESSION: 12/11/22: She continues to make improvements fortunately.  She is more aware of exacerbations and problems in her own body mechanics.  She should be on track to start very light isometric or even eccentric strengthening to her arm next week but we will be very cautious about any exacerbations.   12/18/22: Very encouraging that her pain symptoms are still low relatively and she is learning to work on her home exercise program as well as ways to adapt work tasks to do less harm to herself.  Also very positive that she states deciding to speak to a mental health counselor or psychologist about some of the stress in her life that OT feels makes her more prone for accidents and injuries.  Carry on   12/05/22: Pt has had a strange course of therapy so far:  first, non-compliance with therapy recommendations, then "10/10" pain and a trip to the ED yesterday (about 6pm and therapy was 9am.... she left therapy in no significant pain).  OT has been mildly concerned about possible psychosomatic issues and/or possible drug seeking behaviors, as her symptoms are erratic, she has states that she "abused" her arm, she brought up being adopted  and having a stressful home life, etc. OT has recommended her to speak to a psychologist about her stress.  OT also informed and consulted with Dr. Shon Baton who has also treated her.  Fortunately, she is feeling better today and seems to understand these concerns and was recommended to talk to a psychologist about the stress in her life.   PLAN:  OT FREQUENCY: 1-2x/week  OT DURATION: 6 weeks through 01/09/23 as needed up to 10 total visits   PLANNED INTERVENTIONS: self care/ADL training, therapeutic exercise, therapeutic activity, neuromuscular re-education, manual therapy, gait training, splinting, electrical stimulation, ultrasound, iontophoresis, fluidotherapy, compression bandaging, moist heat, cryotherapy, contrast bath, patient/family education, energy conservation, coping strategies training, Re-evaluation, and Dry needling   CONSULTED AND AGREED WITH PLAN OF CARE: Patient  PLAN FOR NEXT SESSION:    Continue to monitor motion, pain, exacerbations and pain relief techniques.  When tolerable she should start weaning from her orthosis and upgrading her stretches and eventually getting into very gentle isometric or eccentric strengthening.  Fannie Knee, OTR/L , CHT 12/11/2022, 12:39 PM

## 2022-12-11 ENCOUNTER — Other Ambulatory Visit (HOSPITAL_COMMUNITY): Payer: Self-pay

## 2022-12-11 ENCOUNTER — Other Ambulatory Visit: Payer: Self-pay | Admitting: Family

## 2022-12-11 ENCOUNTER — Ambulatory Visit: Payer: 59 | Admitting: Rehabilitative and Restorative Service Providers"

## 2022-12-11 ENCOUNTER — Encounter: Payer: Self-pay | Admitting: Rehabilitative and Restorative Service Providers"

## 2022-12-11 DIAGNOSIS — M79601 Pain in right arm: Secondary | ICD-10-CM | POA: Diagnosis not present

## 2022-12-11 DIAGNOSIS — M25631 Stiffness of right wrist, not elsewhere classified: Secondary | ICD-10-CM | POA: Diagnosis not present

## 2022-12-11 DIAGNOSIS — M6281 Muscle weakness (generalized): Secondary | ICD-10-CM | POA: Diagnosis not present

## 2022-12-11 DIAGNOSIS — R202 Paresthesia of skin: Secondary | ICD-10-CM | POA: Diagnosis not present

## 2022-12-11 NOTE — Progress Notes (Signed)
Chief Complaint:   OBESITY Susan Boyle (MR# [id number]) is a 32 y.o. female who presents for evaluation and treatment of obesity and related comorbidities. Current BMI is Body mass index is 31.89 kg/m. Susan Boyle has been struggling with her weight for many years and has been unsuccessful in either losing weight, maintaining weight loss, or reaching her healthy weight goal.  Susan Boyle is currently in the action stage of change and ready to dedicate time achieving and maintaining a healthier weight. Susan Boyle is interested in becoming our patient and working on intensive lifestyle modifications including (but not limited to) diet and exercise for weight loss.  Patient is a Associate Professor at [employer].  She lives with her husband and she is a nulligravida.  She works out 2 times a week and averages 5,000 steps per day.  She eats out daily, she is a Dispensing optician, and she skips breakfast, and also skips meals due to Vyvanse.  She is a stress and boredom eater.  Susan Boyle's habits were reviewed today and are as follows: Her family eats meals together, she thinks her family will eat healthier with her, her desired weight loss is 60 lbs, she started gaining excessive weight 5 years ago, her heaviest weight ever was 185 pounds, she has significant food cravings issues, she snacks frequently in the evenings, she skips meals frequently, she is frequently drinking liquids with calories, she frequently makes poor food choices, she has problems with excessive hunger, she frequently eats larger portions than normal, and she struggles with emotional eating.  Depression Screen Susan Boyle's Food and Mood (modified PHQ-9) score was 21.  Subjective:   1. Other fatigue Susan Boyle admits to daytime somnolence and admits to both waking up refreshed and waking up still tired. Patient has a history of symptoms of daytime fatigue and morning fatigue. Susan Boyle generally gets 5 or 8 hours of sleep per night, and states that  she has generally restful sleep. Snoring is present. Apneic episodes are not present. Epworth Sleepiness Score is 6.  EKG: Normal sinus rhythm at 83 bpm without ischemia.  2. SOBOE (shortness of breath on exertion) Susan Boyle notes increasing shortness of breath with exercising and seems to be worsening over time with weight gain. She notes getting out of breath sooner with activity than she used to. This has not gotten worse recently. Susan Boyle denies shortness of breath at rest or orthopnea.  3. Other specified attention deficit hyperactivity disorder (ADHD) Patient is on 60 mg every morning.  She notes Vyvanse causes anorexia.  4. Gastroesophageal reflux disease, unspecified whether esophagitis present Patient notes her symptoms are worsening.  She takes Tums as needed.  She has heartburn daily with a lot of burping.  She has episodes of epigastric pain.  She denies melena or NSAID use.  5. Anxiety and depression Patient is on bupropion 150 mg every morning, Paxil 20 mg nightly, and trazodone 50 mg nightly as needed.  Her bariatric PHQ-9 score is 21.  Her mood is improving and she has good support at home.  She has a hard time sleeping on workdays.  Paxil can contribute to weight gain.  Assessment/Plan:   1. Other fatigue Susan Boyle does feel that her weight is causing her energy to be lower than it should be. Fatigue may be related to obesity, depression or many other causes. Labs will be ordered, and in the meanwhile, Susan Boyle will focus on self care including making healthy food choices, increasing physical activity and focusing on  stress reduction.  - EKG 12-Lead - VITAMIN D 25 Hydroxy (Vit-D Deficiency, Fractures) - TSH - T4, free - T3 - Lipid panel - Insulin, random - Hemoglobin A1c - Folate - Comprehensive metabolic panel - Vitamin B12 - CBC with Differential/Platelet - Ferritin  2. SOBOE (shortness of breath on exertion) Susan Boyle does feel that she gets out of breath more easily that  she used to when she exercises. Susan Boyle's shortness of breath appears to be obesity related and exercise induced. She has agreed to work on weight loss and gradually increase exercise to treat her exercise induced shortness of breath. Will continue to monitor closely.  3. Other specified attention deficit hyperactivity disorder (ADHD) Patient is to eat breakfast before taking Vyvanse in the morning, and do not skip lunch.  4. Gastroesophageal reflux disease, unspecified whether esophagitis present Patient will begin famotidine 40 mg nightly with no refills.  She is to avoid eating within 2 hours of bedtime.  - famotidine (PEPCID) 40 MG tablet; Take 1 tablet (40 mg total) by mouth at bedtime.  Dispense: 30 tablet; Refill: 0  5. Anxiety and depression Referral to Dr. Dewaine Conger, our bariatric psychologist was made for cognitive behavioral therapy.  Patient will work on stress reduction.  6. Depression screen Susan Boyle had a positive depression screening. Depression is commonly associated with obesity and often results in emotional eating behaviors. We will monitor this closely and work on CBT to help improve the non-hunger eating patterns. Referral to Psychology may be required if no improvement is seen as she continues in our clinic.  7. BMI 31.0-31.9,adult  8. Generalized obesity with starting BMI 31.9 Susan Boyle is currently in the action stage of change and her goal is to continue with weight loss efforts. I recommend Susan Boyle begin the structured treatment plan as follows:  She has agreed to the Category 2 Plan.  100-calorie snack list was given. She can opt for black coffee and protein shake and one serving of fruit for breakfast.   Exercise goals: All adults should avoid inactivity. Some physical activity is better than none, and adults who participate in any amount of physical activity gain some health benefits.   Behavioral modification strategies: increasing lean protein intake, increasing  vegetables, increasing water intake, decreasing liquid calories, decreasing alcohol intake, decreasing eating out, meal planning and cooking strategies, keeping healthy foods in the home, better snacking choices, planning for success, and decreasing junk food.  She was informed of the importance of frequent follow-up visits to maximize her success with intensive lifestyle modifications for her multiple health conditions. She was informed we would discuss her lab results at her next visit unless there is a critical issue that needs to be addressed sooner. Amali agreed to keep her next visit at the agreed upon time to discuss these results.  Objective:   Blood pressure 112/62, pulse 79, temperature 98.6 F (37 C), height 5\' 3"  (1.6 m), weight 180 lb (81.6 kg), SpO2 99%. Body mass index is 31.89 kg/m.  EKG: Normal sinus rhythm, rate 83 BPM.  Indirect Calorimeter completed today shows a VO2 of 233 and a REE of 1613.  Her calculated basal metabolic rate is 4782 thus her basal metabolic rate is better than expected.  General: Cooperative, alert, well developed, in no acute distress. HEENT: Conjunctivae and lids unremarkable. Cardiovascular: Regular rhythm.  Lungs: Normal work of breathing. Neurologic: No focal deficits.   Lab Results  Component Value Date   CREATININE 0.67 09/17/2022   BUN 11 09/17/2022  NA 140 09/17/2022   K 4.2 09/17/2022   CL 103 09/17/2022   CO2 23 09/17/2022   Lab Results  Component Value Date   ALT 17 09/17/2022   AST 17 09/17/2022   ALKPHOS 47 09/17/2022   BILITOT 0.4 09/17/2022   Lab Results  Component Value Date   HGBA1C 5.2 09/17/2022   No results found for: "INSULIN" Lab Results  Component Value Date   TSH 2.130 05/02/2020   Lab Results  Component Value Date   CHOL 196 09/17/2022   HDL 61 09/17/2022   LDLCALC 106 (H) 09/17/2022   TRIG 167 (H) 09/17/2022   CHOLHDL 3.2 09/17/2022   Lab Results  Component Value Date   WBC 6.0 09/17/2022    HGB 13.5 09/17/2022   HCT 40.1 09/17/2022   MCV 90 09/17/2022   PLT 246 09/17/2022   No results found for: "IRON", "TIBC", "FERRITIN"  Attestation Statements:   Reviewed by clinician on day of visit: allergies, medications, problem list, medical history, surgical history, family history, social history, and previous encounter notes.  Time spent on visit including pre-visit chart review and post-visit charting and care was 40 minutes.   Trude Mcburney, am acting as transcriptionist for Seymour Bars, DO.  I have reviewed the above documentation for accuracy and completeness, and I agree with the above. Seymour Bars, DO

## 2022-12-12 ENCOUNTER — Other Ambulatory Visit (HOSPITAL_COMMUNITY): Payer: Self-pay

## 2022-12-12 ENCOUNTER — Encounter (HOSPITAL_COMMUNITY): Payer: Self-pay

## 2022-12-12 LAB — VITAMIN B12: Vitamin B-12: 344 pg/mL (ref 232–1245)

## 2022-12-12 LAB — FOLATE: Folate: 10.9 ng/mL (ref >3.0)

## 2022-12-12 LAB — CBC WITH DIFFERENTIAL/PLATELET
Basophils Absolute: 0 10*3/uL (ref 0.0–0.2)
Basos: 0 %
EOS (ABSOLUTE): 0.1 10*3/uL (ref 0.0–0.4)
Eos: 3 %
Hematocrit: 39.4 % (ref 34.0–46.6)
Hemoglobin: 12.7 g/dL (ref 11.1–15.9)
Immature Grans (Abs): 0 10*3/uL (ref 0.0–0.1)
Immature Granulocytes: 0 %
Lymphocytes Absolute: 1.9 10*3/uL (ref 0.7–3.1)
Lymphs: 36 %
MCH: 30.1 pg (ref 26.6–33.0)
MCHC: 32.2 g/dL (ref 31.5–35.7)
MCV: 93 fL (ref 79–97)
Monocytes Absolute: 0.5 10*3/uL (ref 0.1–0.9)
Monocytes: 9 %
Neutrophils Absolute: 2.8 10*3/uL (ref 1.4–7.0)
Neutrophils: 52 %
Platelets: 205 10*3/uL (ref 150–450)
RBC: 4.22 x10E6/uL (ref 3.77–5.28)
RDW: 12.7 % (ref 11.7–15.4)
WBC: 5.3 10*3/uL (ref 3.4–10.8)

## 2022-12-12 LAB — COMPREHENSIVE METABOLIC PANEL
ALT: 18 [IU]/L (ref 0–32)
AST: 16 [IU]/L (ref 0–40)
Albumin: 4.9 g/dL (ref 3.9–4.9)
Alkaline Phosphatase: 51 [IU]/L (ref 44–121)
BUN/Creatinine Ratio: 21 (ref 9–23)
BUN: 17 mg/dL (ref 6–20)
Bilirubin Total: 0.4 mg/dL (ref 0.0–1.2)
CO2: 22 mmol/L (ref 20–29)
Calcium: 9.5 mg/dL (ref 8.7–10.2)
Chloride: 101 mmol/L (ref 96–106)
Creatinine, Ser: 0.81 mg/dL (ref 0.57–1.00)
Globulin, Total: 2.3 g/dL (ref 1.5–4.5)
Glucose: 91 mg/dL (ref 70–99)
Potassium: 4.4 mmol/L (ref 3.5–5.2)
Sodium: 138 mmol/L (ref 134–144)
Total Protein: 7.2 g/dL (ref 6.0–8.5)
eGFR: 99 mL/min/{1.73_m2} (ref >59)

## 2022-12-12 LAB — LIPID PANEL
Chol/HDL Ratio: 2.6 {ratio} (ref 0.0–4.4)
Cholesterol, Total: 190 mg/dL (ref 100–199)
HDL: 72 mg/dL (ref >39)
LDL Chol Calc (NIH): 107 mg/dL — ABNORMAL HIGH (ref 0–99)
Triglycerides: 57 mg/dL (ref 0–149)
VLDL Cholesterol Cal: 11 mg/dL (ref 5–40)

## 2022-12-12 LAB — HEMOGLOBIN A1C
Est. average glucose Bld gHb Est-mCnc: 100 mg/dL
Hgb A1c MFr Bld: 5.1 % (ref 4.8–5.6)

## 2022-12-12 LAB — VITAMIN D 25 HYDROXY (VIT D DEFICIENCY, FRACTURES): Vit D, 25-Hydroxy: 13.4 ng/mL — ABNORMAL LOW (ref 30.0–100.0)

## 2022-12-12 LAB — FERRITIN: Ferritin: 108 ng/mL (ref 15–150)

## 2022-12-12 LAB — T4, FREE: Free T4: 1.23 ng/dL (ref 0.82–1.77)

## 2022-12-12 LAB — T3: T3, Total: 126 ng/dL (ref 71–180)

## 2022-12-12 LAB — INSULIN, RANDOM: INSULIN: 14.1 u[IU]/mL (ref 2.6–24.9)

## 2022-12-12 LAB — TSH: TSH: 8.11 u[IU]/mL — ABNORMAL HIGH (ref 0.450–4.500)

## 2022-12-16 ENCOUNTER — Other Ambulatory Visit (HOSPITAL_COMMUNITY): Payer: Self-pay

## 2022-12-17 ENCOUNTER — Encounter: Payer: 59 | Admitting: Rehabilitative and Restorative Service Providers"

## 2022-12-17 NOTE — Therapy (Signed)
OUTPATIENT OCCUPATIONAL THERAPY TREATMENT NOTE  Patient Name: Susan Boyle MRN: [id number] DOB:1990/08/02, 32 y.o., female Today's Date: 12/19/2022  PCP: Sundra Aland, MD REFERRING PROVIDER:  Huel Cote, MD    END OF SESSION:  OT End of Session - 12/19/22 0853     Visit Number 6    Number of Visits 10    Date for OT Re-Evaluation 01/09/23    Authorization Type [employer]    OT Start Time [id number]    OT Stop Time 0932    OT Time Calculation (min) 39 min    Equipment Utilized During Treatment [id number]    Activity Tolerance Patient tolerated treatment well;No increased pain;Patient limited by pain;Patient limited by fatigue    Behavior During Therapy [facility] for tasks assessed/performed;Anxious               Past Medical History:  Diagnosis Date   Allergy    Seasonal. Taking allergy injections   Anxiety    Asthma    Back pain, chronic    Depression    Depression screen 12/10/2022   Elevated cholesterol with high triglycerides 11/26/2022   Gastroesophageal reflux disease 12/10/2022   Heartburn    Joint pain    Knee pain, chronic    Shortness of breath    Past Surgical History:  Procedure Laterality Date   HERNIA REPAIR     inguinal hernia repair as infant   NERVE, TENDON AND ARTERY REPAIR Left 07/20/2013   Procedure: NERVE,  REPAIR;  Surgeon: Nicki Reaper, MD;  Location:  [facility];  Service: Orthopedics;  Laterality: Left;   thumb surgery Left    Nerve repair   Patient Active Problem List   Diagnosis Date Noted   Other fatigue 12/10/2022   SOBOE (shortness of breath on exertion) 12/10/2022   Gastroesophageal reflux disease 12/10/2022   Depression screen 12/10/2022   BMI 31.0-31.9,adult 12/10/2022   Generalized obesity 12/10/2022   Anxiety and depression 11/26/2022   Class 1 obesity due to excess calories with body mass index (BMI) of 31.0 to 31.9 in adult 11/26/2022   Elevated cholesterol with high triglycerides 11/26/2022    Attention deficit hyperactivity disorder (ADHD) 09/17/2022   IUD contraception 09/17/2022   Acute pain of right shoulder 03/22/2021   Seasonal and perennial allergic rhinoconjunctivitis 11/05/2020   Allergic reaction 07/04/2020   Food allergy 05/02/2020   Environmental allergies 05/02/2020   Dermatographism 05/02/2020   Asthma    Lateral epicondylitis of right elbow 09/26/2015   Subacromial bursitis 08/15/2015    ONSET DATE: July 2024 DOI  REFERRING DIAG: X32.440 (ICD-10-CM) - Pain in right forearm   THERAPY DIAG:  Muscle weakness (generalized)  Paresthesia of skin  Pain in right arm  Stiffness of right wrist, not elsewhere classified  Rationale for Evaluation and Treatment: Rehabilitation  PERTINENT HISTORY: Radial tunnel syndrome program   She states she was in a "pushup" position with 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 She tried a wrist cock-up prefab for awhile, but was advised to stop by other medical provider as her injury isn't a "wrist sprain."  PRECAUTIONS: None  RED FLAGS: None   WEIGHT BEARING RESTRICTIONS: No  SUBJECTIVE:   SUBJECTIVE STATEMENT: She states doing well, catching herself if she is hurting, wearing brace, etc. Work has been relatively non-painful.     PAIN:  Are you having pain?    Yes: NPRS scale: 0.5/10 at rest now, bu on Saturday, it went up to 6/10 Pain location: Rt arm dorsal radial nerve distribution Pain description: burning, aching, "lightning" Aggravating factors: motion Relieving factors: rest, ice    PATIENT GOALS: To improve pain levels and increased use of the right dominant hand.  NEXT MD VISIT: 5 weeks    OBJECTIVE: (All objective assessments below are  from initial evaluation on: 11/26/22 unless otherwise specified.)   HAND DOMINANCE: Right   ADLs: Overall ADLs: States decreased ability to grab, hold household objects, pain and difficulty to open containers, perform FMS tasks (manipulate fasteners on clothing), mild to moderate bathing problems as well.    FUNCTIONAL OUTCOME MEASURES: Eval: Patient Specific Functional Scale: 1 /10  (opening zip locks, writing, open tubes)  (Higher Score  =  Better Ability for the Selected Tasks)      UPPER EXTREMITY ROM     Shoulder to Wrist AROM Right eval Rt 12/03/22 Rt 12/05/22 Rt 12/08/22 Rt 12/11/22 Rt 12/19/22  Forearm supination 65 61  61 62 85  Forearm pronation  72 83  83 88 63  Wrist flexion 71 47 53 41 60 85  Wrist extension 57 37 41 47 47 62  (Blank rows = not tested)   Hand AROM Right eval  Full Fist Ability (or Gap to Distal Palmar Crease) full  Thumb Opposition  (Kapandji Scale)  WFL  (Blank rows = not tested)   UPPER EXTREMITY MMT:     MMT Right 12/19/22  Shoulder flexion   Shoulder abduction   Shoulder adduction   Shoulder extension   Shoulder internal rotation   Shoulder external rotation   Middle trapezius   Lower trapezius   Elbow flexion   Elbow extension   Forearm supination   Forearm pronation   Wrist flexion 4/5 tender  Wrist extension 4-/5 more tender  Wrist ulnar deviation   Wrist radial deviation   (Blank rows = not tested)  HAND FUNCTION: 12/19/22: Grip strength Right:  23 lbs, Left: 68 lbs   Eval: Observed weakness in affected Rt hand.  Details when time allows   COORDINATION: 12/08/22: 9 Hole Peg Test Right: 36.5 sec(~20 sec is WFL)   Eval: Observed coordination impairments with affected right hand.  Details when time allows   SENSATION: Eval: Complaints of pain from the arcade approach to the dorsal distal wrist crease, sharp and stabbing at times with supination and wrist extension motions, no paresthesia to tips of  fingers  OBSERVATIONS:   Eval: Pain near the arcade of motion the right dorsal forearm, pain with wrist extension or supination typical of radial tunnel syndrome.   TODAY'S TREATMENT:  12/19/22: She starts with active range of motion for exercises as well as new measures today which shows excellent improvements in wrist range of motion.  She also has less tenderness to touch and tolerates light soft tissue mobilization with no tenderness or pain directly over the lateral epicondyle and radial nerve area.  To help give her support when not wearing orthosis, OT performs manual therapy kinesiotaping to help support the wrist extensors as well as help prevent over extension at the tricep.  She states this is helpful and bracing her somewhat.  She does a quick review of her current home exercise  program including stretching at the wrist which she tolerates larger stretches now.  When she is educated on new exercises for very light strengthening isometrically at the wrist in flexion and extension and also isometric grip training as bolded below.  OT demonstrates these and explains not to do these things painfully and to go very cautiously only once or twice a day, stopping if these become painful.  She needs to avoid an exacerbation at all costs.  She performs them back with no significant increase in pain or problem but feeling appropriate tension.  She leaves in no significant pain.    Exercises - Standing Shoulder Posterior Capsule Stretch  - 2-3 x daily - 3-5 reps - 15 hold - Standing neck/upper traps stretch  - 4-6 x daily - 3-5 reps - 15 sec hold - Seated Shoulder Blade Squeeze  - 4-6 x daily - 5-10 reps - Tricep Stretch- DO SEATED BY TABLE  - 3-4 x daily - 3 reps - 15 hold - Seated Wrist Extension Stretch  - 3-6 x daily - 3-5 reps - 15 hold - Wrist Flexion Stretch  - 4 x daily - 3-5 reps - 15 sec hold - Standing Radial Nerve Glide  - 3-4 x daily - 3-5 reps - Towel Roll Grip with Forearm in Neutral   - 3 x daily - 5 reps - 10 sec hold - Seated Isometric Wrist Flexion Supinated with Manual Resistance  - 4-6 x daily - 1 sets - 10-15 reps - Isometric Wrist Extension Pronated  - 2-3 x daily - 4-5 x weekly - 5 reps - 5-10 seconds hold    PATIENT EDUCATION: Education details: See tx section above for details  Person educated: Patient Education method: Engineer, structural, Teach back, Handouts  Education comprehension: States and demonstrates understanding, Additional Education required    HOME EXERCISE PROGRAM: Access Code: [email or web address] URL: [email or web address] Date: 11/26/2022 Prepared by: Fannie Knee   GOALS: Goals reviewed with patient? Yes   SHORT TERM GOALS: (STG required if POC>30 days) Target Date: 12/12/22  Pt will obtain protective, custom orthotic. Goal status:  MET at eval  2.  Pt will demo/state understanding of initial HEP to improve pain levels and prerequisite motion. Goal status: INITIAL   LONG TERM GOALS: Target Date: 01/09/23  Pt will improve functional ability by decreased impairment per PSFS assessment from 1 / 10 to 8/10 or better, for better quality of life. Goal status: INITIAL  2.  Pt will improve grip strength in Rt hand from NT to at least 40lbs for functional use at home and in IADLs. Goal status: INITIAL  3.  Pt will improve A/ROM in Rt wrist ext from 57 to at least 70, to have functional motion for tasks like reach and grasp.  Goal status: INITIAL  4.  Pt will improve strength in Rt wrist ext from NT and painful to at least 4/5 MMT to have increased functional ability to carry out selfcare and higher-level homecare tasks with less difficulty. Goal status: INITIAL  5.  Pt will decrease pain at worst from 7/10 to 3/10 or better to have better sleep and occupational participation in daily roles. Goal status: INITIAL  ASSESSMENT:  CLINICAL IMPRESSION: 12/19/22: She is really turned the corner and now tolerating light  isometric strengthening of grip and also at the wrist.  She should continue these very lightly and not aggressively till next week and if she still doing well she will learn light eccentric strengthening and will also try  to upgrade her stretches to be a bit more vigorous.  She should avoid exacerbation at all costs and go right back to immobilization in her orthosis as needed.   PLAN:  OT FREQUENCY: 1-2x/week  OT DURATION: 6 weeks through 01/09/23 as needed up to 10 total visits   PLANNED INTERVENTIONS: self care/ADL training, therapeutic exercise, therapeutic activity, neuromuscular re-education, manual therapy, gait training, splinting, electrical stimulation, ultrasound, iontophoresis, fluidotherapy, compression bandaging, moist heat, cryotherapy, contrast bath, patient/family education, energy conservation, coping strategies training, Re-evaluation, and Dry needling   CONSULTED AND AGREED WITH PLAN OF CARE: Patient  PLAN FOR NEXT SESSION:   Check on new isometric strengthening and upgrade to light eccentric strengthening as tolerated.  Manual therapy and modalities as helpful.  Fannie Knee, OTR/L , CHT 12/19/2022, 9:39 AM

## 2022-12-18 NOTE — Therapy (Signed)
OUTPATIENT OCCUPATIONAL THERAPY TREATMENT NOTE  Patient Name: Susan Boyle MRN: [id number] DOB:12-16-1990, 32 y.o., female Today's Date: 12/22/2022  PCP: Sundra Aland, MD REFERRING PROVIDER:  Huel Cote, MD    END OF SESSION:  OT End of Session - 12/22/22 0843     Visit Number 7    Number of Visits 10    Date for OT Re-Evaluation 01/09/23    Authorization Type [employer]    OT Start Time [phone]    OT Stop Time 0940    OT Time Calculation (min) 57 min    Activity Tolerance Patient tolerated treatment well;No increased pain;Patient limited by pain;Patient limited by fatigue    Behavior During Therapy [facility] for tasks assessed/performed;Anxious                Past Medical History:  Diagnosis Date   Allergy    Seasonal. Taking allergy injections   Anxiety    Asthma    Back pain, chronic    Depression    Depression screen 12/10/2022   Elevated cholesterol with high triglycerides 11/26/2022   Gastroesophageal reflux disease 12/10/2022   Heartburn    Joint pain    Knee pain, chronic    Shortness of breath    Past Surgical History:  Procedure Laterality Date   HERNIA REPAIR     inguinal hernia repair as infant   NERVE, TENDON AND ARTERY REPAIR Left 07/20/2013   Procedure: NERVE,  REPAIR;  Surgeon: Nicki Reaper, MD;  Location: [facility];  Service: Orthopedics;  Laterality: Left;   thumb surgery Left    Nerve repair   Patient Active Problem List   Diagnosis Date Noted   Other fatigue 12/10/2022   SOBOE (shortness of breath on exertion) 12/10/2022   Gastroesophageal reflux disease 12/10/2022   Depression screen 12/10/2022   BMI 31.0-31.9,adult 12/10/2022   Generalized obesity 12/10/2022   Anxiety and depression 11/26/2022   Class 1 obesity due to excess calories with body mass index (BMI) of 31.0 to 31.9 in adult 11/26/2022   Elevated cholesterol with high triglycerides 11/26/2022   Attention deficit hyperactivity disorder  (ADHD) 09/17/2022   IUD contraception 09/17/2022   Acute pain of right shoulder 03/22/2021   Seasonal and perennial allergic rhinoconjunctivitis 11/05/2020   Allergic reaction 07/04/2020   Food allergy 05/02/2020   Environmental allergies 05/02/2020   Dermatographism 05/02/2020   Asthma    Lateral epicondylitis of right elbow 09/26/2015   Subacromial bursitis 08/15/2015    ONSET DATE: July 2024 DOI  REFERRING DIAG: J19.147 (ICD-10-CM) - Pain in right forearm   THERAPY DIAG:  Muscle weakness (generalized)  Paresthesia of skin  Pain in right arm  Stiffness of right wrist, not elsewhere classified  Rationale for Evaluation and Treatment: Rehabilitation  PERTINENT HISTORY: Radial tunnel syndrome program   She states she was in a "pushup" position with 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 She tried a wrist cock-up prefab for awhile, but was advised to stop by other medical provider as her injury isn't a "wrist sprain."  PRECAUTIONS: None  RED FLAGS: None   WEIGHT BEARING RESTRICTIONS: No    SUBJECTIVE:   SUBJECTIVE STATEMENT: She  states trying to sleep without brace, doing more activities at work now and only mild pain, and no significant problems. Strengthening has gone pretty well with no exacerbation.      PAIN:  Are you having pain?    Yes: NPRS scale: 1.5/10 at rest now, bu on Saturday, it went up to 6/10 Pain location: Rt arm dorsal radial nerve distribution Pain description: burning, aching, "lightning" Aggravating factors: motion Relieving factors: rest, ice    PATIENT GOALS: To improve pain levels and increased use of the right dominant hand.  NEXT MD VISIT: 5 weeks    OBJECTIVE: (All  objective assessments below are from initial evaluation on: 11/26/22 unless otherwise specified.)   HAND DOMINANCE: Right   ADLs: Overall ADLs: States decreased ability to grab, hold household objects, pain and difficulty to open containers, perform FMS tasks (manipulate fasteners on clothing), mild to moderate bathing problems as well.    FUNCTIONAL OUTCOME MEASURES: Eval: Patient Specific Functional Scale: 1 /10  (opening zip locks, writing, open tubes)  (Higher Score  =  Better Ability for the Selected Tasks)      UPPER EXTREMITY ROM     Shoulder to Wrist AROM Right eval Rt 12/03/22 Rt 12/05/22 Rt 12/08/22 Rt 12/11/22 Rt 12/19/22 Rt  12/22/22  Forearm supination 65 61  61 62 85 60  Forearm pronation  72 83  83 88 63 81  Wrist flexion 71 47 53 41 60 85 62 (75* after DN)  Wrist extension 57 37 41 47 47 62 55  (Blank rows = not tested)   Hand AROM Right eval  Full Fist Ability (or Gap to Distal Palmar Crease) full  Thumb Opposition  (Kapandji Scale)  WFL  (Blank rows = not tested)   UPPER EXTREMITY MMT:     MMT Right 12/19/22  Shoulder flexion   Shoulder abduction   Shoulder adduction   Shoulder extension   Shoulder internal rotation   Shoulder external rotation   Middle trapezius   Lower trapezius   Elbow flexion   Elbow extension   Forearm supination   Forearm pronation   Wrist flexion 4/5 tender  Wrist extension 4-/5 more tender  Wrist ulnar deviation   Wrist radial deviation   (Blank rows = not tested)  HAND FUNCTION: 12/24/22: Grip Rt: 56#  12/19/22: Grip strength Right:  23 lbs, Left: 68 lbs   Eval: Observed weakness in affected Rt hand.  Details when time allows   COORDINATION: 12/08/22: 9 Hole Peg Test Right: 36.5 sec(~20 sec is WFL)   Eval: Observed coordination impairments with affected right hand.  Details when time allows   SENSATION: Eval: Complaints of pain from the arcade approach to the dorsal distal wrist crease, sharp and  stabbing at times with supination and wrist extension motions, no paresthesia to tips of fingers  OBSERVATIONS:   Eval: Pain near the arcade of motion the right dorsal forearm, pain with wrist extension or supination typical of radial tunnel syndrome.   TODAY'S TREATMENT:  12/22/22: She performs active range of motion for exercise as well as new measures which does show some stiffness due to increased pain from recent work activities.  Is relatively minor change though.  OT puts her on moist heat for 5 minutes while updating and upgrading her home exercise program and explaining new eccentric strengthening exercises.  She states that heat feels good and helps her feel looser at the end.  OT then does manual therapy IASTM to the dorsal forearm and  sore areas and she states more muscle spasm/soreness today versus sharp or acute pain.  Dry needling would be an excellent modality for this, so OT asks her to perform manual therapy dry needling modality after giving her a rundown of the benefits and possible side effects.  She agrees to this and OT uses a 0.73mm x 30mm needle inserted only partly into her extensor carpi radialis brevis, achieving an excellent twitch response and no significant pain, bleeding, bruising.  Instantly she feels a better, and her motion improves as listed above above.  Next, OT performs manual therapy kinesiotaping as she states this did help to support her after the last session, and then she does her home exercise stretches for review and practice as well as new grip training and a review isometric training.  Lastly she performs the following exercises with therapy bands and a 3 pound weight for the eccentric exercise.  She tolerates these well without any new soreness but is told to use extreme caution to prevent exacerbations at home.  She asks OT to quickly adjust her orthosis at the end of the session as it has some padding coming off and she would like it replaced.  She leaves  feeling good   Exercises - Standing Shoulder Posterior Capsule Stretch  - 2-3 x daily - 3-5 reps - 15 hold - Standing neck/upper traps stretch  - 4-6 x daily - 3-5 reps - 15 sec hold - Seated Shoulder Blade Squeeze  - 4-6 x daily - 5-10 reps - Tricep Stretch- DO SEATED BY TABLE  - 3-4 x daily - 3 reps - 15 hold - Seated Wrist Extension Stretch  - 3-6 x daily - 3-5 reps - 15 hold - Wrist Flexion Stretch  - 4 x daily - 3-5 reps - 15 sec hold - Standing Radial Nerve Glide  - 3-4 x daily - 3-5 reps - Towel Roll Grip with Forearm in Neutral  - 3 x daily - 5 reps - 10 sec hold - Seated Eccentric Wrist Extension  - 1-3 x daily - 1-2 sets - 5-10 reps - 10 sec hold - Standing Elbow Extension with Anchored Resistance  - 1-3 x daily - 1-2 sets - 5-10 reps - Standing Bicep Curls with Resistance  - 1-3 x daily - 1-2 sets - 5-10 reps     PATIENT EDUCATION: Education details: See tx section above for details  Person educated: Patient Education method: Verbal Instruction, Teach back, Handouts  Education comprehension: States and demonstrates understanding, Additional Education required    HOME EXERCISE PROGRAM: Access Code: [email or web address] URL: [email or web address] Date: 11/26/2022 Prepared by: Fannie Knee   GOALS: Goals reviewed with patient? Yes   SHORT TERM GOALS: (STG required if POC>30 days) Target Date: 12/12/22  Pt will obtain protective, custom orthotic. Goal status:  MET at eval  2.  Pt will demo/state understanding of initial HEP to improve pain levels and prerequisite motion. Goal status: INITIAL   LONG TERM GOALS: Target Date: 01/09/23  Pt will improve functional ability by decreased impairment per PSFS assessment from 1 / 10 to 8/10 or better, for better quality of life. Goal status: INITIAL  2.  Pt will improve grip strength in Rt hand from NT to at least 40lbs for functional use at home and in IADLs. Goal status: INITIAL  3.  Pt will improve A/ROM in  Rt wrist ext from 57 to at least 70, to have functional motion for tasks like reach and grasp.  Goal status:  INITIAL  4.  Pt will improve strength in Rt wrist ext from NT and painful to at least 4/5 MMT to have increased functional ability to carry out selfcare and higher-level homecare tasks with less difficulty. Goal status: INITIAL  5.  Pt will decrease pain at worst from 7/10 to 3/10 or better to have better sleep and occupational participation in daily roles. Goal status: INITIAL  ASSESSMENT:  CLINICAL IMPRESSION: 12/22/22: She is really doing well now and tolerating upgrades to strengthening.  If she continues to do well and has no significant problems with biceps and triceps strengthening either we can likely discharge in the next 1-2 visits as long she is very cautious about avoiding exacerbations.  12/19/22: She is really turned the corner and now tolerating light isometric strengthening of grip and also at the wrist.  She should continue these very lightly and not aggressively till next week and if she still doing well she will learn light eccentric strengthening and will also try to upgrade her stretches to be a bit more vigorous.  She should avoid exacerbation at all costs and go right back to immobilization in her orthosis as needed.   PLAN:  OT FREQUENCY: 1-2x/week  OT DURATION: 6 weeks through 01/09/23 as needed up to 10 total visits   PLANNED INTERVENTIONS: self care/ADL training, therapeutic exercise, therapeutic activity, neuromuscular re-education, manual therapy, gait training, splinting, electrical stimulation, ultrasound, iontophoresis, fluidotherapy, compression bandaging, moist heat, cryotherapy, contrast bath, patient/family education, energy conservation, coping strategies training, Re-evaluation, and Dry needling   CONSULTED AND AGREED WITH PLAN OF CARE: Patient  PLAN FOR NEXT SESSION:   Reviewed new eccentric's as well as biceps, triceps.  Continue to upgrade  strengthening and functional activities as tolerated until discharge hopefully in the next 1-2 sessions/weeks as long as she does not have exacerbation.  Fannie Knee, OTR/L , CHT 12/22/2022, 9:56 AM

## 2022-12-19 ENCOUNTER — Encounter: Payer: Self-pay | Admitting: Rehabilitative and Restorative Service Providers"

## 2022-12-19 ENCOUNTER — Ambulatory Visit: Payer: 59 | Admitting: Rehabilitative and Restorative Service Providers"

## 2022-12-19 DIAGNOSIS — M6281 Muscle weakness (generalized): Secondary | ICD-10-CM

## 2022-12-19 DIAGNOSIS — M79601 Pain in right arm: Secondary | ICD-10-CM | POA: Diagnosis not present

## 2022-12-19 DIAGNOSIS — R202 Paresthesia of skin: Secondary | ICD-10-CM | POA: Diagnosis not present

## 2022-12-19 DIAGNOSIS — M25631 Stiffness of right wrist, not elsewhere classified: Secondary | ICD-10-CM | POA: Diagnosis not present

## 2022-12-22 ENCOUNTER — Ambulatory Visit: Payer: 59 | Admitting: Rehabilitative and Restorative Service Providers"

## 2022-12-22 ENCOUNTER — Encounter: Payer: Self-pay | Admitting: Rehabilitative and Restorative Service Providers"

## 2022-12-22 DIAGNOSIS — M25631 Stiffness of right wrist, not elsewhere classified: Secondary | ICD-10-CM

## 2022-12-22 DIAGNOSIS — M6281 Muscle weakness (generalized): Secondary | ICD-10-CM | POA: Diagnosis not present

## 2022-12-22 DIAGNOSIS — M79601 Pain in right arm: Secondary | ICD-10-CM

## 2022-12-22 DIAGNOSIS — R202 Paresthesia of skin: Secondary | ICD-10-CM | POA: Diagnosis not present

## 2022-12-24 ENCOUNTER — Ambulatory Visit (HOSPITAL_BASED_OUTPATIENT_CLINIC_OR_DEPARTMENT_OTHER): Payer: 59 | Admitting: Orthopaedic Surgery

## 2022-12-24 ENCOUNTER — Ambulatory Visit: Payer: 59 | Admitting: Family Medicine

## 2022-12-24 DIAGNOSIS — G5630 Lesion of radial nerve, unspecified upper limb: Secondary | ICD-10-CM

## 2022-12-24 NOTE — Addendum Note (Signed)
Addended by: Jeanella Cara on: 12/24/2022 12:26 PM   Modules accepted: Orders

## 2022-12-24 NOTE — Progress Notes (Signed)
Chief Complaint: Right forearm pain     History of Present Illness:   12/24/2022: Presents today for follow-up of the right forearm.  She did get a extremely good relief from the lidocaine based injection in the cubital tunnel with Dr. Shon Baton.  She has been making some improvement with occupational therapy and splinting although she recently attempted to get back to work with significant pain and seems to be back more at square [address].  Susan Boyle is a 32 y.o. 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  She states that she did not have an injury while at work several months prior where she was pushing down on bags and subsequently has felt a burning persistent pain with weakness with wrist extension of the forearm on the right.  Denies any previous injuries.  She has not undergone any therapy for this.  She is here today for further discussion    Surgical History:   None  PMH/PSH/Family History/Social History/Meds/Allergies:    Past Medical History:  Diagnosis Date   Allergy    Seasonal. Taking allergy injections   Anxiety    Asthma    Back pain, chronic    Depression    Depression screen 12/10/2022   Elevated cholesterol with high triglycerides 11/26/2022   Gastroesophageal reflux disease 12/10/2022   Heartburn    Joint pain    Knee pain, chronic    Shortness of breath    Past Surgical History:  Procedure Laterality Date   HERNIA REPAIR     inguinal hernia repair as infant   NERVE, TENDON AND ARTERY REPAIR Left 07/20/2013   Procedure: NERVE,  REPAIR;  Surgeon: Nicki Reaper, MD;  Location: [facility];  Service: Orthopedics;  Laterality: Left;   thumb surgery Left    Nerve repair   Social History   Socioeconomic History   Marital status: Married    Spouse name: Not on file   Number of children: 0   Years of education: Not on file   Highest education  level: Some college, no degree  Occupational History   Not on file  Tobacco Use   Smoking status: Every Day    Current packs/day: 0.00    Average packs/day: 0.3 packs/day for 10.0 years (2.5 ttl pk-yrs)    Types: Cigarettes, E-cigarettes    Last attempt to quit: 01/25/2019    Years since quitting: 3.9   Smokeless tobacco: Never   Tobacco comments:    stopped in November 2021   Vaping Use   Vaping status: Every Day   Substances: Nicotine  Substance and Sexual Activity   Alcohol use: Yes    Alcohol/week: 5.0 standard drinks of alcohol    Types: 5 Glasses of wine per week    Comment: social   Drug use: No   Sexual activity: Yes    Birth control/protection: I.U.D.  Other Topics Concern   Not on file  Social History Narrative   Not on file   Social Determinants of Health   Financial Resource Strain: Medium Risk (10/16/2022)   Overall Financial Resource Strain (CARDIA)    Difficulty of Paying Living Expenses: Somewhat hard  Food Insecurity: No Food Insecurity (10/16/2022)   Hunger Vital Sign    Worried About Running Out of  Food in the Last Year: Never true    Ran Out of Food in the Last Year: Never true  Transportation Needs: No Transportation Needs (10/16/2022)   PRAPARE - Administrator, Civil Service (Medical): No    Lack of Transportation (Non-Medical): No  Physical Activity: Insufficiently Active (10/16/2022)   Exercise Vital Sign    Days of Exercise per Week: 1 day    Minutes of Exercise per Session: 30 min  Stress: Stress Concern Present (10/16/2022)   [employer] of Occupational Health - Occupational Stress Questionnaire    Feeling of Stress : Very much  Social Connections: Socially Isolated (10/16/2022)   Social Connection and Isolation Panel [NHANES]    Frequency of Communication with Friends and Family: Once a week    Frequency of Social Gatherings with Friends and Family: Once a week    Attends Religious Services: Never    Database administrator  or Organizations: No    Attends Engineer, structural: Not on file    Marital Status: Living with partner   Family History  Adopted: Yes  Problem Relation Age of Onset   Hyperlipidemia Father    Hypertension Father    Heart disease Father 50       MI   Obesity Father    Vision loss Maternal Grandmother    Cancer Maternal Grandfather    Vision loss Maternal Uncle    Allergies  Allergen Reactions   Sulfamethoxazole Nausea And Vomiting   Current Outpatient Medications  Medication Sig Dispense Refill   albuterol (VENTOLIN HFA) 108 (90 Base) MCG/ACT inhaler Inhale 2 puffs into the lungs every 4 (four) hours as needed for wheezing or shortness of breath (coughing fits) 18 g 0   buPROPion (WELLBUTRIN XL) 150 MG 24 hr tablet Take 1 tablet (150 mg total) by mouth at bedtime. 90 tablet 3   EPINEPHrine 0.3 mg/0.3 mL IJ SOAJ injection Inject 0.3 mg into the muscle as needed for anaphylaxis. 2 each 2   famotidine (PEPCID) 40 MG tablet Take 1 tablet (40 mg total) by mouth at bedtime. 30 tablet 0   fluticasone-salmeterol (ADVAIR DISKUS) 250-50 MCG/ACT AEPB Inhale 1 puff into the lungs in the morning and at bedtime. Rinse mouth after each use. 60 each 5   HYDROcodone-acetaminophen (NORCO/VICODIN) 5-325 MG tablet Take 1 tablet by mouth every 6 (six) hours as needed for moderate pain or severe pain. 6 tablet 0   levocetirizine (XYZAL) 5 MG tablet Take 1 tablet (5 mg total) by mouth every evening. 30 tablet 2   levonorgestrel (MIRENA) 20 MCG/24HR IUD 1 each by Intrauterine route once.     lisdexamfetamine (VYVANSE) 60 MG capsule Take 1 capsule (60 mg total) by mouth every morning. 90 capsule 0   nystatin-triamcinolone ointment (MYCOLOG) Apply 1 Application topically 2 (two) times daily. 30 g 0   ondansetron (ZOFRAN-ODT) 4 MG disintegrating tablet Dissolve 1 tablet (4 mg total) in mouth every 8 (eight) hours as needed. 20 tablet 0   PARoxetine (PAXIL) 20 MG tablet Take 1 tablet (20 mg total) by  mouth at bedtime. 90 tablet 3   traZODone (DESYREL) 50 MG tablet Take 1-3 tablets (50-150 mg total) by mouth at bedtime as needed. 270 tablet 3   No current facility-administered medications for this visit.   No results found.  Review of Systems:   A ROS was performed including pertinent positives and negatives as documented in the HPI.  Physical Exam :   Constitutional: NAD  and appears stated age Neurological: Alert and oriented Psych: Appropriate affect and cooperative There were no vitals taken for this visit.   Comprehensive Musculoskeletal Exam:    Tenderness about the mobile wad of the right lateral forearm.  There is pain with resisted extension.  There is a burning distribution type pain in the course of the posterior interosseous nerve  Imaging:   Xray (2 views right forearm x-ray): Normal   I personally reviewed and interpreted the radiographs.   Assessment:   32 y.o. female right-hand-dominant female presents with what appears to be radial tunnel type syndrome.  As time she has gotten very good relief from a diagnostic lidocaine injection in the radial tunnel with Dr. Shon Baton.  Given this and the fact that she is not progressing with physical therapy I like to plan to refer her to Dr. Denese Killings for discussion of possible surgical intervention.  I will plan to see her back as needed  Plan :    -Return to clinic as needed     I personally saw and evaluated the patient, and participated in the management and treatment plan.  Huel Cote, MD Attending Physician, Orthopedic Surgery  This document was dictated using Dragon voice recognition software. A reasonable attempt at proof reading has been made to minimize errors.

## 2022-12-26 ENCOUNTER — Encounter (HOSPITAL_BASED_OUTPATIENT_CLINIC_OR_DEPARTMENT_OTHER): Payer: Self-pay | Admitting: Student

## 2022-12-26 ENCOUNTER — Ambulatory Visit (HOSPITAL_BASED_OUTPATIENT_CLINIC_OR_DEPARTMENT_OTHER): Payer: 59 | Admitting: Student

## 2022-12-26 ENCOUNTER — Other Ambulatory Visit (HOSPITAL_BASED_OUTPATIENT_CLINIC_OR_DEPARTMENT_OTHER): Payer: Self-pay

## 2022-12-26 DIAGNOSIS — G5630 Lesion of radial nerve, unspecified upper limb: Secondary | ICD-10-CM

## 2022-12-26 MED ORDER — METHYLPREDNISOLONE 4 MG PO TBPK
ORAL_TABLET | ORAL | 0 refills | Status: DC
  Filled 2022-12-26: qty 21, 6d supply, fill #0

## 2022-12-26 NOTE — Progress Notes (Signed)
Chief Complaint: Right forearm pain     History of Present Illness:   12/26/2022: Susan Boyle is here today for follow-up of right arm pain.  She was in clinic 2 days ago for an appointment with Dr. Steward Drone.  She states that since then, she has accidentally banged her arm into multiple objects and it has significantly irritated the pain in her arm.  The pain radiates from her hand all the way up to her right shoulder and is 7/10 in severity.  She has been taking Tylenol and Advil as well as using lidocaine patches, ice, and heat without much relief.  She states that since her symptoms began, the only complete resolution of symptoms has come from lidocaine injection and dry needling.   12/24/22: Presents today for follow-up of the right forearm.  She did get a extremely good relief from the lidocaine based injection in the cubital tunnel with Dr. Shon Baton.  She has been making some improvement with occupational therapy and splinting although she recently attempted to get back to work with significant pain and seems to be back more at square 1.  Surgical History:   None  PMH/PSH/Family History/Social History/Meds/Allergies:    Past Medical History:  Diagnosis Date   Allergy    Seasonal. Taking allergy injections   Anxiety    Asthma    Back pain, chronic    Depression    Depression screen 12/10/2022   Elevated cholesterol with high triglycerides 11/26/2022   Gastroesophageal reflux disease 12/10/2022   Heartburn    Joint pain    Knee pain, chronic    Shortness of breath    Past Surgical History:  Procedure Laterality Date   HERNIA REPAIR     inguinal hernia repair as infant   NERVE, TENDON AND ARTERY REPAIR Left 07/20/2013   Procedure: NERVE,  REPAIR;  Surgeon: Nicki Reaper, MD;  [facility];  Service: Orthopedics;  Laterality: Left;   thumb surgery Left    Nerve repair   Social History   Socioeconomic History   Marital  status: Married    Spouse name: Not on file   Number of children: 0   Years of education: Not on file   Highest education level: Some college, no degree  Occupational History   Not on file  Tobacco Use   Smoking status: Every Day    Current packs/day: 0.00    Average packs/day: 0.3 packs/day for 10.0 years (2.5 ttl pk-yrs)    Types: Cigarettes, E-cigarettes    Last attempt to quit: 01/25/2019    Years since quitting: 3.9   Smokeless tobacco: Never   Tobacco comments:    stopped in November 2021   Vaping Use   Vaping status: Every Day   Substances: Nicotine  Substance and Sexual Activity   Alcohol use: Yes    Alcohol/week: 5.0 standard drinks of alcohol    Types: 5 Glasses of wine per week    Comment: social   Drug use: No   Sexual activity: Yes    Birth control/protection: I.U.D.  Other Topics Concern   Not on file  Social History Narrative   Not on file   Social Determinants of Health   Financial Resource Strain: Medium Risk (10/16/2022)   Overall Financial Resource Strain (CARDIA)    Difficulty of  Paying Living Expenses: Somewhat hard  Food Insecurity: No Food Insecurity (10/16/2022)   Hunger Vital Sign    Worried About Running Out of Food in the Last Year: Never true    Ran Out of Food in the Last Year: Never true  Transportation Needs: No Transportation Needs (10/16/2022)   PRAPARE - Administrator, Civil Service (Medical): No    Lack of Transportation (Non-Medical): No  Physical Activity: Insufficiently Active (10/16/2022)   Exercise Vital Sign    Days of Exercise per Week: 1 day    Minutes of Exercise per Session: 30 min  Stress: Stress Concern Present (10/16/2022)   [employer] of Occupational Health - Occupational Stress Questionnaire    Feeling of Stress : Very much  Social Connections: Socially Isolated (10/16/2022)   Social Connection and Isolation Panel [NHANES]    Frequency of Communication with Friends and Family: Once a week     Frequency of Social Gatherings with Friends and Family: Once a week    Attends Religious Services: Never    Database administrator or Organizations: No    Attends Engineer, structural: Not on file    Marital Status: Living with partner   Family History  Adopted: Yes  Problem Relation Age of Onset   Hyperlipidemia Father    Hypertension Father    Heart disease Father 10       MI   Obesity Father    Vision loss Maternal Grandmother    Cancer Maternal Grandfather    Vision loss Maternal Uncle    Allergies  Allergen Reactions   Sulfamethoxazole Nausea And Vomiting   Current Outpatient Medications  Medication Sig Dispense Refill   methylPREDNISolone (MEDROL DOSEPAK) 4 MG TBPK tablet Take per packet instructions 21 each 0   albuterol (VENTOLIN HFA) 108 (90 Base) MCG/ACT inhaler Inhale 2 puffs into the lungs every 4 (four) hours as needed for wheezing or shortness of breath (coughing fits) 18 g 0   buPROPion (WELLBUTRIN XL) 150 MG 24 hr tablet Take 1 tablet (150 mg total) by mouth at bedtime. 90 tablet 3   EPINEPHrine 0.3 mg/0.3 mL IJ SOAJ injection Inject 0.3 mg into the muscle as needed for anaphylaxis. 2 each 2   famotidine (PEPCID) 40 MG tablet Take 1 tablet (40 mg total) by mouth at bedtime. 30 tablet 0   fluticasone-salmeterol (ADVAIR DISKUS) 250-50 MCG/ACT AEPB Inhale 1 puff into the lungs in the morning and at bedtime. Rinse mouth after each use. 60 each 5   HYDROcodone-acetaminophen (NORCO/VICODIN) 5-325 MG tablet Take 1 tablet by mouth every 6 (six) hours as needed for moderate pain or severe pain. 6 tablet 0   levocetirizine (XYZAL) 5 MG tablet Take 1 tablet (5 mg total) by mouth every evening. 30 tablet 2   levonorgestrel (MIRENA) 20 MCG/24HR IUD 1 each by Intrauterine route once.     lisdexamfetamine (VYVANSE) 60 MG capsule Take 1 capsule (60 mg total) by mouth every morning. 90 capsule 0   nystatin-triamcinolone ointment (MYCOLOG) Apply 1 Application topically 2  (two) times daily. 30 g 0   ondansetron (ZOFRAN-ODT) 4 MG disintegrating tablet Dissolve 1 tablet (4 mg total) in mouth every 8 (eight) hours as needed. 20 tablet 0   PARoxetine (PAXIL) 20 MG tablet Take 1 tablet (20 mg total) by mouth at bedtime. 90 tablet 3   traZODone (DESYREL) 50 MG tablet Take 1-3 tablets (50-150 mg total) by mouth at bedtime as needed. 270 tablet 3  No current facility-administered medications for this visit.   No results found.  Review of Systems:   A ROS was performed including pertinent positives and negatives as documented in the HPI.  Physical Exam :   Constitutional: NAD and appears stated age Neurological: Alert and oriented Psych: Appropriate affect and cooperative There were no vitals taken for this visit.   Comprehensive Musculoskeletal Exam:    Significant tenderness over the olecranon and lateral epicondyle extending diffusely throughout the lateral forearm.  Pain does extend into the radial wrist.  Minimal pain with wrist flexion and extension.  Radial pulse 2+.  Able to form a fist with good strength.  Imaging:    Assessment:   32 y.o. female right-hand-dominant female with a flareup of her right arm pain since yesterday.  She has been seen for suspected radial tunnel syndrome and was just recently referred to Dr. Denese Killings to discuss potential surgery which has been scheduled for 12/4.  I did discuss that I cannot offer an injection however I would recommend an anti-inflammatory or steroid to help calm this down.  Patient reports that she was placed on a steroid a while ago and did well with this so I will send her in a Medrol Dosepak.  She will continue to update [place] on symptoms and can return to clinic as needed.  Plan :    -Start Medrol Dosepak -Follow-up with Dr. Denese Killings as scheduled     I personally saw and evaluated the patient, and participated in the management and treatment plan.   Hazle Nordmann, PA-C Orthopedics

## 2022-12-26 NOTE — Therapy (Signed)
OUTPATIENT OCCUPATIONAL THERAPY TREATMENT NOTE  Patient Name: Susan Boyle MRN: [id number] DOB:[birth year], 32 y.o., female Today's Date: 12/31/2022  PCP: Sundra Aland, MD REFERRING PROVIDER:  Huel Cote, MD    END OF SESSION:  OT End of Session - 12/31/22 0815     Visit Number 8    Number of Visits 10    Date for OT Re-Evaluation 01/09/23    Authorization Type [employer]    OT Start Time [phone]    OT Stop Time [phone]    OT Time Calculation (min) 32 min    Activity Tolerance Patient tolerated treatment well;No increased pain;Patient limited by fatigue    Behavior During Therapy [facility] for tasks assessed/performed                 Past Medical History:  Diagnosis Date   Allergy    Seasonal. Taking allergy injections   Anxiety    Asthma    Back pain, chronic    Depression    Depression screen 12/10/2022   Elevated cholesterol with high triglycerides 11/26/2022   Gastroesophageal reflux disease 12/10/2022   Heartburn    Joint pain    Knee pain, chronic    Shortness of breath    Past Surgical History:  Procedure Laterality Date   HERNIA REPAIR     inguinal hernia repair as infant   NERVE, TENDON AND ARTERY REPAIR Left 07/20/2013   Procedure: NERVE,  REPAIR;  Surgeon: Nicki Reaper, MD;  Location: [facility];  Service: Orthopedics;  Laterality: Left;   thumb surgery Left    Nerve repair   Patient Active Problem List   Diagnosis Date Noted   Vitamin D deficiency 12/30/2022   Other fatigue 12/10/2022   SOBOE (shortness of breath on exertion) 12/10/2022   Gastroesophageal reflux disease 12/10/2022   Depression screen 12/10/2022   BMI 31.0-31.9,adult 12/10/2022   Generalized obesity 12/10/2022   Anxiety and depression 11/26/2022   Class 1 obesity due to excess calories with body mass index (BMI) of 31.0 to 31.9 in adult 11/26/2022   Elevated cholesterol with high triglycerides 11/26/2022   Attention deficit hyperactivity disorder  (ADHD) 09/17/2022   IUD contraception 09/17/2022   Acute pain of right shoulder 03/22/2021   Seasonal and perennial allergic rhinoconjunctivitis 11/05/2020   Allergic reaction 07/04/2020   Food allergy 05/02/2020   Environmental allergies 05/02/2020   Dermatographism 05/02/2020   Asthma    Lateral epicondylitis of right elbow 09/26/2015   Subacromial bursitis 08/15/2015    ONSET DATE: July 2024 DOI  REFERRING DIAG: Y78.295 (ICD-10-CM) - Pain in right forearm   THERAPY DIAG:  Muscle weakness (generalized)  Paresthesia of skin  Stiffness of right wrist, not elsewhere classified  Pain in right arm  Rationale for Evaluation and Treatment: Rehabilitation  PERTINENT HISTORY: Radial tunnel syndrome program   She arrives late, states she was in a "pushup" position with 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 She tried a wrist cock-up prefab for awhile, but was advised to stop by other medical provider as her injury isn't a "wrist sprain."  PRECAUTIONS: None  RED FLAGS: None   WEIGHT BEARING RESTRICTIONS: No  SUBJECTIVE:   SUBJECTIVE STATEMENT: She arrives late, states around Friday, 02/24/22, she had acute exacerbation of pain symptoms from multiple sources (hitting elbow at night, over use, possibly irritation from strengthening), and she went to her MD for a steroid pack and was advised to meet with surgeon about radial tunnel release.     PAIN:  Are you having pain?     Yes: NPRS scale: 3/10 at rest now, bu on Saturday, it went up to 6/10 Pain location: Rt arm dorsal radial nerve distribution Pain description: burning, aching, "lightning" Aggravating factors: motion Relieving factors: rest, ice    PATIENT  GOALS: To improve pain levels and increased use of the right dominant hand.  NEXT MD VISIT: 5 weeks    OBJECTIVE: (All objective assessments below are from initial evaluation on: 11/26/22 unless otherwise specified.)   HAND DOMINANCE: Right   ADLs: Overall ADLs: States decreased ability to grab, hold household objects, pain and difficulty to open containers, perform FMS tasks (manipulate fasteners on clothing), mild to moderate bathing problems as well.    FUNCTIONAL OUTCOME MEASURES: Eval: Patient Specific Functional Scale: 1 /10  (opening zip locks, writing, open tubes)  (Higher Score  =  Better Ability for the Selected Tasks)      UPPER EXTREMITY ROM     Shoulder to Wrist AROM Right eval Rt 12/03/22 Rt 12/05/22 Rt 12/08/22 Rt 12/11/22 Rt 12/19/22 Rt  12/22/22 Rt 12/31/22  Forearm supination 65 61  61 62 85 60 60  Forearm pronation  72 83  83 88 63 81 81  Wrist flexion 71 47 53 41 60 85 62 (75* after DN) 84  Wrist extension 57 37 41 47 47 62 55 66  (Blank rows = not tested)   Hand AROM Right eval  Full Fist Ability (or Gap to Distal Palmar Crease) full  Thumb Opposition  (Kapandji Scale)  WFL  (Blank rows = not tested)   UPPER EXTREMITY MMT:     MMT Right 12/19/22  Shoulder flexion   Shoulder abduction   Shoulder adduction   Shoulder extension   Shoulder internal rotation   Shoulder external rotation   Middle trapezius   Lower trapezius   Elbow flexion   Elbow extension   Forearm supination   Forearm pronation   Wrist flexion 4/5 tender  Wrist extension 4-/5 more tender  Wrist ulnar deviation   Wrist radial deviation   (Blank rows = not tested)  HAND FUNCTION: 12/24/22: Grip Rt: 56#  12/19/22: Grip strength Right:  23 lbs, Left: 68 lbs   Eval: Observed weakness in affected Rt hand.  Details when time allows   COORDINATION: 12/08/22: 9 Hole Peg Test Right: 36.5 sec(~20 sec is WFL)   Eval: Observed coordination impairments with affected right  hand.  Details when time allows   SENSATION: Eval: Complaints of pain from the arcade approach to the dorsal distal wrist crease, sharp and stabbing at times with supination and wrist extension motions, no paresthesia to tips of fingers  OBSERVATIONS:   Eval: Pain near the arcade of motion the right dorsal forearm, pain with wrist extension or supination typical of radial tunnel syndrome.   TODAY'S TREATMENT:  12/31/22: She does perform active range of motion for new measures and exercise that shows that she has not lost much motion despite recent exacerbation which is a positive finding.  Today's session focuses mainly on safety education and reeducation for avoiding exacerbations, what to do in case of painful exacerbation, how to  modify activities and routines, how to protect her arm, and how to identify exacerbation and flareup and the appropriate responses to take in those cases.   Her exercise program was reviewed, and she was told to perform the lighter and easier stretches and no strengthening during exacerbations.  Once her pain is back to a level below 3 out of 10, she can begin stronger stretches but only as tolerated.  She should avoid any strengthening for at least for 5 days and have consistently low pain before trying that.  Next, she was given a compressive arm sleeve with padding to wear in the night and the day as needed for a "bump guard," similar to a Heelbo.  She states getting excellent relief of pain after the past sessions manual therapy dry needling, and she request this again today.  OT uses a .030 x 40mm needle inserted only partly into the ECRB and extensor wad of the right arm achieving multiple small twitch responses-after which she states feeling no pain and complete relief.  She has no bleeding bruising, etc.  Again she is cautioned to "take it easy" despite her relief in pain today.  She states understanding and will return next week for a progress note will we will check  her goals and status completely.    12/22/22: She performs active range of motion for exercise as well as new measures which does show some stiffness due to increased pain from recent work activities.  Is relatively minor change though.  OT puts her on moist heat for 5 minutes while updating and upgrading her home exercise program and explaining new eccentric strengthening exercises.  She states that heat feels good and helps her feel looser at the end.  OT then does manual therapy IASTM to the dorsal forearm and sore areas and she states more muscle spasm/soreness today versus sharp or acute pain.  Dry needling would be an excellent modality for this, so OT asks her to perform manual therapy dry needling modality after giving her a rundown of the benefits and possible side effects.  She agrees to this and OT uses a 0.60mm x 30mm needle inserted only partly into her extensor carpi radialis brevis, achieving an excellent twitch response and no significant pain, bleeding, bruising.  Instantly she feels a better, and her motion improves as listed above above.  Next, OT performs manual therapy kinesiotaping as she states this did help to support her after the last session, and then she does her home exercise stretches for review and practice as well as new grip training and a review isometric training.  Lastly she performs the following exercises with therapy bands and a 3 pound weight for the eccentric exercise.  She tolerates these well without any new soreness but is told to use extreme caution to prevent exacerbations at home.  She asks OT to quickly adjust her orthosis at the end of the session as it has some padding coming off and she would like it replaced.  She leaves feeling good   Exercises - Standing Shoulder Posterior Capsule Stretch  - 2-3 x daily - 3-5 reps - 15 hold - Standing neck/upper traps stretch  - 4-6 x daily - 3-5 reps - 15 sec hold - Seated Shoulder Blade Squeeze  - 4-6 x daily - 5-10  reps - Tricep Stretch- DO SEATED BY TABLE  - 3-4 x daily - 3 reps - 15 hold - Seated Wrist Extension Stretch  - 3-6 x daily - 3-5  reps - 15 hold - Wrist Flexion Stretch  - 4 x daily - 3-5 reps - 15 sec hold - Standing Radial Nerve Glide  - 3-4 x daily - 3-5 reps - Towel Roll Grip with Forearm in Neutral  - 3 x daily - 5 reps - 10 sec hold - Seated Eccentric Wrist Extension  - 1-3 x daily - 1-2 sets - 5-10 reps - 10 sec hold - Standing Elbow Extension with Anchored Resistance  - 1-3 x daily - 1-2 sets - 5-10 reps - Standing Bicep Curls with Resistance  - 1-3 x daily - 1-2 sets - 5-10 reps     PATIENT EDUCATION: Education details: See tx section above for details  Person educated: Patient Education method: Verbal Instruction, Teach back, Handouts  Education comprehension: States and demonstrates understanding, Additional Education required    HOME EXERCISE PROGRAM: Access Code: [email or web address] URL: [email or web address] Date: 11/26/2022 Prepared by: Fannie Knee   GOALS: Goals reviewed with patient? Yes   SHORT TERM GOALS: (STG required if POC>30 days) Target Date: 12/12/22  Pt will obtain protective, custom orthotic. Goal status:  MET at eval  2.  Pt will demo/state understanding of initial HEP to improve pain levels and prerequisite motion. Goal status: INITIAL   LONG TERM GOALS: Target Date: 01/09/23  Pt will improve functional ability by decreased impairment per PSFS assessment from 1 / 10 to 8/10 or better, for better quality of life. Goal status: INITIAL  2.  Pt will improve grip strength in Rt hand from NT to at least 40lbs for functional use at home and in IADLs. Goal status: INITIAL  3.  Pt will improve A/ROM in Rt wrist ext from 57 to at least 70, to have functional motion for tasks like reach and grasp.  Goal status: INITIAL  4.  Pt will improve strength in Rt wrist ext from NT and painful to at least 4/5 MMT to have increased functional ability  to carry out selfcare and higher-level homecare tasks with less difficulty. Goal status: INITIAL  5.  Pt will decrease pain at worst from 7/10 to 3/10 or better to have better sleep and occupational participation in daily roles. Goal status: INITIAL  ASSESSMENT:  CLINICAL IMPRESSION: 12/31/22: Although she has had another exacerbation, it did calm down relatively quickly though she did get a steroid pack.  Today she leaves without any pain after dry needling, and states understanding that she needs to be more mindful if she wants to heal this problem conservatively.   PLAN:  OT FREQUENCY: 1-2x/week  OT DURATION: 6 weeks through 01/09/23 as needed up to 10 total visits   PLANNED INTERVENTIONS: self care/ADL training, therapeutic exercise, therapeutic activity, neuromuscular re-education, manual therapy, gait training, splinting, electrical stimulation, ultrasound, iontophoresis, fluidotherapy, compression bandaging, moist heat, cryotherapy, contrast bath, patient/family education, energy conservation, coping strategies training, Re-evaluation, and Dry needling   CONSULTED AND AGREED WITH PLAN OF CARE: Patient  PLAN FOR NEXT SESSION:   Prog note,  discuss goals and need for more therapy   Fannie Knee, OTR/L , CHT 12/31/2022, 9:08 AM

## 2022-12-30 ENCOUNTER — Other Ambulatory Visit (HOSPITAL_COMMUNITY): Payer: Self-pay

## 2022-12-30 ENCOUNTER — Encounter: Payer: Self-pay | Admitting: Family Medicine

## 2022-12-30 ENCOUNTER — Encounter (HOSPITAL_BASED_OUTPATIENT_CLINIC_OR_DEPARTMENT_OTHER): Payer: Self-pay

## 2022-12-30 ENCOUNTER — Ambulatory Visit: Payer: 59 | Admitting: Family Medicine

## 2022-12-30 ENCOUNTER — Other Ambulatory Visit (HOSPITAL_BASED_OUTPATIENT_CLINIC_OR_DEPARTMENT_OTHER): Payer: Self-pay | Admitting: Student

## 2022-12-30 VITALS — BP 116/77 | HR 76 | Temp 97.9°F | Ht 63.0 in | Wt 177.0 lb

## 2022-12-30 DIAGNOSIS — K219 Gastro-esophageal reflux disease without esophagitis: Secondary | ICD-10-CM

## 2022-12-30 DIAGNOSIS — E559 Vitamin D deficiency, unspecified: Secondary | ICD-10-CM | POA: Diagnosis not present

## 2022-12-30 DIAGNOSIS — F419 Anxiety disorder, unspecified: Secondary | ICD-10-CM

## 2022-12-30 DIAGNOSIS — Z6831 Body mass index (BMI) 31.0-31.9, adult: Secondary | ICD-10-CM

## 2022-12-30 DIAGNOSIS — F32A Depression, unspecified: Secondary | ICD-10-CM

## 2022-12-30 DIAGNOSIS — E6609 Other obesity due to excess calories: Secondary | ICD-10-CM

## 2022-12-30 DIAGNOSIS — E66811 Obesity, class 1: Secondary | ICD-10-CM

## 2022-12-30 MED ORDER — VITAMIN D (ERGOCALCIFEROL) 1.25 MG (50000 UNIT) PO CAPS
50000.0000 [IU] | ORAL_CAPSULE | ORAL | 0 refills | Status: DC
Start: 2022-12-30 — End: 2023-01-26
  Filled 2022-12-30: qty 5, 35d supply, fill #0

## 2022-12-30 MED ORDER — METHOCARBAMOL 500 MG PO TABS
500.0000 mg | ORAL_TABLET | Freq: Four times a day (QID) | ORAL | 0 refills | Status: AC | PRN
  Filled 2022-12-30: qty 40, 10d supply, fill #0

## 2022-12-30 NOTE — Progress Notes (Signed)
Office: [phone]  /  Fax: [phone]  WEIGHT SUMMARY AND BIOMETRICS  Starting Date: 12/10/22  Starting Weight: 180lb   Weight Lost Since Last Visit: 3lb   Vitals Temp: 97.9 F (36.6 C) BP: 116/77 Pulse Rate: 76 SpO2: 97 %   Body Composition  Body Fat %: 40.2 % Fat Mass (lbs): 71.4 lbs Muscle Mass (lbs): 101 lbs Total Body Water (lbs): 70 lbs Visceral Fat Rating : 7   HPI  Chief Complaint: OBESITY  Susan Boyle is here to discuss her progress with her obesity treatment plan. She is on the the Category 2 Plan and states she is following her eating plan approximately 60 % of the time. She states she is exercising 30 minutes 3 times per week.   Interval History:  Since last office visit she is down 3 lb She is up 0.8 lb of muscle mass and is down 3 lb of body fat in the past 2 weeks She did not like the low cal bread She swapped out bread for [employer] low carb wrap with one egg and [place] sausage She has had cravings for pizza and has been hungry in the middle of the night She hasn't been working out or walking consistently  Pharmacotherapy: none  PHYSICAL EXAM:  Blood pressure 116/77, pulse 76, temperature 97.9 F (36.6 C), height 5\' 3"  (1.6 m), weight 177 lb (80.3 kg), SpO2 97%. Body mass index is 31.35 kg/m.  General: She is overweight, cooperative, alert, well developed, and in no acute distress. PSYCH: Has normal mood, affect and thought process.   Lungs: Normal breathing effort, no conversational dyspnea.   ASSESSMENT AND PLAN  TREATMENT PLAN FOR OBESITY:  Recommended Dietary Goals  Tahesha is currently in the action stage of change. As such, her goal is to continue weight management plan. She has agreed to the Category 2 Plan. - reviewed options to make changes on AVS with patient  Behavioral Intervention  We discussed the following Behavioral Modification Strategies today: increasing lean protein intake to established goals, increasing vegetables,  increasing lower glycemic fruits, increasing fiber rich foods, increasing water intake , work on meal planning and preparation, keeping healthy foods at home, work on managing stress, creating time for self-care and relaxation, and continue to work on maintaining a reduced calorie state, getting the recommended amount of protein, incorporating whole foods, making healthy choices, staying well hydrated and practicing mindfulness when eating..  Additional resources provided today: NA  Recommended Physical Activity Goals  Estefanny has been advised to work up to 150 minutes of moderate intensity aerobic activity a week and strengthening exercises 2-3 times per week for cardiovascular health, weight loss maintenance and preservation of muscle mass.   She has agreed to Think about enjoyable ways to increase daily physical activity and overcoming barriers to exercise and Increase physical activity in their day and reduce sedentary time (increase NEAT).  Pharmacotherapy changes for the treatment of obesity: none  ASSOCIATED CONDITIONS ADDRESSED TODAY  Vitamin D deficiency Assessment & Plan: Last vitamin D Lab Results  Component Value Date   VD25OH 13.4 (L) 12/10/2022   Reviewed lab results with patient.  She has findings of vitamin D deficiency we discussed problems of vitamin D deficiency including fatigue, poor bone health and poor immune function.  She is currently not on a vitamin D supplement.  She has complaints of fatigue.  Begin vitamin D 50,000 IU once weekly.  Recheck lab in 3 to 4 months  Orders: -  Vitamin D (Ergocalciferol); Take 1 capsule (50,000 Units total) by mouth every 7 (seven) days.  Dispense: 5 capsule; Refill: 0  Class 1 obesity due to excess calories with body mass index (BMI) of 31.0 to 31.9 in adult, unspecified whether serious comorbidity present  Anxiety and depression Assessment & Plan: High stress levels with a history of depression and anxiety have been  barriers to her progress.  She is having a lot of food cravings and poor sleep at night.  I recommended that she follow-up with her psychiatrist to discuss her sleeping disruption.  She is currently on Wellbutrin XL 150 mg daily, Paxil 20 mg daily and trazodone at bedtime.  We discussed getting out of the habit of snacking in the middle of the night.  Work on improving sleep hygiene and stress reduction.   Gastroesophageal reflux disease, unspecified whether esophagitis present Assessment & Plan: Improving on famotidine 40 mg nightly, added at her last visit.  She is still sometimes waking up in the middle night to eat.  She avoids large volumes of food at night and highly acidic foods.  Continue working on dietary changes.  Continue famotidine 40 mg nightly.  Avoid eating in the middle of the night       She was informed of the importance of frequent follow up visits to maximize her success with intensive lifestyle modifications for her multiple health conditions.   ATTESTASTION STATEMENTS:  Reviewed by clinician on day of visit: allergies, medications, problem list, medical history, surgical history, family history, social history, and previous encounter notes pertinent to obesity diagnosis.   I have personally spent 30 minutes total time today in preparation, patient care, nutritional counseling and documentation for this visit, including the following: review of clinical lab tests; review of medical tests/procedures/services.      Glennis Brink, DO DABFM, DABOM [facility] [address] [place], [place] [address] [phone]

## 2022-12-30 NOTE — Assessment & Plan Note (Signed)
Last vitamin D Lab Results  Component Value Date   VD25OH 13.4 (L) 12/10/2022   Reviewed lab results with patient.  She has findings of vitamin D deficiency we discussed problems of vitamin D deficiency including fatigue, poor bone health and poor immune function.  She is currently not on a vitamin D supplement.  She has complaints of fatigue.  Begin vitamin D 50,000 IU once weekly.  Recheck lab in 3 to 4 months

## 2022-12-30 NOTE — Assessment & Plan Note (Addendum)
Improving on famotidine 40 mg nightly, added at her last visit.  She is still sometimes waking up in the middle night to eat.  She avoids large volumes of food at night and highly acidic foods.  Continue working on dietary changes.  Continue famotidine 40 mg nightly.  Avoid eating in the middle of the night

## 2022-12-30 NOTE — Assessment & Plan Note (Signed)
High stress levels with a history of depression and anxiety have been barriers to her progress.  She is having a lot of food cravings and poor sleep at night.  I recommended that she follow-up with her psychiatrist to discuss her sleeping disruption.  She is currently on Wellbutrin XL 150 mg daily, Paxil 20 mg daily and trazodone at bedtime.  We discussed getting out of the habit of snacking in the middle of the night.  Work on improving sleep hygiene and stress reduction.

## 2022-12-30 NOTE — Patient Instructions (Addendum)
Option 2 for breakfast: Protein shake ( Premier Protein, FairLife, CorePower (26), Quest) + one serving of fruit (ANY)  Expand fruit to 2 servings per day (fresh or frozen)- ANY  Keep lunches: protein, carb, fiber -- fruits/ veggies Real Good Frozen meals  Dinner: you can have 1/4 plate starch source - rice, potato -- sweet or white, quinoa, keto bun, low carb tortilla, Joseph's Lavish Bread, beans or chickpeas  Track daily steps Aim for > 6,000 steps/ day

## 2022-12-31 ENCOUNTER — Other Ambulatory Visit (HOSPITAL_COMMUNITY): Payer: Self-pay

## 2022-12-31 ENCOUNTER — Encounter: Payer: Self-pay | Admitting: Rehabilitative and Restorative Service Providers"

## 2022-12-31 ENCOUNTER — Ambulatory Visit: Payer: 59 | Admitting: Rehabilitative and Restorative Service Providers"

## 2022-12-31 ENCOUNTER — Ambulatory Visit (INDEPENDENT_AMBULATORY_CARE_PROVIDER_SITE_OTHER): Payer: 59 | Admitting: *Deleted

## 2022-12-31 DIAGNOSIS — M79601 Pain in right arm: Secondary | ICD-10-CM | POA: Diagnosis not present

## 2022-12-31 DIAGNOSIS — M25631 Stiffness of right wrist, not elsewhere classified: Secondary | ICD-10-CM | POA: Diagnosis not present

## 2022-12-31 DIAGNOSIS — J309 Allergic rhinitis, unspecified: Secondary | ICD-10-CM | POA: Diagnosis not present

## 2022-12-31 DIAGNOSIS — M6281 Muscle weakness (generalized): Secondary | ICD-10-CM | POA: Diagnosis not present

## 2022-12-31 DIAGNOSIS — R202 Paresthesia of skin: Secondary | ICD-10-CM | POA: Diagnosis not present

## 2022-12-31 MED ORDER — EPINEPHRINE 0.3 MG/0.3ML IJ SOAJ
0.3000 mg | INTRAMUSCULAR | 1 refills | Status: AC | PRN
  Filled 2022-12-31: qty 2, 2d supply, fill #0

## 2023-01-02 NOTE — Therapy (Signed)
OUTPATIENT OCCUPATIONAL THERAPY TREATMENT  & PROGRESS NOTE  Patient Name: Susan Boyle MRN: [id number] DOB:[birth year], 32 y.o., female Today's Date: 01/05/2023  PCP: Sundra Aland, MD REFERRING PROVIDER:  Huel Cote, MD       Progress Note Reporting Period 11/26/22 to 01/05/23  01/05/23: She is doing well conservatively from the start of care until now.  She has had a couple exacerbations which has slowed down her progress, but largely she seems to understand these precautions now and continues to make slow improvements.  She is working on strengthening cautiously now, and would like to continue therapy on an intermittent basis to help with recommendations, manual therapy if needed, and tracking her status.  She would like to avoid surgery or invasive procedures if possible and this does seem realistic as long as she can continue to prevent exacerbation.  OT FREQUENCY: 1x/week  OT DURATION: up to 6 more weeks as needed and up to 14 total visits as medically needed  See note below for Objective Data and Assessment of Progress/Goals.        END OF SESSION:  OT End of Session - 01/05/23 0817     Visit Number 9    Number of Visits 14    Date for OT Re-Evaluation 02/20/23    Authorization Type [employer]    OT Start Time [phone]    OT Stop Time [phone]    OT Time Calculation (min) 38 min    Equipment Utilized During Treatment [id number]    Activity Tolerance Patient tolerated treatment well;No increased pain;Patient limited by fatigue    Behavior During Therapy [facility] for tasks assessed/performed               Past Medical History:  Diagnosis Date   Allergy    Seasonal. Taking allergy injections   Anxiety    Asthma    Back pain, chronic    Depression    Depression screen 12/10/2022   Elevated cholesterol with high triglycerides 11/26/2022   Gastroesophageal reflux disease 12/10/2022   Heartburn    Joint pain    Knee pain, chronic    Shortness of  breath    Past Surgical History:  Procedure Laterality Date   HERNIA REPAIR     inguinal hernia repair as infant   NERVE, TENDON AND ARTERY REPAIR Left 07/20/2013   Procedure: NERVE,  REPAIR;  Surgeon: Nicki Reaper, MD;  Location: [facility];  Service: Orthopedics;  Laterality: Left;   thumb surgery Left    Nerve repair   Patient Active Problem List   Diagnosis Date Noted   Vitamin D deficiency 12/30/2022   Other fatigue 12/10/2022   SOBOE (shortness of breath on exertion) 12/10/2022   Gastroesophageal reflux disease 12/10/2022   Depression screen 12/10/2022   BMI 31.0-31.9,adult 12/10/2022   Generalized obesity 12/10/2022   Anxiety and depression 11/26/2022   Class 1 obesity due to excess calories with body mass index (BMI) of 31.0 to 31.9 in adult 11/26/2022   Elevated cholesterol with high triglycerides 11/26/2022   Attention deficit hyperactivity disorder (ADHD) 09/17/2022   IUD contraception 09/17/2022   Acute pain of right shoulder 03/22/2021   Seasonal and perennial allergic rhinoconjunctivitis 11/05/2020   Allergic reaction 07/04/2020   Food allergy 05/02/2020   Environmental allergies 05/02/2020   Dermatographism 05/02/2020   Asthma    Lateral epicondylitis of right elbow 09/26/2015   Subacromial bursitis 08/15/2015    ONSET DATE: July 2024 DOI  REFERRING DIAG: Y78.295 (  ICD-10-CM) - Pain in right forearm   THERAPY DIAG:  Pain in right arm - Plan: Ot plan of care cert/re-cert  Muscle weakness (generalized) - Plan: Ot plan of care cert/re-cert  Paresthesia of skin - Plan: Ot plan of care cert/re-cert  Stiffness of right wrist, not elsewhere classified - Plan: Ot plan of care cert/re-cert  Rationale for Evaluation and Treatment: Rehabilitation  PERTINENT HISTORY: Radial tunnel syndrome program   She arrives late, states she was in a "pushup" position with pressure through her right dorsal forearm and wrist 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 She tried a wrist cock-up prefab for awhile, but was advised to stop by other medical provider as her injury isn't a "wrist sprain."   PRECAUTIONS: None ; RED FLAGS: None ; WEIGHT BEARING RESTRICTIONS: No    SUBJECTIVE:   SUBJECTIVE STATEMENT: She states she has not had anything significantly painful in the past week, has been working and has been doing home exercise program has withheld wrist extension strengthening for now.     PAIN:  Are you having pain?     Yes: NPRS scale: 0.5/10 at rest now, and no exacerbations in past week  Pain description: burning, aching, "lightning" Aggravating factors: motion Relieving factors: rest, ice    PATIENT GOALS: To improve pain levels and increased use of the right dominant hand.    OBJECTIVE: (All objective assessments below are from initial evaluation on: 11/26/22 unless otherwise specified.)   HAND DOMINANCE: Right   ADLs: Overall ADLs: States decreased ability to grab, hold household objects, pain and difficulty to open containers, perform FMS tasks (manipulate fasteners on clothing), mild to moderate bathing problems as well.    FUNCTIONAL OUTCOME MEASURES: 01/05/23: Patient Specific Functional Scale: 5.6 /10  (opening zip locks, writing, open tubes)  (Higher Score  =  Better Ability for the Selected Tasks)     Eval: Patient Specific Functional Scale: 1 /10  (opening zip locks, writing, open tubes)  (Higher Score  =  Better Ability for the Selected Tasks)      UPPER EXTREMITY ROM     Shoulder to Wrist AROM Right eval Rt 12/03/22 Rt  12/22/22 Rt 12/31/22 Rt 01/05/23  Forearm supination 65 61 60 60 69  Forearm pronation  72 83 81 81 92  Wrist flexion 71 47 62 (75* after DN) 84 82  Wrist extension 57 37 55 66 63   (Blank rows = not tested)   Hand AROM Right eval  Full Fist Ability (or Gap to Distal Palmar Crease) full  Thumb Opposition  (Kapandji Scale)  WFL  (Blank rows = not tested)   UPPER EXTREMITY MMT:     MMT Right 12/19/22 Rt 01/05/23  Elbow flexion  5/5 MMT  Elbow extension  5/5 MMT  Forearm supination  4/5 MMT  Forearm pronation  5/5 MMT  Wrist flexion 4/5 tender 5/5 MMT  Wrist extension 4-/5 more tender 4-/5 MMT  Wrist ulnar deviation  5/5 MMT  Wrist radial deviation  5/5 MMT  (Blank rows = not tested)  HAND FUNCTION: 01/05/23: Grip Rt: 53#  12/24/22: Grip Rt: 56#  12/19/22: Grip strength Right:  23 lbs, Left: 68 lbs    COORDINATION: 01/05/23: 9 Hole  Peg Test Right: 24 sec(~20 sec is [facility])   12/08/22: 9 Hole Peg Test Right: 36.5 sec(~20 sec is WFL)   Eval: Observed coordination impairments with affected right hand.  Details when time allows   SENSATION: Eval: Complaints of pain from the arcade approach to the dorsal distal wrist crease, sharp and stabbing at times with supination and wrist extension motions, no paresthesia to tips of fingers  OBSERVATIONS:   01/05/23: She is much less tender now, less painful, still has fairly low endurance and has had some difficulty working into the new strengthening portion of recovery.  Overall she rates her ability much better though and her pain significantly decreased.   TODAY'S TREATMENT:  01/05/23: Pt performs AROM, gripping, and strength with Rt arm,wrist, hand against therapist's resistance for exercise/activities as well as new measures today (see above for these details). OT also discusses home, work and functional tasks with the pt and reviews goals (see goal status below). Using the complied data, OT also reviews home exercises and provides continued recommendations. Pt states understanding and desires to continue therapy as she does not feel that she can manage everything on her own yet.  OT also helps her apply K  tape for herself today which she has some difficulty with but her performance is better after education and trials.  However she decides that she can try self-management for 2 weeks so we will try to decrease the frequency of therapy.   Exercises Reviewed Today:  - Standing Shoulder Posterior Capsule Stretch  - 2-3 x daily - 3-5 reps - 15 hold - Standing neck/upper traps stretch  - 4-6 x daily - 3-5 reps - 15 sec hold - Seated Shoulder Blade Squeeze  - 4-6 x daily - 5-10 reps - Tricep Stretch- DO SEATED BY TABLE  - 3-4 x daily - 3 reps - 15 hold - Seated Wrist Extension Stretch  - 3-6 x daily - 3-5 reps - 15 hold - Wrist Flexion Stretch  - 4 x daily - 3-5 reps - 15 sec hold - Standing Radial Nerve Glide  - 3-4 x daily - 3-5 reps - Towel Roll Grip with Forearm in Neutral  - 3 x daily - 5 reps - 10 sec hold - Seated Eccentric Wrist Extension  - 1-3 x daily - 1-2 sets - 5-10 reps - 10 sec hold - Standing Elbow Extension with Anchored Resistance  - 1-3 x daily - 1-2 sets - 5-10 reps - Standing Bicep Curls with Resistance  - 1-3 x daily - 1-2 sets - 5-10 reps    PATIENT EDUCATION: Education details: See tx section above for details  Person educated: Patient Education method: Verbal Instruction, Teach back, Handouts  Education comprehension: States and demonstrates understanding, Additional Education required    HOME EXERCISE PROGRAM: Access Code: [email or web address] URL: [email or web address] Date: 11/26/2022 Prepared by: Fannie Knee   GOALS: Goals reviewed with patient? Yes   SHORT TERM GOALS: (STG required if POC>30 days) Target Date: 12/12/22  Pt will obtain protective, custom orthotic. Goal status:  MET at eval  2.  Pt will demo/state understanding of initial HEP to improve pain levels and prerequisite motion. Goal status: 01/05/23: MET   LONG TERM GOALS: Target Date: 02/20/23  Pt will improve functional ability by decreased impairment per PSFS assessment from 1  / 10 to 8/10 or better, for better quality of life. Goal status: 01/05/23: Partially Met 5.6 today  2.  Pt will improve grip strength in Rt hand from NT  to at least 40lbs for functional use at home and in IADLs. Goal status: 01/05/23: MET 53# now  3.  Pt will improve A/ROM in Rt wrist ext from 57 to at least 70, to have functional motion for tasks like reach and grasp.  Goal status: 01/05/23: Partially Met 63*   4.  Pt will improve strength in Rt wrist ext from NT and painful to at least 4/5 MMT to have increased functional ability to carry out selfcare and higher-level homecare tasks with less difficulty. Goal status: 01/05/23: Partially Met 4-/5 slight tender  5.  Pt will decrease pain at worst from 7/10 to 3/10 or better to have better sleep and occupational participation in daily roles. Goal status: 01/05/23: Met 3/10 or less   ASSESSMENT:  CLINICAL IMPRESSION: 01/05/23: She is doing well conservatively from the start of care until now.  She has had a couple exacerbations which has slowed down her progress, but largely she seems to understand these precautions now and continues to make slow improvements.  She is working on strengthening cautiously now, and would like to continue therapy on an intermittent basis to help with recommendations, manual therapy if needed, and tracking her status.  She would like to avoid surgery or invasive procedures if possible and this does seem realistic as long as she can continue to prevent exacerbation.   PLAN:  OT FREQUENCY: 1x/week  OT DURATION: up to 6 more weeks (02/20/23) as needed and up to 14 total visits as medically needed  PLANNED INTERVENTIONS: self care/ADL training, therapeutic exercise, therapeutic activity, neuromuscular re-education, manual therapy, gait training, splinting, electrical stimulation, ultrasound, iontophoresis, fluidotherapy, compression bandaging, moist heat, cryotherapy, contrast bath, patient/family education, energy  conservation, coping strategies training, Re-evaluation, and Dry needling   CONSULTED AND AGREED WITH PLAN OF CARE: Patient  PLAN FOR NEXT SESSION:   Follow-up in about 2 weeks (or sooner if needed in case of exacerbation management) to check motion, status, strengthening and perform any manual therapy as helpful.   Fannie Knee, OTR/L , CHT 01/05/2023, 10:45 AM

## 2023-01-03 DIAGNOSIS — K219 Gastro-esophageal reflux disease without esophagitis: Secondary | ICD-10-CM

## 2023-01-05 ENCOUNTER — Ambulatory Visit: Payer: 59 | Admitting: Rehabilitative and Restorative Service Providers"

## 2023-01-05 ENCOUNTER — Encounter: Payer: Self-pay | Admitting: Rehabilitative and Restorative Service Providers"

## 2023-01-05 DIAGNOSIS — M25631 Stiffness of right wrist, not elsewhere classified: Secondary | ICD-10-CM

## 2023-01-05 DIAGNOSIS — M79601 Pain in right arm: Secondary | ICD-10-CM

## 2023-01-05 DIAGNOSIS — M6281 Muscle weakness (generalized): Secondary | ICD-10-CM

## 2023-01-05 DIAGNOSIS — R202 Paresthesia of skin: Secondary | ICD-10-CM

## 2023-01-06 ENCOUNTER — Other Ambulatory Visit (HOSPITAL_COMMUNITY): Payer: Self-pay

## 2023-01-06 ENCOUNTER — Other Ambulatory Visit: Payer: Self-pay

## 2023-01-06 ENCOUNTER — Ambulatory Visit (INDEPENDENT_AMBULATORY_CARE_PROVIDER_SITE_OTHER): Payer: 59 | Admitting: *Deleted

## 2023-01-06 ENCOUNTER — Telehealth (INDEPENDENT_AMBULATORY_CARE_PROVIDER_SITE_OTHER): Payer: 59 | Admitting: Psychology

## 2023-01-06 DIAGNOSIS — F909 Attention-deficit hyperactivity disorder, unspecified type: Secondary | ICD-10-CM

## 2023-01-06 DIAGNOSIS — F419 Anxiety disorder, unspecified: Secondary | ICD-10-CM

## 2023-01-06 DIAGNOSIS — F5089 Other specified eating disorder: Secondary | ICD-10-CM

## 2023-01-06 DIAGNOSIS — J309 Allergic rhinitis, unspecified: Secondary | ICD-10-CM | POA: Diagnosis not present

## 2023-01-06 DIAGNOSIS — F32A Depression, unspecified: Secondary | ICD-10-CM | POA: Diagnosis not present

## 2023-01-06 MED ORDER — FAMOTIDINE 40 MG PO TABS
40.0000 mg | ORAL_TABLET | Freq: Every day | ORAL | 0 refills | Status: DC
  Filled 2023-01-06 – 2023-01-21 (×3): qty 30, 30d supply, fill #0
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
## 2023-01-09 ENCOUNTER — Telehealth: Payer: 59 | Admitting: Family Medicine

## 2023-01-09 DIAGNOSIS — J069 Acute upper respiratory infection, unspecified: Secondary | ICD-10-CM

## 2023-01-09 MED ORDER — IPRATROPIUM BROMIDE 0.03 % NA SOLN
2.0000 | Freq: Two times a day (BID) | NASAL | 0 refills | Status: DC
Start: 2023-01-09 — End: 2023-01-19

## 2023-01-09 MED ORDER — PSEUDOEPH-BROMPHEN-DM 30-2-10 MG/5ML PO SYRP
5.0000 mL | ORAL_SOLUTION | Freq: Four times a day (QID) | ORAL | 0 refills | Status: DC | PRN
Start: 2023-01-09 — End: 2023-01-19

## 2023-01-09 NOTE — Progress Notes (Signed)

## 2023-01-12 ENCOUNTER — Encounter: Payer: Self-pay | Admitting: Rehabilitative and Restorative Service Providers"

## 2023-01-12 ENCOUNTER — Ambulatory Visit (INDEPENDENT_AMBULATORY_CARE_PROVIDER_SITE_OTHER): Payer: 59 | Admitting: Rehabilitative and Restorative Service Providers"

## 2023-01-12 DIAGNOSIS — M25631 Stiffness of right wrist, not elsewhere classified: Secondary | ICD-10-CM | POA: Diagnosis not present

## 2023-01-12 DIAGNOSIS — M6281 Muscle weakness (generalized): Secondary | ICD-10-CM | POA: Diagnosis not present

## 2023-01-12 DIAGNOSIS — M79601 Pain in right arm: Secondary | ICD-10-CM | POA: Diagnosis not present

## 2023-01-12 DIAGNOSIS — R202 Paresthesia of skin: Secondary | ICD-10-CM | POA: Diagnosis not present

## 2023-01-12 NOTE — Therapy (Addendum)
 OUTPATIENT OCCUPATIONAL THERAPY TREATMENT & DISCHARGE NOTE  Patient Name: Susan Boyle MRN: [id number] DOB:1990-11-20, 32 y.o., female Today's Date: 01/12/2023  PCP: Colette Menghini, MD REFERRING PROVIDER:  Genelle Standing, MD             OCCUPATIONAL THERAPY DISCHARGE SUMMARY  Visits from Start of Care: 10  Pt did not return to therapy, so goals could not be addressed.   Pt now officially discharged form OP OT.  See note below for additional details.   Melvenia Ada, OTR/L, CHT 10/22/23           END OF SESSION:  OT End of Session - 01/12/23 1100     Visit Number 10    Number of Visits 14    Date for OT Re-Evaluation 02/20/23    Authorization Type [employer] Employee    OT Start Time 1020    OT Stop Time 1100    OT Time Calculation (min) 40 min    Equipment Utilized During Treatment orthotic materials    Activity Tolerance Patient tolerated treatment well;No increased pain;Patient limited by fatigue    Behavior During Therapy [facility] for tasks assessed/performed                Past Medical History:  Diagnosis Date   Allergy    Seasonal. Taking allergy injections   Anxiety    Asthma    Back pain, chronic    Depression    Depression screen 12/10/2022   Elevated cholesterol with high triglycerides 11/26/2022   Gastroesophageal reflux disease 12/10/2022   Heartburn    Joint pain    Knee pain, chronic    Shortness of breath    Past Surgical History:  Procedure Laterality Date   HERNIA REPAIR     inguinal hernia repair as infant   NERVE, TENDON AND ARTERY REPAIR Left 07/20/2013   Procedure: NERVE,  REPAIR;  Surgeon: Arley JONELLE Curia, MD;  Location: [facility];  Service: Orthopedics;  Laterality: Left;   thumb surgery Left    Nerve repair   Patient Active Problem List   Diagnosis Date Noted   Vitamin D  deficiency 12/30/2022   Other fatigue 12/10/2022   SOBOE (shortness of breath on exertion) 12/10/2022   Gastroesophageal  reflux disease 12/10/2022   Depression screen 12/10/2022   BMI 31.0-31.9,adult 12/10/2022   Generalized obesity 12/10/2022   Anxiety and depression 11/26/2022   Class 1 obesity due to excess calories with body mass index (BMI) of 31.0 to 31.9 in adult 11/26/2022   Elevated cholesterol with high triglycerides 11/26/2022   Attention deficit hyperactivity disorder (ADHD) 09/17/2022   IUD contraception 09/17/2022   Acute pain of right shoulder 03/22/2021   Seasonal and perennial allergic rhinoconjunctivitis 11/05/2020   Allergic reaction 07/04/2020   Food allergy 05/02/2020   Environmental allergies 05/02/2020   Dermatographism 05/02/2020   Asthma    Lateral epicondylitis of right elbow 09/26/2015   Subacromial bursitis 08/15/2015    ONSET DATE: July 2024 DOI  REFERRING DIAG: F20.368 (ICD-10-CM) - Pain in right forearm   THERAPY DIAG:  Muscle weakness (generalized)  Pain in right arm  Stiffness of right wrist, not elsewhere classified  Paresthesia of skin  Rationale for Evaluation and Treatment: Rehabilitation  PERTINENT HISTORY: Radial tunnel syndrome program   She arrives late, states she was in a pushup position with 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 She tried a wrist cock-up prefab for awhile, but was advised to stop by other medical provider as her injury isn't a wrist sprain.   PRECAUTIONS: None ; RED FLAGS: None ; WEIGHT BEARING RESTRICTIONS: No    SUBJECTIVE:   SUBJECTIVE STATEMENT: She states she has been bad recently about doing her home exercises and adhering to nonstressful activities.  She recently took a trip to [place] and lost her orthosis for protection and also has not been doing  stretches much.  Fortunately she has not had much of an exacerbation of pain and continues to do relatively well.     PAIN:  Are you having pain?     Yes: NPRS scale: 1/10 at rest now, and no exacerbations in past week  Pain description: burning, aching, lightning Aggravating factors: motion Relieving factors: rest, ice    PATIENT GOALS: To improve pain levels and increased use of the right dominant hand.    OBJECTIVE: (All objective assessments below are from initial evaluation on: 11/26/22 unless otherwise specified.)   HAND DOMINANCE: Right   ADLs: Overall ADLs: States decreased ability to grab, hold household objects, pain and difficulty to open containers, perform FMS tasks (manipulate fasteners on clothing), mild to moderate bathing problems as well.    FUNCTIONAL OUTCOME MEASURES: 01/05/23: Patient Specific Functional Scale: 5.6 /10  (opening zip locks, writing, open tubes)  (Higher Score  =  Better Ability for the Selected Tasks)     Eval: Patient Specific Functional Scale: 1 /10  (opening zip locks, writing, open tubes)  (Higher Score  =  Better Ability for the Selected Tasks)      UPPER EXTREMITY ROM     Shoulder to Wrist AROM Right eval Rt 12/03/22 Rt  12/22/22 Rt 12/31/22 Rt 01/05/23  Forearm supination 65 61 60 60 69  Forearm pronation  72 83 81 81 92  Wrist flexion 71 47 62 (75* after DN) 84 82  Wrist extension 57 37 55 66 63  (Blank rows = not tested)   Hand AROM Right eval  Full Fist Ability (or Gap to Distal Palmar Crease) full  Thumb Opposition  (Kapandji Scale)  WFL  (Blank rows = not tested)   UPPER EXTREMITY MMT:     MMT Right 12/19/22 Rt 01/05/23  Elbow flexion  5/5 MMT  Elbow extension  5/5 MMT  Forearm supination  4/5 MMT  Forearm pronation  5/5 MMT  Wrist flexion 4/5 tender 5/5 MMT  Wrist extension 4-/5 more tender 4-/5 MMT  Wrist ulnar deviation  5/5 MMT  Wrist radial deviation  5/5 MMT  (Blank rows = not tested)  HAND  FUNCTION: 01/05/23: Grip Rt: 53#  12/24/22: Grip Rt: 56#  12/19/22: Grip strength Right:  23 lbs, Left: 68 lbs    COORDINATION: 01/05/23: 9 Hole Peg Test Right: 24 sec(~20 sec is WFL)   12/08/22: 9 Hole Peg Test Right: 36.5 sec(~20 sec is WFL)   Eval: Observed coordination impairments with affected right hand.  Details when time allows   SENSATION: Eval: Complaints of pain from the arcade approach to the dorsal distal wrist crease, sharp and stabbing at times with supination and wrist extension motions, no paresthesia to tips of fingers  OBSERVATIONS:   01/05/23: She is much less tender now, less painful,  still has fairly low endurance and has had some difficulty working into the new strengthening portion of recovery.  Overall she rates her ability much better though and her pain significantly decreased.   TODAY'S TREATMENT:  01/12/23: Due to her losing her protective orthosis that helps her rest as needed in the day and at night, OT must fabricate a new custom wrist cock up orthosis to rest her extensors and her radial nerve.  OT fabricates this today to immobilize the wrist but leave the fingers and the thumb free.  It fits well, she states is a good fit and understanding how to take it off and on.  She also reviews self-management with OT, avoiding painful activities, we look at her home exercises and she states understanding all of her stretches and also strengthening was looked at.  She states tricep pushes are too painful, so OT advises to totally avoid these until next week.  She should try to do eccentric wrist extension exercises with very light weight as well as bicep curls.  This would be after warm up, stretching.  She states understanding all directions, self-care management, and states the new orthosis is working well and fitting well.      01/05/23: Pt performs AROM, gripping, and strength with Rt arm,wrist, hand against therapist's resistance for exercise/activities as  well as new measures today (see above for these details). OT also discusses home, work and functional tasks with the pt and reviews goals (see goal status below). Using the complied data, OT also reviews home exercises and provides continued recommendations. Pt states understanding and desires to continue therapy as she does not feel that she can manage everything on her own yet.  OT also helps her apply K tape for herself today which she has some difficulty with but her performance is better after education and trials.  However she decides that she can try self-management for 2 weeks so we will try to decrease the frequency of therapy.   Exercises Reviewed Today:  - Standing Shoulder Posterior Capsule Stretch  - 2-3 x daily - 3-5 reps - 15 hold - Standing neck/upper traps stretch  - 4-6 x daily - 3-5 reps - 15 sec hold - Seated Shoulder Blade Squeeze  - 4-6 x daily - 5-10 reps - Tricep Stretch- DO SEATED BY TABLE  - 3-4 x daily - 3 reps - 15 hold - Seated Wrist Extension Stretch  - 3-6 x daily - 3-5 reps - 15 hold - Wrist Flexion Stretch  - 4 x daily - 3-5 reps - 15 sec hold - Standing Radial Nerve Glide  - 3-4 x daily - 3-5 reps - Towel Roll Grip with Forearm in Neutral  - 3 x daily - 5 reps - 10 sec hold - Seated Eccentric Wrist Extension  - 1-3 x daily - 1-2 sets - 5-10 reps - 10 sec hold - Standing Elbow Extension with Anchored Resistance  - 1-3 x daily - 1-2 sets - 5-10 reps - Standing Bicep Curls with Resistance  - 1-3 x daily - 1-2 sets - 5-10 reps    PATIENT EDUCATION: Education details: See tx section above for details  Person educated: Patient Education method: Verbal Instruction, Teach back, Handouts  Education comprehension: States and demonstrates understanding, Additional Education required    HOME EXERCISE PROGRAM: Access Code: [email or web address] URL: [email or web address] Date: 11/26/2022 Prepared by: Melvenia Ada   GOALS: Goals reviewed with patient?  Yes   SHORT TERM GOALS: (STG required if POC>30 days)  Target Date: 12/12/22  Pt will obtain protective, custom orthotic. Goal status:  MET at eval  2.  Pt will demo/state understanding of initial HEP to improve pain levels and prerequisite motion. Goal status: 01/05/23: MET   LONG TERM GOALS: Target Date: 02/20/23  Pt will improve functional ability by decreased impairment per PSFS assessment from 1 / 10 to 8/10 or better, for better quality of life. Goal status: 01/05/23: Partially Met 5.6 today  2.  Pt will improve grip strength in Rt hand from NT to at least 40lbs for functional use at home and in IADLs. Goal status: 01/05/23: MET 53# now  3.  Pt will improve A/ROM in Rt wrist ext from 57 to at least 70, to have functional motion for tasks like reach and grasp.  Goal status: 01/05/23: Partially Met 63*   4.  Pt will improve strength in Rt wrist ext from NT and painful to at least 4/5 MMT to have increased functional ability to carry out selfcare and higher-level homecare tasks with less difficulty. Goal status: 01/05/23: Partially Met 4-/5 slight tender  5.  Pt will decrease pain at worst from 7/10 to 3/10 or better to have better sleep and occupational participation in daily roles. Goal status: 01/05/23: Met 3/10 or less   ASSESSMENT:  CLINICAL IMPRESSION: 01/12/23: Today was mainly about making a new orthosis for her as she lost hers.  Additionally we did review self-care strategies as well as exercises because she had stated slacking off on these things recently.  01/05/23: She is doing well conservatively from the start of care until now.  She has had a couple exacerbations which has slowed down her progress, but largely she seems to understand these precautions now and continues to make slow improvements.  She is working on strengthening cautiously now, and would like to continue therapy on an intermittent basis to help with recommendations, manual therapy if needed, and  tracking her status.  She would like to avoid surgery or invasive procedures if possible and this does seem realistic as long as she can continue to prevent exacerbation.   PLAN:  OT FREQUENCY: 1x/week  OT DURATION: up to 6 more weeks (02/20/23) as needed and up to 14 total visits as medically needed  PLANNED INTERVENTIONS: self care/ADL training, therapeutic exercise, therapeutic activity, neuromuscular re-education, manual therapy, gait training, splinting, electrical stimulation, ultrasound, iontophoresis, fluidotherapy, compression bandaging, moist heat, cryotherapy, contrast bath, patient/family education, energy conservation, coping strategies training, Re-evaluation, and Dry needling   CONSULTED AND AGREED WITH PLAN OF CARE: Patient  PLAN FOR NEXT SESSION:  Follow-up next weeks to check motion, status, strengthening and perform any manual therapy as helpful.   Melvenia Ada, OTR/L , CHT 01/12/2023, 12:17 PM

## 2023-01-16 ENCOUNTER — Other Ambulatory Visit (HOSPITAL_COMMUNITY): Payer: Self-pay

## 2023-01-19 ENCOUNTER — Ambulatory Visit
Admission: EM | Admit: 2023-01-19 | Discharge: 2023-01-19 | Disposition: A | Discharge status: Home / Self Care | Payer: 59 | Attending: Internal Medicine | Admitting: Internal Medicine

## 2023-01-19 ENCOUNTER — Ambulatory Visit: Payer: 59

## 2023-01-19 ENCOUNTER — Encounter: Payer: 59 | Admitting: Rehabilitative and Restorative Service Providers"

## 2023-01-19 DIAGNOSIS — J069 Acute upper respiratory infection, unspecified: Secondary | ICD-10-CM

## 2023-01-19 DIAGNOSIS — R0602 Shortness of breath: Secondary | ICD-10-CM | POA: Diagnosis not present

## 2023-01-19 DIAGNOSIS — J209 Acute bronchitis, unspecified: Secondary | ICD-10-CM | POA: Diagnosis not present

## 2023-01-19 DIAGNOSIS — J45901 Unspecified asthma with (acute) exacerbation: Secondary | ICD-10-CM

## 2023-01-19 DIAGNOSIS — R42 Dizziness and giddiness: Secondary | ICD-10-CM | POA: Diagnosis not present

## 2023-01-19 DIAGNOSIS — R059 Cough, unspecified: Secondary | ICD-10-CM

## 2023-01-19 DIAGNOSIS — R5383 Other fatigue: Secondary | ICD-10-CM | POA: Diagnosis not present

## 2023-01-19 MED ORDER — DOXYCYCLINE HYCLATE 100 MG PO CAPS
100.0000 mg | ORAL_CAPSULE | Freq: Two times a day (BID) | ORAL | 0 refills | Status: DC
Start: 2023-01-19 — End: 2023-01-21

## 2023-01-19 MED ORDER — ALBUTEROL SULFATE (2.5 MG/3ML) 0.083% IN NEBU
2.5000 mg | INHALATION_SOLUTION | Freq: Once | RESPIRATORY_TRACT | Status: AC
  Administered 2023-01-19: 2.5 mg via RESPIRATORY_TRACT

## 2023-01-19 MED ORDER — PSEUDOEPH-BROMPHEN-DM 30-2-10 MG/5ML PO SYRP
5.0000 mL | ORAL_SOLUTION | Freq: Four times a day (QID) | ORAL | 0 refills | Status: DC | PRN
Start: 2023-01-19 — End: 2023-01-19

## 2023-01-19 MED ORDER — PREDNISONE 20 MG PO TABS
40.0000 mg | ORAL_TABLET | Freq: Every day | ORAL | 0 refills | Status: AC
Start: 2023-01-19 — End: 2023-01-24

## 2023-01-19 MED ORDER — FLUTICASONE-SALMETEROL 250-50 MCG/ACT IN AEPB: 1.0000 | INHALATION_SPRAY | Freq: Two times a day (BID) | RESPIRATORY_TRACT | 1 refills | Status: DC

## 2023-01-19 MED ORDER — BENZONATATE 200 MG PO CAPS: 200.0000 mg | ORAL_CAPSULE | Freq: Three times a day (TID) | ORAL | 0 refills | Status: DC | PRN

## 2023-01-19 MED ORDER — ALBUTEROL SULFATE HFA 108 (90 BASE) MCG/ACT IN AERS: 2.0000 | INHALATION_SPRAY | Freq: Four times a day (QID) | RESPIRATORY_TRACT | 2 refills | Status: DC | PRN

## 2023-01-19 NOTE — ED Provider Notes (Signed)
[facility]  Note:  This document was prepared using Dragon voice recognition software and may include unintentional dictation errors.  MRN: [id number] DOB: [birth year] DATE: 01/19/23   Subjective:  Chief Complaint:  Chief Complaint  Patient presents with   Headache     HPI: Susan Boyle is a 32 y.o. female presenting for productive cough and congestion for the past 1.5 weeks. Patient states she started with a runny nose and congestion almost 1.5 weeks ago. She has since developed a persistent productive cough. She reports some shortness of breath as well. She has a history of asthma, but has been out of her Advair inhaler. She reports fatigue and sinus pressure/pain as well. She has been taking OTC tylenol/ibuprofen with no relief. She has also been taking Bromfed which has helped with the cough, but she has since ran out of the medication. Reports no known sick contacts. Denies fever, nausea/vomiting, abdominal pain, sore throat, otalgia. Endorses cough, congestion, sinus pressure/pain, headache, fatigue, chills, arthralgias. Presents NAD.  Prior to Admission medications   Medication Sig Start Date End Date Taking? Authorizing Provider  albuterol (VENTOLIN HFA) 108 (90 Base) MCG/ACT inhaler Inhale 2 puffs into the lungs every 4 (four) hours as needed for wheezing or shortness of breath (coughing fits) 05/27/22   Ellamae Sia, DO  brompheniramine-pseudoephedrine-DM 30-2-10 MG/5ML syrup Take 5 mLs by mouth 4 (four) times daily as needed. 01/09/23   Margaretann Loveless, PA-C  buPROPion (WELLBUTRIN XL) 150 MG 24 hr tablet Take 1 tablet (150 mg total) by mouth at bedtime. 10/01/22     EPINEPHrine (EPIPEN 2-PAK) 0.3 mg/0.3 mL IJ SOAJ injection Inject 0.3 mg into the muscle as needed for anaphylaxis. 12/31/22   Ellamae Sia, DO  EPINEPHrine 0.3 mg/0.3 mL IJ SOAJ injection Inject 0.3 mg into the muscle as needed for anaphylaxis. 07/04/20   Ellamae Sia, DO  famotidine (PEPCID) 40 MG tablet Take 1  tablet (40 mg total) by mouth at bedtime. 01/06/23   Bowen, Scot Jun, DO  fluticasone-salmeterol (ADVAIR DISKUS) 250-50 MCG/ACT AEPB Inhale 1 puff into the lungs in the morning and at bedtime. Rinse mouth after each use. 06/06/21   Nehemiah Settle, FNP  HYDROcodone-acetaminophen (NORCO/VICODIN) 5-325 MG tablet Take 1 tablet by mouth every 6 (six) hours as needed for moderate pain or severe pain. 12/03/22   [name], MD  levonorgestrel (MIRENA) 20 MCG/24HR IUD 1 each by Intrauterine route once.    [provider]  lisdexamfetamine (VYVANSE) 60 MG capsule Take 1 capsule (60 mg total) by mouth every morning. 11/14/22     methylPREDNISolone (MEDROL DOSEPAK) 4 MG TBPK tablet Take per packet instructions 12/26/22   Hazle Nordmann L, PA-C  ondansetron (ZOFRAN-ODT) 4 MG disintegrating tablet Dissolve 1 tablet (4 mg total) in mouth every 8 (eight) hours as needed. 10/13/22   Margaretann Loveless, PA-C  PARoxetine (PAXIL) 20 MG tablet Take 1 tablet (20 mg total) by mouth at bedtime. 10/01/22     traZODone (DESYREL) 50 MG tablet Take 1-3 tablets (50-150 mg total) by mouth at bedtime as needed. 10/01/22     Vitamin D, Ergocalciferol, (DRISDOL) 1.25 MG (50000 UNIT) CAPS capsule Take 1 capsule (50,000 Units total) by mouth every 7 (seven) days. 12/30/22   Bowen, Scot Jun, DO     Allergies  Allergen Reactions   Sulfamethoxazole Nausea And Vomiting    History:   Past Medical History:  Diagnosis Date   Allergy    Seasonal. Taking allergy injections  Anxiety    Asthma    Back pain, chronic    Depression    Depression screen 12/10/2022   Elevated cholesterol with high triglycerides 11/26/2022   Gastroesophageal reflux disease 12/10/2022   Heartburn    Joint pain    Knee pain, chronic    Shortness of breath      Past Surgical History:  Procedure Laterality Date   HERNIA REPAIR     inguinal hernia repair as infant   NERVE, TENDON AND ARTERY REPAIR Left 07/20/2013   Procedure: NERVE,   REPAIR;  Surgeon: Nicki Reaper, MD;  Location:  [facility];  Service: Orthopedics;  Laterality: Left;   thumb surgery Left    Nerve repair    Family History  Adopted: Yes  Problem Relation Age of Onset   Hyperlipidemia Father    Hypertension Father    Heart disease Father 64       MI   Obesity Father    Vision loss Maternal Grandmother    Cancer Maternal Grandfather    Vision loss Maternal Uncle     Social History   Tobacco Use   Smoking status: Every Day    Current packs/day: 0.00    Average packs/day: 0.3 packs/day for 10.0 years (2.5 ttl pk-yrs)    Types: Cigarettes, E-cigarettes    Last attempt to quit: 01/25/2019    Years since quitting: 3.9   Smokeless tobacco: Never   Tobacco comments:    stopped in November 2021   Vaping Use   Vaping status: Every Day   Substances: Nicotine  Substance Use Topics   Alcohol use: Yes    Alcohol/week: 5.0 standard drinks of alcohol    Types: 5 Glasses of wine per week    Comment: social   Drug use: No    Review of Systems  Constitutional:  Positive for chills and fatigue. Negative for fever.  HENT:  Positive for congestion, postnasal drip, rhinorrhea, sinus pressure and sinus pain. Negative for ear pain and sore throat.   Respiratory:  Positive for cough.   Gastrointestinal:  Negative for abdominal pain, nausea and vomiting.  Musculoskeletal:  Positive for arthralgias.  Neurological:  Positive for headaches.     Objective:   Vitals: BP 117/84 (BP Location: Right Arm)   Pulse 90   Temp 98.2 F (36.8 C) (Oral)   Resp 20   LMP  (LMP Unknown)   SpO2 96%   Physical Exam Constitutional:      General: She is not in acute distress.    Appearance: Normal appearance. She is well-developed and overweight. She is not ill-appearing or toxic-appearing.  HENT:     Head: Normocephalic and atraumatic.     Right Ear: Ear canal normal. A middle ear effusion is present.     Left Ear: Ear canal normal. A middle ear  effusion is present.     Nose: Rhinorrhea present. Rhinorrhea is clear.     Right Turbinates: Enlarged.     Left Turbinates: Enlarged.     Right Sinus: Frontal sinus tenderness present.     Left Sinus: Frontal sinus tenderness present.     Mouth/Throat:     Pharynx: Oropharynx is clear. Uvula midline. No pharyngeal swelling, oropharyngeal exudate or posterior oropharyngeal erythema.     Tonsils: No tonsillar exudate or tonsillar abscesses.  Cardiovascular:     Rate and Rhythm: Normal rate and regular rhythm.     Heart sounds: Normal heart sounds.  Pulmonary:  Effort: Pulmonary effort is normal.     Breath sounds: Normal breath sounds.     Comments: Clear to auscultation bilaterally  Abdominal:     General: Bowel sounds are normal.     Palpations: Abdomen is soft.     Tenderness: There is no abdominal tenderness.  Skin:    General: Skin is warm and dry.  Neurological:     General: No focal deficit present.     Mental Status: She is alert.  Psychiatric:        Mood and Affect: Mood and affect normal.     Results:  Labs: No results found for this or any previous visit (from the past 24 hour(s)).  Radiology: No results found.   UC Course/Treatments:  Procedures: Procedures   Medications Ordered in UC: Medications  albuterol (PROVENTIL) (2.5 MG/3ML) 0.083% nebulizer solution 2.5 mg (2.5 mg Nebulization Given 01/19/23 1346)     Assessment and Plan :     ICD-10-CM   1. Acute bronchitis, unspecified organism  J20.9     2. Viral URI with cough  J06.9 DISCONTINUED: brompheniramine-pseudoephedrine-DM 30-2-10 MG/5ML syrup    3. Asthma with acute exacerbation, unspecified asthma severity, unspecified whether persistent  J45.901      Acute Bronchitis, unspecified organism  Afebrile, nontoxic-appearing, NAD. VSS. DDX includes but not limited to: COVID, flu, bronchitis, pneumonia, viral URI COVID and flu not ordered given length of symptoms. Given ongoing symptoms with  worsening of cough, Doxycycline 100mg  BID was prescribed for bronchitis. Benzonatate 200mg  TID PRN was prescribed for cough. Strict ED precautions were given and patient verbalized understanding.  Asthma with acute exacerbation, unspecified asthma severity, unspecified whether persistent  Afebrile, nontoxic-appearing, NAD. VSS. DDX includes but not limited to: asthma, viral URI, bronchitis, pneumonia, COVID Suspect asthma exacerbation triggered by URI. Refill was sent for her albuterol inhaler 2 puffs every 6 hours PRN and Advair inhaler 1 puff BID. She was given a breathing treatment today in office and reported improvement. She was also given a short course of Prednisone to help with exacerbation. Strict ED precautions were given and patient verbalized understanding.  ED Discharge Orders          Ordered    albuterol (VENTOLIN HFA) 108 (90 Base) MCG/ACT inhaler  Every 6 hours PRN        01/19/23 1353    brompheniramine-pseudoephedrine-DM 30-2-10 MG/5ML syrup  4 times daily PRN,   Status:  Discontinued        01/19/23 1355    benzonatate (TESSALON) 200 MG capsule  3 times daily PRN        01/19/23 1356    predniSONE (DELTASONE) 20 MG tablet  Daily with breakfast        01/19/23 1359    doxycycline (VIBRAMYCIN) 100 MG capsule  2 times daily        01/19/23 1400    fluticasone-salmeterol (ADVAIR DISKUS) 250-50 MCG/ACT AEPB  2 times daily        01/19/23 1408             I have reviewed the PDMP during this encounter.     Delora Gravatt, Annabell Howells, PA-C 01/19/23 1410

## 2023-01-19 NOTE — Discharge Instructions (Addendum)
A prescription was sent for Doxycycline. This is an antibiotic used to treat upper respiratory infections. Take as directed.   I have also sent a prescription for an inhaler for you to have at home and a short course of Prednisone. This is a steroid that will help with the shortness of breath.   Finally, I sent you something in for your cough.   Return in 3 to 4 days if no improvement. It is very important for you to pay attention to any new symptoms or worsening of your current condition. Please go directly to the Emergency Department immediately should you begin to have any of the following symptoms: shortness of breath, chest pain or difficulty breathing.

## 2023-01-19 NOTE — ED Triage Notes (Addendum)
Pt c/o Cough, SOB, hot/cold chills, drainage, Fatigue, HA, joint pain, Dizzy, Bones Hurt X 6 Days  Home remedies: Bromfed, Tylenol, Motrin Pt has hx of asthma, is out of maintenance inhaler, did not use rescue inhaler.

## 2023-01-20 NOTE — Progress Notes (Unsigned)
Follow Up Note  RE: Susan Boyle MRN: [id number] DOB: 01-31-1991 Date of Office Visit: 01/21/2023  Referring provider: Suzan Slick, MD Primary care provider: Suzan Slick, MD  Chief Complaint: No chief complaint on file.  History of Present Illness: I had the pleasure of seeing Susan Boyle for a follow up visit at the [facility] on 01/20/2023. She is a 32 y.o. female, who is being followed for asthma, allergic rhinoconjunctivitis and allergic reaction. Her previous allergy office visit was on 06/06/2021 with Nehemiah Settle FNP. Today is a regular follow up visit.  Discussed the use of AI scribe software for clinical note transcription with the patient, who gave verbal consent to proceed.  History of Present Illness            Asthma: Stop Advair 250/50 due to only being able to remember to take dose once a day and poorly controlled asthma Daily controller medication(s): Breo 100 mcg 1 puff once a day to help prevent cough and wheeze.  Rinse mouth after each use.  May use albuterol rescue inhaler 2 puffs every 4 to 6 hours as needed for shortness of breath, chest tightness, coughing, and wheezing. May use albuterol rescue inhaler 2 puffs 5 to 15 minutes prior to strenuous physical activities. Monitor frequency of use.  Asthma control goals:  Full participation in all desired activities (may need albuterol before activity) Albuterol use two times or less a week on average (not counting use with activity) Cough interfering with sleep two times or less a month Oral steroids no more than once a year No hospitalizations   Seasonal and perennial allergic rhinoconjunctivitis 2022 skin testing showed: Positive to grass, weed pollen, ragweed pollen, trees, mold, and borderline to cat and dog.  Continue environmental control measures as below. Stop carbinoxamine 4mg  due to being to drying Start Xyzal (levocetirizine) 5 mg once  a day as needed for runny  nose Continue allergy injections per protocol and have access to epinephrine auto injector device   Allergic reaction: Avoid the clam & corn chowder with bacon canned soup. If you have another reaction then let us know.  For mild symptoms you can take over the counter antihistamines such as Benadryl and monitor symptoms closely. If symptoms worsen or if you have severe symptoms including breathing issues, throat closure, significant swelling, whole body hives, severe diarrhea and vomiting, lightheadedness then inject epinephrine and seek immediate medical care afterwards. Action plan in place.    Asthma Past history - Diagnosed with asthma over 10+ years ago.  Currently using albuterol 1 times a week and frequently forgets to use Advair.  Main triggers include exercise and cold weather. 2022 spirometry showed some restriction with 4% improvement in FEV1 post bronchodilator treatment.  Clinically feeling unchanged. Interim history - doing much better with daily Advair.  Today's spirometry was normal. Daily controller medication(s): Advair 1 puff once a day at night and rinse mouth after each use.  May use albuterol rescue inhaler 2 puffs every 4 to 6 hours as needed for shortness of breath, chest tightness, coughing, and wheezing. May use albuterol rescue inhaler 2 puffs 5 to 15 minutes prior to strenuous physical activities. Monitor frequency of use.  Get spirometry at next visit.   Seasonal and perennial allergic rhinoconjunctivitis Past history - Perennial rhinoconjunctivitis symptoms for many years.  Tried Zyrtec, Claritin Allegra with no benefit.  Nasacort and Flonase causes headaches.  Skin testing as a child showed positive  to multiple items per patient report and was on allergy immunotherapy for 2 years. 2022 skin testing showed: Positive to grass, weed pollen, ragweed pollen, trees, mold, and borderline to cat and dog.  Interim history - carbinoxamine helps.  Continue  environmental control measures as below. May use carbinoxamine 4mg  (1 to 1.5 tablet) twice a day as needed for allergies. Consider allergy injections for long term control if above medications do not help the symptoms - handout given.  Let us know when ready to start.  May use OTC eye drops as needed for itchy/watery eyes.   Allergic reaction Past history - 1 episode of allergic reaction in 2018 after eating a chunky clam and corn chowder canned soup at night.  The following morning developed left/facial swelling which eventually progressed to pruritus and hand/feet swelling.  She was treated in ER with epinephrine, Zantac, Benadryl and steroids with good benefit.  Patient has eaten all the ingredients that are in the canned soup since then without any issues and is currently not avoiding any foods.  No more additional allergic reaction since that event. Interim history - no reactions since the last visit. Avoid the clam & corn chowder with bacon canned soup. If you have another reaction then let us know.  For mild symptoms you can take over the counter antihistamines such as Benadryl and monitor symptoms closely. If symptoms worsen or if you have severe symptoms including breathing issues, throat closure, significant swelling, whole body hives, severe diarrhea and vomiting, lightheadedness then inject epinephrine and seek immediate medical care afterwards. Action plan in place.   01/19/2023 UC visit: "Acute Bronchitis, unspecified organism  Afebrile, nontoxic-appearing, NAD. VSS. DDX includes but not limited to: COVID, flu, bronchitis, pneumonia, viral URI COVID and flu not ordered given length of symptoms. Given ongoing symptoms with worsening of cough, Doxycycline 100mg  BID was prescribed for bronchitis. Benzonatate 200mg  TID PRN was prescribed for cough. Strict ED precautions were given and patient verbalized understanding.   Asthma with acute exacerbation, unspecified asthma severity,  unspecified whether persistent  Afebrile, nontoxic-appearing, NAD. VSS. DDX includes but not limited to: asthma, viral URI, bronchitis, pneumonia, COVID Suspect asthma exacerbation triggered by URI. Refill was sent for her albuterol inhaler 2 puffs every 6 hours PRN and Advair inhaler 1 puff BID. She was given a breathing treatment today in office and reported improvement. She was also given a short course of Prednisone to help with exacerbation. Strict ED precautions were given and patient verbalized understanding."  Assessment and Plan: Susan Boyle is a 32 y.o. female with: *** Assessment and Plan              No follow-ups on file.  No orders of the defined types were placed in this encounter.  Lab Orders  No laboratory test(s) ordered today    Diagnostics: Spirometry:  Tracings reviewed. Her effort: {Blank single:19197::"Good reproducible efforts.","It was hard to get consistent efforts and there is a question as to whether this reflects a maximal maneuver.","Poor effort, data can not be interpreted."} FVC: ***L FEV1: ***L, ***% predicted FEV1/FVC ratio: ***% Interpretation: {Blank single:19197::"Spirometry consistent with mild obstructive disease","Spirometry consistent with moderate obstructive disease","Spirometry consistent with severe obstructive disease","Spirometry consistent with possible restrictive disease","Spirometry consistent with mixed obstructive and restrictive disease","Spirometry uninterpretable due to technique","Spirometry consistent with normal pattern","No overt abnormalities noted given today's efforts"}.  Please see scanned spirometry results for details.  Skin Testing: {Blank single:19197::"Select foods","Environmental allergy panel","Environmental allergy panel and select foods","Food allergy panel","None","Deferred due to recent antihistamines use"}. ***  Results discussed with patient/family.   Medication List:  Current Outpatient Medications   Medication Sig Dispense Refill   albuterol (VENTOLIN HFA) 108 (90 Base) MCG/ACT inhaler Inhale 2 puffs into the lungs every 6 (six) hours as needed for shortness of breath or wheezing. 18 g 2   benzonatate (TESSALON) 200 MG capsule Take 1 capsule (200 mg total) by mouth 3 (three) times daily as needed. 15 capsule 0   buPROPion (WELLBUTRIN XL) 150 MG 24 hr tablet Take 1 tablet (150 mg total) by mouth at bedtime. 90 tablet 3   doxycycline (VIBRAMYCIN) 100 MG capsule Take 1 capsule (100 mg total) by mouth 2 (two) times daily for 10 days. 20 capsule 0   EPINEPHrine (EPIPEN 2-PAK) 0.3 mg/0.3 mL IJ SOAJ injection Inject 0.3 mg into the muscle as needed for anaphylaxis. 2 each 1   EPINEPHrine 0.3 mg/0.3 mL IJ SOAJ injection Inject 0.3 mg into the muscle as needed for anaphylaxis. 2 each 2   famotidine (PEPCID) 40 MG tablet Take 1 tablet (40 mg total) by mouth at bedtime. 30 tablet 0   fluticasone-salmeterol (ADVAIR DISKUS) 250-50 MCG/ACT AEPB Inhale 1 puff into the lungs in the morning and at bedtime. Rinse mouth after each use. 60 each 1   levonorgestrel (MIRENA) 20 MCG/24HR IUD 1 each by Intrauterine route once.     lisdexamfetamine (VYVANSE) 60 MG capsule Take 1 capsule (60 mg total) by mouth every morning. 90 capsule 0   PARoxetine (PAXIL) 20 MG tablet Take 1 tablet (20 mg total) by mouth at bedtime. 90 tablet 3   predniSONE (DELTASONE) 20 MG tablet Take 2 tablets (40 mg total) by mouth daily with breakfast for 5 days. 10 tablet 0   traZODone (DESYREL) 50 MG tablet Take 1-3 tablets (50-150 mg total) by mouth at bedtime as needed. 270 tablet 3   Vitamin D, Ergocalciferol, (DRISDOL) 1.25 MG (50000 UNIT) CAPS capsule Take 1 capsule (50,000 Units total) by mouth every 7 (seven) days. 5 capsule 0   No current facility-administered medications for this visit.   Allergies: Allergies  Allergen Reactions   Sulfamethoxazole Nausea And Vomiting   I reviewed her past medical history, social history, family  history, and environmental history and no significant changes have been reported from her previous visit.  Review of Systems  Constitutional:  Negative for appetite change, chills, fever and unexpected weight change.  HENT:  Negative for congestion and rhinorrhea.   Eyes:  Negative for itching.  Respiratory:  Negative for cough, chest tightness, shortness of breath and wheezing.   Cardiovascular:  Negative for chest pain.  Gastrointestinal:  Negative for abdominal pain.  Genitourinary:  Negative for difficulty urinating.  Skin:  Negative for rash.  Allergic/Immunologic: Positive for environmental allergies.  Neurological:  Negative for headaches.    Objective: LMP  (LMP Unknown)  There is no height or weight on file to calculate BMI. Physical Exam Vitals and nursing note reviewed.  Constitutional:      Appearance: Normal appearance. She is well-developed.  HENT:     Head: Normocephalic and atraumatic.     Right Ear: Tympanic membrane and external ear normal.     Left Ear: Tympanic membrane and external ear normal.     Nose: Nose normal.     Mouth/Throat:     Mouth: Mucous membranes are moist.     Pharynx: Oropharynx is clear.  Eyes:     Conjunctiva/sclera: Conjunctivae normal.  Cardiovascular:     Rate and Rhythm: Normal rate  and regular rhythm.     Heart sounds: Normal heart sounds. No murmur heard.    No friction rub. No gallop.  Pulmonary:     Effort: Pulmonary effort is normal.     Breath sounds: Normal breath sounds. No wheezing, rhonchi or rales.  Musculoskeletal:     Cervical back: Neck supple.  Skin:    General: Skin is warm.     Findings: No rash.  Neurological:     Mental Status: She is alert and oriented to person, place, and time.  Psychiatric:        Behavior: Behavior normal.    Previous notes and tests were reviewed. The plan was reviewed with the patient/family, and all questions/concerned were addressed.  It was my pleasure to see Susan Boyle today and  participate in her care. Please feel free to contact me with any questions or concerns.  Sincerely,  Wyline Mood, DO Allergy & Immunology  [facility] office: [phone] [facility] office: [phone]

## 2023-01-21 ENCOUNTER — Ambulatory Visit: Payer: 59 | Admitting: Allergy

## 2023-01-21 ENCOUNTER — Encounter: Payer: Self-pay | Admitting: Allergy

## 2023-01-21 ENCOUNTER — Other Ambulatory Visit: Payer: Self-pay

## 2023-01-21 ENCOUNTER — Other Ambulatory Visit (HOSPITAL_COMMUNITY): Payer: Self-pay

## 2023-01-21 VITALS — BP 110/76 | HR 84 | Temp 98.1°F | Ht 63.19 in | Wt 185.8 lb

## 2023-01-21 DIAGNOSIS — J302 Other seasonal allergic rhinitis: Secondary | ICD-10-CM

## 2023-01-21 DIAGNOSIS — J4540 Moderate persistent asthma, uncomplicated: Secondary | ICD-10-CM

## 2023-01-21 DIAGNOSIS — H1010 Acute atopic conjunctivitis, unspecified eye: Secondary | ICD-10-CM | POA: Diagnosis not present

## 2023-01-21 DIAGNOSIS — J3089 Other allergic rhinitis: Secondary | ICD-10-CM | POA: Diagnosis not present

## 2023-01-21 DIAGNOSIS — J069 Acute upper respiratory infection, unspecified: Secondary | ICD-10-CM | POA: Diagnosis not present

## 2023-01-21 DIAGNOSIS — T7840XD Allergy, unspecified, subsequent encounter: Secondary | ICD-10-CM | POA: Diagnosis not present

## 2023-01-21 MED ORDER — FLUTICASONE PROPIONATE 50 MCG/ACT NA SUSP
1.0000 | Freq: Every day | NASAL | 5 refills | Status: AC | PRN
Start: 2023-01-21 — End: ?
  Filled 2023-01-21: qty 16, 30d supply, fill #0

## 2023-01-21 MED ORDER — FLUTICASONE FUROATE-VILANTEROL 100-25 MCG/ACT IN AEPB
1.0000 | INHALATION_SPRAY | Freq: Every day | RESPIRATORY_TRACT | 3 refills | Status: DC
  Filled 2023-01-21: qty 60, 30d supply, fill #0

## 2023-01-21 MED ORDER — AZITHROMYCIN 250 MG PO TABS
ORAL_TABLET | ORAL | 0 refills | Status: AC
  Filled 2023-01-21: qty 6, 5d supply, fill #0

## 2023-01-21 MED ORDER — ALBUTEROL SULFATE (2.5 MG/3ML) 0.083% IN NEBU
2.5000 mg | INHALATION_SOLUTION | RESPIRATORY_TRACT | 1 refills | Status: DC | PRN
  Filled 2023-01-21: qty 90, 5d supply, fill #0

## 2023-01-21 MED ORDER — CROMOLYN SODIUM 4 % OP SOLN
1.0000 [drp] | Freq: Four times a day (QID) | OPHTHALMIC | 1 refills | Status: DC | PRN
  Filled 2023-01-21: qty 10, 25d supply, fill #0
  Filled 2023-01-21: qty 10, 50d supply, fill #0

## 2023-01-21 MED ORDER — ALBUTEROL SULFATE HFA 108 (90 BASE) MCG/ACT IN AERS
2.0000 | INHALATION_SPRAY | RESPIRATORY_TRACT | 1 refills | Status: AC | PRN
  Filled 2023-01-21 – 2023-07-04 (×2): qty 6.7, 25d supply, fill #0
  Filled 2023-12-23: qty 6.7, 25d supply, fill #1

## 2023-01-21 NOTE — Patient Instructions (Addendum)
Start Zpak. Take with food and take probiotics.  Stop doxycycline. Continue prednisone 20mg  daily.  Use albuterol nebulizer twice a day for the next 1 week. See below for symptomatic management.   Asthma Spacer given and demonstrated proper use with inhaler. Patient understood technique and all questions/concerned were addressed.  Nebulizer machine given.   Daily controller medication(s): START Breo 1 puff once a day and rinse mouth after each use. This replaces Advair. Let us know if it's not covered.  During respiratory infections/flares:  Pretreat with albuterol 2 puffs or albuterol nebulizer.  If you need to use your albuterol nebulizer machine back to back within 15-30 minutes with no relief then please go to the ER/urgent care for further evaluation.  May use albuterol rescue inhaler 2 puffs or nebulizer every 4 to 6 hours as needed for shortness of breath, chest tightness, coughing, and wheezing. May use albuterol rescue inhaler 2 puffs 5 to 15 minutes prior to strenuous physical activities. Monitor frequency of use - if you need to use it more than twice per week on a consistent basis let us know.  Breathing control goals:  Full participation in all desired activities (may need albuterol before activity) Albuterol use two times or less a week on average (not counting use with activity) Cough interfering with sleep two times or less a month Oral steroids no more than once a year No hospitalizations   Seasonal and perennial allergic rhinoconjunctivitis 2022 skin testing positive to grass, weed pollen, ragweed pollen, trees, mold, and borderline to cat and dog.  Continue environmental control measures.  Continue allergy injections - skip while you are sick. Use over the counter antihistamines such as Zyrtec (cetirizine), Claritin (loratadine), Allegra (fexofenadine), or Xyzal (levocetirizine) daily as needed. May switch antihistamines every few months. Use cromolyn 4% 1  drop in each eye up to four times a day as needed for itchy/watery eyes.  Use Flonase (fluticasone) nasal spray 1-2 sprays per nostril once a day as needed for nasal congestion.  Nasal saline spray (i.e., Simply Saline) or nasal saline lavage (i.e., NeilMed) is recommended as needed and prior to medicated nasal sprays.  Skin  See below for proper skin care. Use fragrance free and dye free products. No dryer sheets or fabric softener.    Allergic reaction Avoid the clam & corn chowder with bacon canned soup. If you have another reaction then let us know.  For mild symptoms you can take over the counter antihistamines such as Benadryl and monitor symptoms closely. If symptoms worsen or if you have severe symptoms including breathing issues, throat closure, significant swelling, whole body hives, severe diarrhea and vomiting, lightheadedness then inject epinephrine and seek immediate medical care afterwards. Action plan in place.   Follow up in 3 months or sooner if needed.  Drink plenty of fluids. Water, juice, clear broth or warm lemon water are good choices. Avoid caffeine and alcohol, which can dehydrate you. Eat chicken soup. Chicken soup and other warm fluids can be soothing and loosen congestion. Rest. Adjust your room's temperature and humidity. Keep your room warm but not overheated. If the air is dry, a cool-mist humidifier or vaporizer can moisten the air and help ease congestion and coughing. Keep the humidifier clean to prevent the growth of bacteria and molds. Soothe your throat. Perform a saltwater gargle. Dissolve one-quarter to a half teaspoon of salt in a 4- to 8-ounce glass of warm water. This can relieve a sore or scratchy throat temporarily. Use saline  nasal drops. To help relieve nasal congestion, try saline nasal drops. You can buy these drops over the counter, and they can help relieve symptoms ? even in children. Take over-the-counter cold and cough medications. For  adults and children older than 5, over-the-counter decongestants, antihistamines and pain relievers might offer some symptom relief. However, they won't prevent a cold or shorten its duration.   Skin care recommendations  Bath time: Always use lukewarm water. AVOID very hot or cold water. Keep bathing time to 5-10 minutes. Do NOT use bubble bath. Use a mild soap and use just enough to wash the dirty areas. Do NOT scrub skin vigorously.  After bathing, pat dry your skin with a towel. Do NOT rub or scrub the skin.  Moisturizers and prescriptions:  ALWAYS apply moisturizers immediately after bathing (within 3 minutes). This helps to lock-in moisture. Use the moisturizer several times a day over the whole body. Good summer moisturizers include: Aveeno, CeraVe, Cetaphil. Good winter moisturizers include: Aquaphor, Vaseline, Cerave, Cetaphil, Eucerin, Vanicream. When using moisturizers along with medications, the moisturizer should be applied about one hour after applying the medication to prevent diluting effect of the medication or moisturize around where you applied the medications. When not using medications, the moisturizer can be continued twice daily as maintenance.  Laundry and clothing: Avoid laundry products with added color or perfumes. Use unscented hypo-allergenic laundry products such as Tide free, Cheer free & gentle, and All free and clear.  If the skin still seems dry or sensitive, you can try double-rinsing the clothes. Avoid tight or scratchy clothing such as wool. Do not use fabric softeners or dyer sheets.

## 2023-01-24 ENCOUNTER — Other Ambulatory Visit (HOSPITAL_COMMUNITY): Payer: Self-pay

## 2023-01-26 ENCOUNTER — Other Ambulatory Visit: Payer: Self-pay | Admitting: *Deleted

## 2023-01-26 ENCOUNTER — Encounter: Payer: Self-pay | Admitting: Family Medicine

## 2023-01-26 ENCOUNTER — Other Ambulatory Visit (HOSPITAL_COMMUNITY): Payer: Self-pay

## 2023-01-26 ENCOUNTER — Ambulatory Visit: Payer: 59 | Admitting: Family Medicine

## 2023-01-26 VITALS — BP 124/84 | HR 76 | Temp 97.9°F | Ht 63.0 in | Wt 183.0 lb

## 2023-01-26 DIAGNOSIS — Z6832 Body mass index (BMI) 32.0-32.9, adult: Secondary | ICD-10-CM | POA: Diagnosis not present

## 2023-01-26 DIAGNOSIS — E66811 Obesity, class 1: Secondary | ICD-10-CM | POA: Diagnosis not present

## 2023-01-26 DIAGNOSIS — F5089 Other specified eating disorder: Secondary | ICD-10-CM

## 2023-01-26 DIAGNOSIS — F32A Depression, unspecified: Secondary | ICD-10-CM

## 2023-01-26 DIAGNOSIS — F419 Anxiety disorder, unspecified: Secondary | ICD-10-CM

## 2023-01-26 DIAGNOSIS — E559 Vitamin D deficiency, unspecified: Secondary | ICD-10-CM

## 2023-01-26 DIAGNOSIS — E6609 Other obesity due to excess calories: Secondary | ICD-10-CM

## 2023-01-26 MED ORDER — VITAMIN D (ERGOCALCIFEROL) 1.25 MG (50000 UNIT) PO CAPS
50000.0000 [IU] | ORAL_CAPSULE | ORAL | 0 refills | Status: DC
  Filled 2023-01-26 – 2023-01-30 (×3): qty 5, 35d supply, fill #0

## 2023-01-26 MED ORDER — FLUTICASONE-SALMETEROL 250-50 MCG/ACT IN AEPB
1.0000 | INHALATION_SPRAY | Freq: Two times a day (BID) | RESPIRATORY_TRACT | 5 refills | Status: AC
  Filled 2023-01-26 – 2023-05-01 (×4): qty 60, 30d supply, fill #0
  Filled 2023-06-26: qty 60, 30d supply, fill #1

## 2023-01-26 NOTE — Assessment & Plan Note (Signed)
She has started CBT with Dr Dewaine Conger Stress levels remain high but she has a good support system at home  Continue CBT with Dr Dewaine Conger, practicing mindful eating Keep junk food trigger foods out of the house

## 2023-01-26 NOTE — Assessment & Plan Note (Signed)
Managed by Dr Evelene Croon, taking Paxil 20 mg daily and Wellbutrin XL 150 mg daily. Continues to struggle with high levels of anxiety, poor sleep and emotional eating.  Explained to patient that actively working on behavior changes for weight loss is dependent on underlying mood disorder. Continue plan of care per Dr Evelene Croon Avoid use of Phentermine, Qsymia, Contrave due to mood disorder and current medications GLP-1 receptot agonists are not covered by her insurance

## 2023-01-26 NOTE — Progress Notes (Signed)
Office: [phone]  /  Fax: [phone]  WEIGHT SUMMARY AND BIOMETRICS  Starting Date: 12/10/22  Starting Weight: 180lb   Weight Lost Since Last Visit: 0lb   Vitals Temp: 97.9 F (36.6 C) BP: 124/84 Pulse Rate: 76 SpO2: 99 %   Body Composition  Body Fat %: 41.9 % Fat Mass (lbs): 76.9 lbs Muscle Mass (lbs): 101 lbs Total Body Water (lbs): 71 lbs Visceral Fat Rating : 8   HPI  Chief Complaint: OBESITY  Susan Boyle is here to discuss her progress with her obesity treatment plan. She is on the the Category 2 Plan and states she is following her eating plan approximately 60 % of the time. She states she is exercising 60 minutes 2 times per week.   Interval History:  Since last office visit she is up 6 lb She was sick with pneumonia and an asthma exacerbation She did require oral steroids and antibiotics She has been through her birthday and celebrations and admits to being off track She has a net weight gain of 3 lb in the past month She has not been mindful of her food choices or portions She is eating more greek yogurt but struggles to meal plan  Pharmacotherapy: none  PHYSICAL EXAM:  Blood pressure 124/84, pulse 76, temperature 97.9 F (36.6 C), height 5\' 3"  (1.6 m), weight 183 lb (83 kg), SpO2 99%. Body mass index is 32.42 kg/m.  General: She is overweight, cooperative, alert, well developed, and in no acute distress. PSYCH: Has normal mood, affect and thought process.   Lungs: Normal breathing effort, no conversational dyspnea.   ASSESSMENT AND PLAN  TREATMENT PLAN FOR OBESITY:  Recommended Dietary Goals  [name] is currently in the action stage of change. As such, her goal is to continue weight management plan. She has agreed to the Category 2 Plan.  Behavioral Intervention  We discussed the following Behavioral Modification Strategies today: increasing lean protein intake to established goals, increasing vegetables, increasing lower glycemic  fruits, avoiding skipping meals, increasing water intake , work on meal planning and preparation, keeping healthy foods at home, work on managing stress, creating time for self-care and relaxation, avoiding temptations and identifying enticing environmental cues, planning for success, and continue to work on maintaining a reduced calorie state, getting the recommended amount of protein, incorporating whole foods, making healthy choices, staying well hydrated and practicing mindfulness when eating..  Additional resources provided today: NA  Recommended Physical Activity Goals  [name] has been advised to work up to 150 minutes of moderate intensity aerobic activity a week and strengthening exercises 2-3 times per week for cardiovascular health, weight loss maintenance and preservation of muscle mass.   She has agreed to Think about enjoyable ways to increase daily physical activity and overcoming barriers to exercise and Increase physical activity in their day and reduce sedentary time (increase NEAT).  Pharmacotherapy changes for the treatment of obesity: none  ASSOCIATED CONDITIONS ADDRESSED TODAY  Vitamin D deficiency Assessment & Plan: Last vitamin D Lab Results  Component Value Date   VD25OH 13.4 (L) 12/10/2022   She is taking RX vitamin D weekly Energy level is unchanged  Repeat lab in Feb  Orders: -     Vitamin D (Ergocalciferol); Take 1 capsule (50,000 Units total) by mouth every 7 (seven) days.  Dispense: 5 capsule; Refill: 0  Class 1 obesity due to excess calories with body mass index (BMI) of 32.0 to 32.9 in adult, unspecified whether serious comorbidity present  Other Specified Feeding  or Eating Disorder, Emotional Eating Behaviors Assessment & Plan: She has started CBT with Dr Dewaine Conger Stress levels remain high but she has a good support system at home  Continue CBT with Dr Dewaine Conger, practicing mindful eating Keep junk food trigger foods out of the house    Anxiety and  depression Assessment & Plan: Managed by Dr Evelene Croon, taking Paxil 20 mg daily and Wellbutrin XL 150 mg daily. Continues to struggle with high levels of anxiety, poor sleep and emotional eating.  Explained to patient that actively working on behavior changes for weight loss is dependent on underlying mood disorder. Continue plan of care per Dr Evelene Croon Avoid use of Phentermine, Qsymia, Contrave due to mood disorder and current medications GLP-1 receptot agonists are not covered by her insurance       She was informed of the importance of frequent follow up visits to maximize her success with intensive lifestyle modifications for her multiple health conditions.   ATTESTASTION STATEMENTS:  Reviewed by clinician on day of visit: allergies, medications, problem list, medical history, surgical history, family history, social history, and previous encounter notes pertinent to obesity diagnosis.   I have personally spent 30 minutes total time today in preparation, patient care, nutritional counseling and documentation for this visit, including the following: review of clinical lab tests; review of medical tests/procedures/services.      Glennis Brink, DO DABFM, DABOM [facility] [address] [place], [place] [address] [phone]

## 2023-01-26 NOTE — Assessment & Plan Note (Signed)
Last vitamin D Lab Results  Component Value Date   VD25OH 13.4 (L) 12/10/2022   She is taking RX vitamin D weekly Energy level is unchanged  Repeat lab in Feb

## 2023-01-27 ENCOUNTER — Other Ambulatory Visit: Payer: Self-pay

## 2023-01-28 ENCOUNTER — Ambulatory Visit: Payer: 59 | Admitting: Orthopedic Surgery

## 2023-01-28 DIAGNOSIS — G5630 Lesion of radial nerve, unspecified upper limb: Secondary | ICD-10-CM

## 2023-01-28 NOTE — Progress Notes (Signed)
Susan Boyle - 32 y.o. female MRN [id number]  Date of birth: April 06, 1990  Office Visit Note: Visit Date: 01/28/2023 PCP: Suzan Slick, MD Referred by: Huel Cote, MD  Subjective: No chief complaint on file.  HPI: Susan Boyle is a pleasant 32 y.o. female who presents today for evaluation of ongoing right forearm pain that has been present now for approximately 6 months.  Pain started as part of a repetitive task she was performing as part of her job, then progressed to ongoing pain even at rest.  She was diagnosed with radial tunnel syndrome, has undergone treatment now with extensive therapy, bracing, NSAIDs as well as ultrasound-guided hydrodissection with associated injection of the radial tunnel.  She states that the injection did provide her temporary relief.  Currently, her pain is improving at rest.  She states that it does become aggravated with weight-based activity, particularly with wrist extension involved.  Denies any significant lateral elbow pain.  Denies any numbness or tingling.  Pertinent ROS were reviewed with the patient and found to be negative unless otherwise specified above in HPI.   Visit Reason: right forearm, radial tunnel evaluation Duration of symptoms: July 2024 Hand dominance: right Occupation: Pharmacy Tech Diabetic: No Smoking: Yes Heart/Lung History: asthma Blood Thinners: none  Prior Testing/EMG: xrays 11/15/22 Injections (Date): Ultrasound-guided October 2024 Treatments: injection, splint, therapy, NSAIDs Prior Surgery: none  Assessment & Plan: Visit Diagnoses: No diagnosis found.  Plan: Extensive discussion was had the patient today regarding her right forearm radial tunnel syndrome.  I am in agreement with her prior workup that she does demonstrate clinical evidence of this diagnosis.  I discussed the etiology and pathophysiology of radial tunnel syndrome as well as the treatment pathway.  She has been appropriately worked up  and is currently in the midst of active treatment with ongoing bracing, NSAIDs and therapy.  I am also pleased to see that she did have some relief, albeit temporary from the ultrasound-guided injection, as this has both diagnostic and therapeutic value.  We discussed surgical options, particular radial tunnel release as well as all risks and benefits associated.  I did emphasize that her condition does tend to improve with conservative modalities, and we usually exhaust these conservative modalities for up to 1 year to allow for ongoing improvement, which she is experiencing.  Given that she is experiencing ongoing improvement with conservative treatment, we will continue along this treatment pathway for the time being.  She is welcome return to me in the future for repeat evaluation, I did recommend that should her symptoms persist greater than this 1 year time interval, at that juncture it would be appropriate to further discuss surgery.  She expressed full understanding.  Greater than 30 minutes was spent in the care of this patient today, reviewing previous documentation, imaging as well as in examination and discussion regarding treatment.  Follow-up: No follow-ups on file.   Meds & Orders: No orders of the defined types were placed in this encounter.  No orders of the defined types were placed in this encounter.    Procedures: No procedures performed      Clinical History: No specialty comments available.  She reports that she has been smoking cigarettes and e-cigarettes. She has a 2.5 pack-year smoking history. She has never used smokeless tobacco.  Recent Labs    09/17/22 1449 12/10/22 0849  HGBA1C 5.2 5.1    Objective:   Vital Signs: LMP  (LMP Unknown)   Physical Exam  Gen: Well-appearing, in no acute distress; non-toxic CV: Regular Rate. Well-perfused. Warm.  Resp: Breathing unlabored on room air; no wheezing. Psych: Fluid speech in conversation; appropriate affect; normal  thought process  Ortho Exam Right upper extremity: - There is tenderness associated over the dorsal forearm region, consistent at the level of the supinator arch, approximately 3 cm distal to the lateral epicondyle - No significant tenderness over the lateral epicondyle - Pain with resisted wrist extension and long finger extension, passive pronation with wrist flexion does produce pain at the radial tunnel as well - Sensory is intact distally, able to perform thumb retropulsion without notable weakness - Full range of motion at the elbow and wrist/hand without restriction  Imaging: No results found.  Past Medical/Family/Surgical/Social History: Medications & Allergies reviewed per EMR, new medications updated. Patient Active Problem List   Diagnosis Date Noted   Other Specified Feeding or Eating Disorder, Emotional Eating Behaviors 01/26/2023   Vitamin D deficiency 12/30/2022   Other fatigue 12/10/2022   SOBOE (shortness of breath on exertion) 12/10/2022   Gastroesophageal reflux disease 12/10/2022   Depression screen 12/10/2022   BMI 31.0-31.9,adult 12/10/2022   Generalized obesity 12/10/2022   Anxiety and depression 11/26/2022   Class 1 obesity due to excess calories with body mass index (BMI) of 31.0 to 31.9 in adult 11/26/2022   Elevated cholesterol with high triglycerides 11/26/2022   Attention deficit hyperactivity disorder (ADHD) 09/17/2022   IUD contraception 09/17/2022   Acute pain of right shoulder 03/22/2021   Seasonal and perennial allergic rhinoconjunctivitis 11/05/2020   Allergic reaction 07/04/2020   Food allergy 05/02/2020   Environmental allergies 05/02/2020   Dermatographism 05/02/2020   Asthma    Lateral epicondylitis of right elbow 09/26/2015   Subacromial bursitis 08/15/2015   Past Medical History:  Diagnosis Date   Allergy    Seasonal. Taking allergy injections   Anxiety    Asthma    Back pain, chronic    Depression    Depression screen  12/10/2022   Elevated cholesterol with high triglycerides 11/26/2022   Gastroesophageal reflux disease 12/10/2022   Heartburn    Joint pain    Knee pain, chronic    Shortness of breath    Family History  Adopted: Yes  Problem Relation Age of Onset   Hyperlipidemia Father    Hypertension Father    Heart disease Father 76       MI   Obesity Father    Vision loss Maternal Grandmother    Cancer Maternal Grandfather    Vision loss Maternal Uncle    Past Surgical History:  Procedure Laterality Date   HERNIA REPAIR     inguinal hernia repair as infant   NERVE, TENDON AND ARTERY REPAIR Left 07/20/2013   Procedure: NERVE,  REPAIR;  Surgeon: Nicki Reaper, MD;  Location: [facility];  Service: Orthopedics;  Laterality: Left;   thumb surgery Left    Nerve repair   Social History   Occupational History   Not on file  Tobacco Use   Smoking status: Every Day    Current packs/day: 0.00    Average packs/day: 0.3 packs/day for 10.0 years (2.5 ttl pk-yrs)    Types: Cigarettes, E-cigarettes    Last attempt to quit: 01/25/2019    Years since quitting: 4.0   Smokeless tobacco: Never   Tobacco comments:    stopped in November 2021   Vaping Use   Vaping status: Every Day   Substances: Nicotine  Substance and Sexual  Activity   Alcohol use: Yes    Alcohol/week: 5.0 standard drinks of alcohol    Types: 5 Glasses of wine per week    Comment: social   Drug use: No   Sexual activity: Yes    Birth control/protection: I.U.D.    Lior Cartelli Trevor Mace, M.D. [facility] 11:44 AM

## 2023-01-29 ENCOUNTER — Encounter: Payer: Self-pay | Admitting: Allergy

## 2023-01-29 ENCOUNTER — Encounter: Payer: Self-pay | Admitting: Physician Assistant

## 2023-01-29 ENCOUNTER — Encounter: Payer: Self-pay | Admitting: Family Medicine

## 2023-01-29 ENCOUNTER — Other Ambulatory Visit (HOSPITAL_COMMUNITY): Payer: Self-pay

## 2023-01-29 ENCOUNTER — Telehealth: Payer: 59 | Admitting: Physician Assistant

## 2023-01-29 ENCOUNTER — Ambulatory Visit: Payer: Self-pay | Admitting: *Deleted

## 2023-01-29 DIAGNOSIS — H109 Unspecified conjunctivitis: Secondary | ICD-10-CM

## 2023-01-29 MED ORDER — OFLOXACIN 0.3 % OP SOLN
1.0000 [drp] | Freq: Four times a day (QID) | OPHTHALMIC | 0 refills | Status: AC
Start: 2023-01-29 — End: 2023-02-06
  Filled 2023-01-29: qty 5, 7d supply, fill #0

## 2023-01-29 NOTE — Progress Notes (Signed)
Virtual Visit Consent   Susan Boyle, you are scheduled for a virtual visit with a [facility] provider today. Just as with appointments in the office, your consent must be obtained to participate. Your consent will be active for this visit and any virtual visit you may have with one of our providers in the next 365 days. If you have a MyChart account, a copy of this consent can be sent to you electronically.  As this is a virtual visit, video technology does not allow for your provider to perform a traditional examination. This may limit your provider's ability to fully assess your condition. If your provider identifies any concerns that need to be evaluated in person or the need to arrange testing (such as labs, EKG, etc.), we will make arrangements to do so. Although advances in technology are sophisticated, we cannot ensure that it will always work on either your end or our end. If the connection with a video visit is poor, the visit may have to be switched to a telephone visit. With either a video or telephone visit, we are not always able to ensure that we have a secure connection.  By engaging in this virtual visit, you consent to the provision of healthcare and authorize for your insurance to be billed (if applicable) for the services provided during this visit. Depending on your insurance coverage, you may receive a charge related to this service.  I need to obtain your verbal consent now. Are you willing to proceed with your visit today? Susan Boyle has provided verbal consent on 01/29/2023 for a virtual visit (video or telephone). Gilberto Better, [place]  Date: 01/29/2023 4:07 PM  Virtual Visit via Video Note   I, Susan Boyle, connected with  CHAMPAIGNE KISSEE  ([id number], [birth year]) on 01/29/23 at  3:45 PM EST by a video-enabled telemedicine application and verified that I am speaking with the correct person using two identifiers.  Location: Patient: Virtual Visit Location Patient:  Home Provider: Virtual Visit Location Provider: Home Office   I discussed the limitations of evaluation and management by telemedicine and the availability of in person appointments. The patient expressed understanding and agreed to proceed.    History of Present Illness: [name] is a 32 y.o. who identifies as a female who was assigned female at birth, and is being seen today for eye irritation.  HPI: 32 y/o F presents to the clinic for c/o both(R>L eye) with burning sensation and tearing. Her allergist has given her Cromolyn eye drops which she used for the first time and her right eye is very irritated and red and clear watery discharge. Denies known exposure to pink eye. No purulent drainage. Denies headache, photophobia or vision changes.   Eye Problem     Problems:  Patient Active Problem List   Diagnosis Date Noted   Other Specified Feeding or Eating Disorder, Emotional Eating Behaviors 01/26/2023   Vitamin D deficiency 12/30/2022   Other fatigue 12/10/2022   SOBOE (shortness of breath on exertion) 12/10/2022   Gastroesophageal reflux disease 12/10/2022   Depression screen 12/10/2022   BMI 31.0-31.9,adult 12/10/2022   Generalized obesity 12/10/2022   Anxiety and depression 11/26/2022   Class 1 obesity due to excess calories with body mass index (BMI) of 31.0 to 31.9 in adult 11/26/2022   Elevated cholesterol with high triglycerides 11/26/2022   Attention deficit hyperactivity disorder (ADHD) 09/17/2022   IUD contraception 09/17/2022   Acute pain of right shoulder 03/22/2021   Seasonal  and perennial allergic rhinoconjunctivitis 11/05/2020   Allergic reaction 07/04/2020   Food allergy 05/02/2020   Environmental allergies 05/02/2020   Dermatographism 05/02/2020   Asthma    Lateral epicondylitis of right elbow 09/26/2015   Subacromial bursitis 08/15/2015    Allergies:  Allergies  Allergen Reactions   Sulfamethoxazole Nausea And Vomiting   Medications:  Current  Outpatient Medications:    ofloxacin (OCUFLOX) 0.3 % ophthalmic solution, Place 1 drop into the right eye 4 (four) times daily for 5 days., Disp: 5 mL, Rfl: 0   albuterol (PROVENTIL) (2.5 MG/3ML) 0.083% nebulizer solution, Take 3 mLs (2.5 mg total) by nebulization every 4 (four) hours as needed for wheezing or shortness of breath (coughing fits)., Disp: 90 mL, Rfl: 1   albuterol (VENTOLIN HFA) 108 (90 Base) MCG/ACT inhaler, Inhale 2 puffs into the lungs every 4 (four) hours as needed for wheezing or shortness of breath (coughing fits)., Disp: 6.7 g, Rfl: 1   brompheniramine-pseudoephedrine-DM 30-2-10 MG/5ML syrup, Take 5 mLs by mouth 4 (four) times daily as needed., Disp: , Rfl:    buPROPion (WELLBUTRIN XL) 150 MG 24 hr tablet, Take 1 tablet (150 mg total) by mouth at bedtime., Disp: 90 tablet, Rfl: 3   cromolyn (OPTICROM) 4 % ophthalmic solution, Place 1 drop into both eyes 4 (four) times daily as needed (itchy/watery eyes)., Disp: 10 mL, Rfl: 1   EPINEPHrine (EPIPEN 2-PAK) 0.3 mg/0.3 mL IJ SOAJ injection, Inject 0.3 mg into the muscle as needed for anaphylaxis., Disp: 2 each, Rfl: 1   famotidine (PEPCID) 40 MG tablet, Take 1 tablet (40 mg total) by mouth at bedtime., Disp: 30 tablet, Rfl: 0   fluticasone (FLONASE) 50 MCG/ACT nasal spray, Place 1-2 sprays into both nostrils daily as needed (nasal congestion)., Disp: 16 g, Rfl: 5   fluticasone-salmeterol (ADVAIR) 250-50 MCG/ACT AEPB, Inhale 1 puff into the lungs in the morning and at bedtime., Disp: 60 each, Rfl: 5   levonorgestrel (MIRENA) 20 MCG/24HR IUD, 1 each by Intrauterine route once., Disp: , Rfl:    lisdexamfetamine (VYVANSE) 60 MG capsule, Take 1 capsule (60 mg total) by mouth every morning., Disp: 90 capsule, Rfl: 0   PARoxetine (PAXIL) 20 MG tablet, Take 1 tablet (20 mg total) by mouth at bedtime., Disp: 90 tablet, Rfl: 3   traZODone (DESYREL) 50 MG tablet, Take 1-3 tablets (50-150 mg total) by mouth at bedtime as needed., Disp: 270 tablet,  Rfl: 3   Vitamin D, Ergocalciferol, (DRISDOL) 1.25 MG (50000 UNIT) CAPS capsule, Take 1 capsule (50,000 Units total) by mouth every 7 (seven) days., Disp: 5 capsule, Rfl: 0  Observations/Objective: Patient is well-developed, well-nourished in no acute distress.  Resting comfortably  at home.  Head is normocephalic, atraumatic.  No labored breathing.  Speech is clear and coherent with logical content.  Patient is alert and oriented at baseline.    Assessment and Plan: 1. Conjunctivitis of right eye, unspecified conjunctivitis type - ofloxacin (OCUFLOX) 0.3 % ophthalmic solution; Place 1 drop into the right eye 4 (four) times daily for 5 days.  Dispense: 5 mL; Refill: 0  Apply cold compresses to the affected eye. Take Allegra or Cetirizine during the day and Benadryl at bedtime. May use saline eye drops to help with burning sensation in the eye. Use otc Zaditor or Pataday/Patanol eye drops instead fo Cromolyn eye drops. Continue to watch for worsening symptoms. If symptoms worsen tomorrow then start eye drops as prescribed.  Pt verbalized understanding and in agreement.  Follow Up Instructions: I discussed the assessment and treatment plan with the patient. The patient was provided an opportunity to ask questions and all were answered. The patient agreed with the plan and demonstrated an understanding of the instructions.  A copy of instructions were sent to the patient via MyChart unless otherwise noted below.   Patient has requested to receive PHI (AVS, Work Notes, etc) pertaining to this video visit through e-mail as they are currently without active MyChart. They have voiced understand that email is not considered secure and their health information could be viewed by someone other than the patient.   The patient was advised to call back or seek an in-person evaluation if the symptoms worsen or if the condition fails to improve as anticipated.    Gilberto Better, PA-C

## 2023-01-29 NOTE — Patient Instructions (Signed)
Susan Boyle, thank you for joining Gilberto Better, PA-C for today's virtual visit.  While this provider is not your primary care provider (PCP), if your PCP is located in our provider database this encounter information will be shared with them immediately following your visit.   A [facility] MyChart account gives you access to today's visit and all your visits, tests, and labs performed at [facility] " click here if you don't have a  MyChart account or go to [email or web address]  Consent: (Patient) Susan Boyle provided verbal consent for this virtual visit at the beginning of the encounter.  Current Medications:  Current Outpatient Medications:    ofloxacin (OCUFLOX) 0.3 % ophthalmic solution, Place 1 drop into the right eye 4 (four) times daily for 5 days., Disp: 5 mL, Rfl: 0   albuterol (PROVENTIL) (2.5 MG/3ML) 0.083% nebulizer solution, Take 3 mLs (2.5 mg total) by nebulization every 4 (four) hours as needed for wheezing or shortness of breath (coughing fits)., Disp: 90 mL, Rfl: 1   albuterol (VENTOLIN HFA) 108 (90 Base) MCG/ACT inhaler, Inhale 2 puffs into the lungs every 4 (four) hours as needed for wheezing or shortness of breath (coughing fits)., Disp: 6.7 g, Rfl: 1   brompheniramine-pseudoephedrine-DM 30-2-10 MG/5ML syrup, Take 5 mLs by mouth 4 (four) times daily as needed., Disp: , Rfl:    buPROPion (WELLBUTRIN XL) 150 MG 24 hr tablet, Take 1 tablet (150 mg total) by mouth at bedtime., Disp: 90 tablet, Rfl: 3   cromolyn (OPTICROM) 4 % ophthalmic solution, Place 1 drop into both eyes 4 (four) times daily as needed (itchy/watery eyes)., Disp: 10 mL, Rfl: 1   EPINEPHrine (EPIPEN 2-PAK) 0.3 mg/0.3 mL IJ SOAJ injection, Inject 0.3 mg into the muscle as needed for anaphylaxis., Disp: 2 each, Rfl: 1   famotidine (PEPCID) 40 MG tablet, Take 1 tablet (40 mg total) by mouth at bedtime., Disp: 30 tablet, Rfl: 0   fluticasone (FLONASE) 50 MCG/ACT nasal spray,  Place 1-2 sprays into both nostrils daily as needed (nasal congestion)., Disp: 16 g, Rfl: 5   fluticasone-salmeterol (ADVAIR) 250-50 MCG/ACT AEPB, Inhale 1 puff into the lungs in the morning and at bedtime., Disp: 60 each, Rfl: 5   levonorgestrel (MIRENA) 20 MCG/24HR IUD, 1 each by Intrauterine route once., Disp: , Rfl:    lisdexamfetamine (VYVANSE) 60 MG capsule, Take 1 capsule (60 mg total) by mouth every morning., Disp: 90 capsule, Rfl: 0   PARoxetine (PAXIL) 20 MG tablet, Take 1 tablet (20 mg total) by mouth at bedtime., Disp: 90 tablet, Rfl: 3   traZODone (DESYREL) 50 MG tablet, Take 1-3 tablets (50-150 mg total) by mouth at bedtime as needed., Disp: 270 tablet, Rfl: 3   Vitamin D, Ergocalciferol, (DRISDOL) 1.25 MG (50000 UNIT) CAPS capsule, Take 1 capsule (50,000 Units total) by mouth every 7 (seven) days., Disp: 5 capsule, Rfl: 0   Medications ordered in this encounter:  Meds ordered this encounter  Medications   ofloxacin (OCUFLOX) 0.3 % ophthalmic solution    Sig: Place 1 drop into the right eye 4 (four) times daily for 5 days.    Dispense:  5 mL    Refill:  0    Order Specific Question:   Supervising Provider    Answer:   Merrilee Jansky [[id number]]     *If you need refills on other medications prior to your next appointment, please contact your pharmacy*  Follow-Up: Call back or seek an in-person evaluation if the symptoms  worsen or if the condition fails to improve as anticipated.  [facility] [phone]  Other Instructions Apply cold compresses to the affected eye. Take Allegra or Cetirizine during the day and Benadryl at bedtime. May use saline eye drops to help with burning sensation in the eye. Use otc Zaditor or Pataday/Patanol eye drops instead fo Cromolyn eye drops. Continue to watch for worsening symptoms. If symptoms worsen tomorrow then start eye drops as prescribed.   If you have been instructed to have an in-person evaluation today at a  local Urgent Care facility, please use the link below. It will take you to a list of all of our available [facility], including address, phone number and hours of operation. Please do not delay care.  [facility]  If you or a family member do not have a primary care provider, use the link below to schedule a visit and establish care. When you choose a [facility] primary care physician or advanced practice provider, you gain a long-term partner in health. Find a Primary Care Provider  Learn more about [facility] in-office and virtual care options: [facility] - Get Care Now

## 2023-01-30 ENCOUNTER — Other Ambulatory Visit (HOSPITAL_COMMUNITY): Payer: Self-pay

## 2023-02-03 ENCOUNTER — Telehealth (INDEPENDENT_AMBULATORY_CARE_PROVIDER_SITE_OTHER): Payer: 59 | Admitting: Psychology

## 2023-02-03 DIAGNOSIS — F909 Attention-deficit hyperactivity disorder, unspecified type: Secondary | ICD-10-CM

## 2023-02-03 DIAGNOSIS — F32A Depression, unspecified: Secondary | ICD-10-CM | POA: Diagnosis not present

## 2023-02-03 DIAGNOSIS — F5089 Other specified eating disorder: Secondary | ICD-10-CM

## 2023-02-03 DIAGNOSIS — F419 Anxiety disorder, unspecified: Secondary | ICD-10-CM

## 2023-02-03 NOTE — Progress Notes (Signed)
  Office: [phone]  /  Fax: [phone]    Date: February 03, 2023  Appointment Start Time: 11:02am Duration: 26 minutes Provider: Lawerance Cruel, Psy.D. Type of Session: Individual Therapy  Location of Patient: Work (private location) Location of Provider: Provider's Home (private office) Type of Contact: Telepsychological Visit via MyChart Video Visit  Session Content: Susan Boyle is a 32 y.o. female presenting for a follow-up appointment to address the previously established treatment goal of increasing coping skills.Today's appointment was a telepsychological visit. [name] provided verbal consent for today's telepsychological appointment and she is aware she is responsible for securing confidentiality on her end of the session. Prior to proceeding with today's appointment, [name]'s physical location at the time of this appointment was obtained as well a phone number she could be reached at in the event of technical difficulties. [name] and this provider participated in today's telepsychological service.   This provider conducted a brief check-in. Robby shared about her eating habits during Thanksgiving, noting, "I ate anything I wanted to." Psychoeducation regarding making better choices and engaging in portion control during the holidays/celebrations/vacations was provided. More specifically, this provider discussed the following strategies: coming to meals hungry, but not starving; avoid filling up on appetizers; managing portion sizes; not completely depriving yourself; making the plate colorful (e.g., vegetables); pacing yourself (e.g., waiting 10 minutes before going back for seconds); taking advantage of the nutritious foods; practicing mindfulness; staying hydrated; and avoid bringing home leftovers. Overall, [name] was receptive to today's appointment as evidenced by openness to sharing, responsiveness to feedback, and willingness to implement discussed strategies .  Mental Status  Examination:  Appearance: neat Behavior: appropriate to circumstances Mood: neutral Affect: mood congruent Speech: WNL Eye Contact: appropriate Psychomotor Activity: WNL Gait: unable to assess Thought Process: linear, logical, and goal directed and no evidence or endorsement of suicidal, homicidal, and self-harm ideation, plan and intent  Thought Content/Perception: no hallucinations, delusions, bizarre thinking or behavior endorsed or observed Orientation: AAOx4 Memory/Concentration: memory, attention, language, and fund of knowledge intact  Insight: fair Judgment: fair  Interventions:  Conducted a brief chart review Provided empathic reflections and validation Provided positive reinforcement Employed supportive psychotherapy interventions to facilitate reduced distress and to improve coping skills with identified stressors Psychoeducation provided regarding strategies for celebrations/holidays/vacations  DSM-5 Diagnosis(es):  F50.89 Other Specified Feeding or Eating Disorder, Emotional Eating Behaviors, F90.9 Unspecified Attention-Deficit/Hyperactivity Disorder , F41.9 Unspecified Anxiety Disorder, and  F32.A Unspecified Depressive Disorder  Treatment Goal & Progress: During the initial appointment with this provider, the following treatment goal was established: increase coping skills. Progress is limited, as Baby has just begun treatment with this provider; however, she is receptive to the interaction and interventions and rapport is being established.   Plan: The next appointment is scheduled for 02/23/2023 at 11am, which will be via MyChart Video Visit. The next session will focus on working towards the established treatment goal. [name] will call [facility] to establish care.

## 2023-02-04 ENCOUNTER — Ambulatory Visit (INDEPENDENT_AMBULATORY_CARE_PROVIDER_SITE_OTHER): Payer: 59 | Admitting: Family Medicine

## 2023-02-04 ENCOUNTER — Encounter: Payer: Self-pay | Admitting: Family Medicine

## 2023-02-04 VITALS — BP 119/74 | HR 91 | Temp 98.0°F | Resp 18 | Ht 63.0 in | Wt 186.1 lb

## 2023-02-04 DIAGNOSIS — R946 Abnormal results of thyroid function studies: Secondary | ICD-10-CM | POA: Diagnosis not present

## 2023-02-04 DIAGNOSIS — R61 Generalized hyperhidrosis: Secondary | ICD-10-CM | POA: Diagnosis not present

## 2023-02-04 NOTE — Progress Notes (Signed)
   Acute Office Visit  Subjective:     Patient ID: Susan Boyle, female    DOB: 1990-11-14, 32 y.o.   MRN: [id number]  Chief Complaint  Patient presents with   Excessive Sweating    Patient states that for the last 3 months she has been experiencing excessive sweating    Paper work    Patient is here needing paper work filled out for her place of employment    HPI Patient is in today for acute visit.  Pt was sick from 11/20-11/28. She has FMLA paperwork that she needs signed. Most of the form is completed and reviewed today. Verified today with office visit dates.  Pt reports she has excessive sweating since her diet changes. She was noted to have her thyroid checked back in October which was abnormal. TSH 8.1.  Review of Systems  Endo/Heme/Allergies:        Excessive sweating  All other systems reviewed and are negative.      Objective:    BP 119/74   Pulse 91   Temp 98 F (36.7 C) (Oral)   Resp 18   Ht 5\' 3"  (1.6 m)   Wt 186 lb 1.6 oz (84.4 kg)   SpO2 97%   BMI 32.97 kg/m  BP Readings from Last 3 Encounters:  02/04/23 119/74  01/26/23 124/84  01/21/23 110/76      Physical Exam Vitals and nursing note reviewed.  Constitutional:      Appearance: Normal appearance. She is normal weight.  HENT:     Head: Normocephalic and atraumatic.     Right Ear: External ear normal.     Left Ear: External ear normal.     Nose: Nose normal.     Mouth/Throat:     Mouth: Mucous membranes are moist.     Pharynx: Oropharynx is clear.  Eyes:     Conjunctiva/sclera: Conjunctivae normal.     Pupils: Pupils are equal, round, and reactive to light.  Cardiovascular:     Rate and Rhythm: Normal rate.  Pulmonary:     Effort: Pulmonary effort is normal.  Skin:    Capillary Refill: Capillary refill takes less than 2 seconds.  Neurological:     General: No focal deficit present.     Mental Status: She is alert and oriented to person, place, and time. Mental status is at  baseline.  Psychiatric:        Mood and Affect: Mood normal.        Behavior: Behavior normal.        Thought Content: Thought content normal.        Judgment: Judgment normal.   No results found for any visits on 02/04/23.      Assessment & Plan:   Problem List Items Addressed This Visit   None Excessive sweating -     TSH -     T4, free  Abnormal thyroid function test -     TSH -     T4, free   Due to abnormal thyroid studies in October and excessive sweating, will repeat today. If still abnormal, warrants treatment. FMLA paperwork signed today. Already completed and verified.   No orders of the defined types were placed in this encounter.   No follow-ups on file.  Suzan Slick, MD

## 2023-02-05 ENCOUNTER — Encounter: Payer: Self-pay | Admitting: Family Medicine

## 2023-02-05 DIAGNOSIS — R61 Generalized hyperhidrosis: Secondary | ICD-10-CM

## 2023-02-05 LAB — T4, FREE: Free T4: 1.06 ng/dL (ref 0.82–1.77)

## 2023-02-05 LAB — TSH: TSH: 2.32 u[IU]/mL (ref 0.450–4.500)

## 2023-02-09 MED ORDER — ALUMINUM CHLORIDE 20 % EX SOLN
Freq: Every evening | CUTANEOUS | 0 refills | Status: DC | PRN
  Filled 2023-02-12: qty 35, 30d supply, fill #0

## 2023-02-12 ENCOUNTER — Other Ambulatory Visit (HOSPITAL_COMMUNITY): Payer: Self-pay

## 2023-02-13 ENCOUNTER — Other Ambulatory Visit (HOSPITAL_COMMUNITY): Payer: Self-pay

## 2023-02-17 ENCOUNTER — Encounter (HOSPITAL_COMMUNITY): Payer: Self-pay

## 2023-02-17 ENCOUNTER — Other Ambulatory Visit: Payer: Self-pay

## 2023-02-17 ENCOUNTER — Other Ambulatory Visit: Payer: Self-pay | Admitting: Family Medicine

## 2023-02-17 ENCOUNTER — Other Ambulatory Visit (HOSPITAL_COMMUNITY): Payer: Self-pay

## 2023-02-17 DIAGNOSIS — K219 Gastro-esophageal reflux disease without esophagitis: Secondary | ICD-10-CM

## 2023-02-17 MED ORDER — FAMOTIDINE 40 MG PO TABS
40.0000 mg | ORAL_TABLET | Freq: Every day | ORAL | 0 refills | Status: DC
  Filled 2023-02-17: qty 30, 30d supply, fill #0

## 2023-02-18 DIAGNOSIS — R609 Edema, unspecified: Secondary | ICD-10-CM | POA: Diagnosis not present

## 2023-02-18 DIAGNOSIS — S42302A Unspecified fracture of shaft of humerus, left arm, initial encounter for closed fracture: Secondary | ICD-10-CM | POA: Diagnosis not present

## 2023-02-18 DIAGNOSIS — Z720 Tobacco use: Secondary | ICD-10-CM | POA: Insufficient documentation

## 2023-02-18 DIAGNOSIS — I10 Essential (primary) hypertension: Secondary | ICD-10-CM | POA: Diagnosis not present

## 2023-02-18 DIAGNOSIS — W19XXXA Unspecified fall, initial encounter: Secondary | ICD-10-CM | POA: Diagnosis not present

## 2023-02-18 DIAGNOSIS — R61 Generalized hyperhidrosis: Secondary | ICD-10-CM | POA: Diagnosis not present

## 2023-02-18 DIAGNOSIS — S42352A Displaced comminuted fracture of shaft of humerus, left arm, initial encounter for closed fracture: Secondary | ICD-10-CM | POA: Insufficient documentation

## 2023-02-18 DIAGNOSIS — J45909 Unspecified asthma, uncomplicated: Secondary | ICD-10-CM | POA: Diagnosis not present

## 2023-02-19 DIAGNOSIS — S42412A Displaced simple supracondylar fracture without intercondylar fracture of left humerus, initial encounter for closed fracture: Secondary | ICD-10-CM | POA: Insufficient documentation

## 2023-02-19 DIAGNOSIS — S42352A Displaced comminuted fracture of shaft of humerus, left arm, initial encounter for closed fracture: Secondary | ICD-10-CM | POA: Diagnosis not present

## 2023-02-20 ENCOUNTER — Encounter: Payer: Self-pay | Admitting: Family Medicine

## 2023-02-20 DIAGNOSIS — S42352A Displaced comminuted fracture of shaft of humerus, left arm, initial encounter for closed fracture: Secondary | ICD-10-CM | POA: Diagnosis not present

## 2023-02-20 DIAGNOSIS — F17290 Nicotine dependence, other tobacco product, uncomplicated: Secondary | ICD-10-CM | POA: Diagnosis not present

## 2023-02-20 DIAGNOSIS — S42492A Other displaced fracture of lower end of left humerus, initial encounter for closed fracture: Secondary | ICD-10-CM | POA: Diagnosis not present

## 2023-02-20 DIAGNOSIS — Z79899 Other long term (current) drug therapy: Secondary | ICD-10-CM | POA: Diagnosis not present

## 2023-02-20 DIAGNOSIS — J45909 Unspecified asthma, uncomplicated: Secondary | ICD-10-CM | POA: Diagnosis not present

## 2023-02-20 DIAGNOSIS — R339 Retention of urine, unspecified: Secondary | ICD-10-CM | POA: Diagnosis not present

## 2023-02-23 ENCOUNTER — Telehealth (INDEPENDENT_AMBULATORY_CARE_PROVIDER_SITE_OTHER): Payer: 59 | Admitting: Psychology

## 2023-02-24 ENCOUNTER — Encounter: Payer: Self-pay | Admitting: Family Medicine

## 2023-02-24 ENCOUNTER — Ambulatory Visit: Payer: 59 | Admitting: Family Medicine

## 2023-02-24 ENCOUNTER — Encounter (INDEPENDENT_AMBULATORY_CARE_PROVIDER_SITE_OTHER): Payer: Self-pay

## 2023-02-24 VITALS — BP 119/68 | HR 73 | Temp 98.1°F | Resp 18 | Ht 63.0 in | Wt 193.8 lb

## 2023-02-24 DIAGNOSIS — Z09 Encounter for follow-up examination after completed treatment for conditions other than malignant neoplasm: Secondary | ICD-10-CM

## 2023-02-24 DIAGNOSIS — S42352D Displaced comminuted fracture of shaft of humerus, left arm, subsequent encounter for fracture with routine healing: Secondary | ICD-10-CM

## 2023-02-24 DIAGNOSIS — L299 Pruritus, unspecified: Secondary | ICD-10-CM | POA: Diagnosis not present

## 2023-02-24 MED ORDER — HYDROXYZINE HCL 10 MG PO TABS: 10.0000 mg | ORAL_TABLET | Freq: Three times a day (TID) | ORAL | 0 refills | Status: DC | PRN

## 2023-02-24 NOTE — Patient Instructions (Signed)
 Soft dressing, Please remove the dressings at home on POD 5 and then apply clean new dry dressing daily. The patient may follow the below shower instructions after new dressings have been applied:  1. Gently wash with warm soapy water. Do not Scrub. Allow water to gently run over incision to rinse. Pat dry. Apply clean new dry bandage daily. 2. Do not soak or submerge incision in hot tub, bath tub, pool, river, lake, ocean, or any other body of water. 3. Do not apply any creams, lotions, gels, ointments, powders, Vaseline, ...etc to incision until staples removed at your follow up appointment.

## 2023-02-24 NOTE — Progress Notes (Signed)
 Established Patient Office Visit  Subjective   Patient ID: Susan Boyle, female    DOB: May 03, 1990  Age: 32 y.o. MRN: [id number]  Chief Complaint  Patient presents with   Hospitalization Follow-up    Fracture left arm 12/25-12/27th [facility]    HPI  Hospital follow up On Christmas day, she was playing with the dog with socks on. She slipped, fell, and hit the corner of the wall; injurying her left arm. She suffered a comminuted displaced fracture of left humerus. She went to OR on 12/26 for Open reduction and internal reduction. She was given instructions on wound care but she lost this. She was discharged on 12/27. She asks about dressing changes etc. She reports the wound is itchy. She was given Benadryl that just makes her sleepy. She has OT on 1/2 as her first session.  She was given a sling to wear but she reports it doesn't fit. She follows up with the surgeon on 1/16.  Review of Systems  Musculoskeletal:  Positive for joint pain.  All other systems reviewed and are negative.     Objective:     BP 119/68   Pulse 73   Temp 98.1 F (36.7 C) (Oral)   Resp 18   Ht 5' 3 (1.6 m)   Wt 193 lb 12.8 oz (87.9 kg)   SpO2 99%   BMI 34.33 kg/m  BP Readings from Last 3 Encounters:  02/24/23 119/68  02/04/23 119/74  01/26/23 124/84      Physical Exam Vitals and nursing note reviewed.  Constitutional:      Appearance: Normal appearance. She is normal weight.  HENT:     Head: Normocephalic and atraumatic.     Right Ear: External ear normal.     Left Ear: External ear normal.     Nose: Nose normal.     Mouth/Throat:     Mouth: Mucous membranes are moist.     Pharynx: Oropharynx is clear.  Eyes:     Conjunctiva/sclera: Conjunctivae normal.     Pupils: Pupils are equal, round, and reactive to light.  Cardiovascular:     Rate and Rhythm: Normal rate.     Pulses: Normal pulses.  Pulmonary:     Effort: Pulmonary effort is normal.  Skin:    General: Skin is warm.      Capillary Refill: Capillary refill takes less than 2 seconds.     Comments: Bandage intact no signs of drainage  Neurological:     General: No focal deficit present.     Mental Status: She is alert and oriented to person, place, and time. Mental status is at baseline.  Psychiatric:        Mood and Affect: Mood normal.        Behavior: Behavior normal.        Thought Content: Thought content normal.        Judgment: Judgment normal.     No results found for any visits on 02/24/23.     The ASCVD Risk score (Arnett DK, et al., 2019) failed to calculate for the following reasons:   The 2019 ASCVD risk score is only valid for ages 37 to 10    Assessment & Plan:   Problem List Items Addressed This Visit       Musculoskeletal and Integument   Closed displaced comminuted fracture of shaft of left humerus   Other Visit Diagnoses       Hospital discharge follow-up    -  Primary     Itchy skin       Relevant Medications   hydrOXYzine  (ATARAX ) 10 MG tablet      Hospital discharge follow-up  Closed displaced comminuted fracture of shaft of left humerus with routine healing, subsequent encounter  Itchy skin -     hydrOXYzine  HCl; Take 1 tablet (10 mg total) by mouth 3 (three) times daily as needed for itching.  Dispense: 30 tablet; Refill: 0   Discussed home care instructions with pt. Re-printed directions for her given at the time of discharge. To see OT on Thursday and follow up with surgeon as advised. To get new sling at OT session. No follow-ups on file.    Torrence CINDERELLA Barrier, MD

## 2023-02-26 DIAGNOSIS — M25622 Stiffness of left elbow, not elsewhere classified: Secondary | ICD-10-CM | POA: Insufficient documentation

## 2023-02-26 DIAGNOSIS — S42302A Unspecified fracture of shaft of humerus, left arm, initial encounter for closed fracture: Secondary | ICD-10-CM | POA: Diagnosis not present

## 2023-02-26 DIAGNOSIS — M25612 Stiffness of left shoulder, not elsewhere classified: Secondary | ICD-10-CM | POA: Insufficient documentation

## 2023-02-26 NOTE — Telephone Encounter (Signed)
Please contact pt.

## 2023-03-04 ENCOUNTER — Encounter: Payer: Self-pay | Admitting: Family Medicine

## 2023-03-04 ENCOUNTER — Ambulatory Visit: Payer: 59 | Admitting: Family Medicine

## 2023-03-04 ENCOUNTER — Other Ambulatory Visit (HOSPITAL_BASED_OUTPATIENT_CLINIC_OR_DEPARTMENT_OTHER): Payer: Self-pay

## 2023-03-04 DIAGNOSIS — L304 Erythema intertrigo: Secondary | ICD-10-CM

## 2023-03-04 MED ORDER — GABAPENTIN 100 MG PO CAPS
100.0000 mg | ORAL_CAPSULE | Freq: Three times a day (TID) | ORAL | 0 refills | Status: DC
  Filled 2023-03-04: qty 42, 14d supply, fill #0

## 2023-03-04 MED ORDER — NYSTATIN-TRIAMCINOLONE 100000-0.1 UNIT/GM-% EX OINT: 1.0000 | TOPICAL_OINTMENT | Freq: Two times a day (BID) | CUTANEOUS | 0 refills | Status: DC

## 2023-03-05 ENCOUNTER — Other Ambulatory Visit: Payer: Self-pay | Admitting: Family Medicine

## 2023-03-05 DIAGNOSIS — E559 Vitamin D deficiency, unspecified: Secondary | ICD-10-CM

## 2023-03-05 MED ORDER — VITAMIN D (ERGOCALCIFEROL) 1.25 MG (50000 UNIT) PO CAPS: 50000.0000 [IU] | ORAL_CAPSULE | ORAL | 0 refills | Status: DC

## 2023-03-13 ENCOUNTER — Telehealth: Payer: Self-pay

## 2023-03-13 NOTE — Telephone Encounter (Signed)
Called patient - DOB/NEED DPR - advised to check with her insurance for in net work Allergiesy - send [employer] with information - then our office will begin the process for referral to new Allergist /transferring of the vials.  Patient verbalized understanding to all, no further questions.
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
## 2023-03-24 DIAGNOSIS — F3342 Major depressive disorder, recurrent, in full remission: Secondary | ICD-10-CM | POA: Diagnosis not present

## 2023-03-24 DIAGNOSIS — F4310 Post-traumatic stress disorder, unspecified: Secondary | ICD-10-CM | POA: Diagnosis not present

## 2023-03-24 DIAGNOSIS — F900 Attention-deficit hyperactivity disorder, predominantly inattentive type: Secondary | ICD-10-CM | POA: Diagnosis not present

## 2023-03-25 ENCOUNTER — Ambulatory Visit (INDEPENDENT_AMBULATORY_CARE_PROVIDER_SITE_OTHER): Payer: Commercial Managed Care - PPO | Admitting: Family Medicine

## 2023-03-25 ENCOUNTER — Encounter: Payer: Self-pay | Admitting: Family Medicine

## 2023-03-25 VITALS — BP 108/72 | HR 84 | Temp 98.1°F | Ht 63.0 in | Wt 188.0 lb

## 2023-03-25 DIAGNOSIS — Z6833 Body mass index (BMI) 33.0-33.9, adult: Secondary | ICD-10-CM | POA: Diagnosis not present

## 2023-03-25 DIAGNOSIS — E559 Vitamin D deficiency, unspecified: Secondary | ICD-10-CM

## 2023-03-25 DIAGNOSIS — E66811 Obesity, class 1: Secondary | ICD-10-CM

## 2023-03-25 DIAGNOSIS — R12 Heartburn: Secondary | ICD-10-CM

## 2023-03-25 DIAGNOSIS — E88819 Insulin resistance, unspecified: Secondary | ICD-10-CM | POA: Diagnosis not present

## 2023-03-25 DIAGNOSIS — E6609 Other obesity due to excess calories: Secondary | ICD-10-CM

## 2023-03-25 DIAGNOSIS — Z789 Other specified health status: Secondary | ICD-10-CM | POA: Insufficient documentation

## 2023-03-25 MED ORDER — METFORMIN HCL 500 MG PO TABS: 500.0000 mg | ORAL_TABLET | Freq: Every day | ORAL | 0 refills | Status: DC

## 2023-03-25 MED ORDER — FAMOTIDINE 40 MG PO TABS: 40.0000 mg | ORAL_TABLET | Freq: Every day | ORAL | 0 refills | Status: DC

## 2023-03-25 NOTE — Addendum Note (Signed)
Addended by: Glennis Brink on: 03/25/2023 04:41 PM   Modules accepted: Orders

## 2023-03-25 NOTE — Assessment & Plan Note (Signed)
Last vitamin D Lab Results  Component Value Date   VD25OH 13.4 (L) 12/10/2022   She is taking RX vitamin D weekly Denies adverse SE  Repeat lab in 1-2 mos

## 2023-03-25 NOTE — Assessment & Plan Note (Signed)
She is in OT and out of work following a comminuted L humerus fracture that occurred on Christmas She has limited ability to cook anything that requires 2 hands She has had to limit exercise options These changes along with neuropathic pain and adjusting to changes has worsened appetite and cravings  Continue with OT at [facility] Work on patience with progress Work on reducing stress levels

## 2023-03-25 NOTE — Progress Notes (Signed)
Office: [phone]  /  Fax: [phone]  WEIGHT SUMMARY AND BIOMETRICS  Starting Date: 12/10/22  Starting Weight: 180lb   Weight Lost Since Last Visit: 0lb   Vitals Temp: 98.1 F (36.7 C) BP: 108/72 Pulse Rate: 84 SpO2: 99 %   Body Composition  Body Fat %: 43.4 % Fat Mass (lbs): 82 lbs Muscle Mass (lbs): 101.4 lbs Total Body Water (lbs): 74.8 lbs Visceral Fat Rating : 9     HPI  Chief Complaint: OBESITY  Susan Boyle is here to discuss her progress with her obesity treatment plan. She is on the the Category 2 Plan and states she is following her eating plan approximately 85 % of the time. She states she is walking for 45-60 minutes 4 times per week.   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 and radicular pain She is in OT with [facility] She has been out of work since her injury [facility] the [facility]- walking on the treadmill She has limited her sugar intake She has felt hungrier for the past 2 mos, feeling like she does not get full and is hungry between meals   She is making healthier food choices She has not been taking her Vyvanse and plans to change back to Adderall with her psychiatrist  She is taking the following weight gaining meds: Paxil, gabapentin  Pharmacotherapy: none  PHYSICAL EXAM:  Blood pressure 108/72, pulse 84, temperature 98.1 F (36.7 C), height 5\' 3"  (1.6 m), weight 188 lb (85.3 kg), SpO2 99%. Body mass index is 33.3 kg/m.  General: She is overweight, cooperative, alert, well developed, and in no acute distress. PSYCH: Has normal mood, affect and thought process.   Lungs: Normal breathing effort, no conversational dyspnea.   ASSESSMENT AND PLAN  TREATMENT PLAN FOR OBESITY:  Recommended Dietary Goals  Susan Boyle is currently in the action stage of change. As such, her goal is to continue  weight management plan. She has agreed to the Category 2 Plan. Handout provided on low calorie density vs high calorie density foods  Behavioral Intervention  We discussed the following Behavioral Modification Strategies today: increasing lean protein intake to established goals, increasing vegetables, increasing lower glycemic fruits, increasing fiber rich foods, increasing water intake , work on meal planning and preparation, reading food labels , keeping healthy foods at home, identifying sources and decreasing liquid calories, continue to practice mindfulness when eating, planning for success, and continue to work on maintaining a reduced calorie state, getting the recommended amount of protein, incorporating whole foods, making healthy choices, staying well hydrated and practicing mindfulness when eating..  Additional resources provided today: NA  Recommended Physical Activity Goals  Susan Boyle has been advised to work up to 150 minutes of moderate intensity aerobic activity a week and strengthening exercises 2-3 times per week for cardiovascular health, weight loss maintenance and preservation of muscle mass.   She has agreed to Start aerobic activity with a goal of 150 minutes a week at moderate intensity.   Pharmacotherapy changes for the treatment of obesity: begin metformin XR 500 mg with dinner daily  ASSOCIATED CONDITIONS ADDRESSED TODAY  Insulin resistance Assessment & Plan: She has never used metformin but has felt an increase in hunger over the past 2 mos craving more carbs and sweets She has resumed walking at the gym (  limited some but L arm weakness, in a sling)  Start metformin XR 500 mg once daily w/ dinner Update labs in 6-8 weeks Reduce intake of starches and sweets Exercise as tolerated  Orders: -     metFORMIN HCl; Take 1 tablet (500 mg total) by mouth daily with breakfast.  Dispense: 30 tablet; Refill: 0  Class 1 obesity due to excess calories with body mass index  (BMI) of 33.0 to 33.9 in adult, unspecified whether serious comorbidity present  Vitamin D deficiency Assessment & Plan: Last vitamin D Lab Results  Component Value Date   VD25OH 13.4 (L) 12/10/2022   She is taking RX vitamin D weekly Denies adverse SE  Repeat lab in 1-2 mos   Decreased activities of daily living (ADL) Assessment & Plan: She is in OT and out of work following a comminuted L humerus fracture that occurred on Christmas She has limited ability to cook anything that requires 2 hands She has had to limit exercise options These changes along with neuropathic pain and adjusting to changes has worsened appetite and cravings  Continue with OT at [facility] Work on patience with progress Work on reducing stress levels       She was informed of the importance of frequent follow up visits to maximize her success with intensive lifestyle modifications for her multiple health conditions.   ATTESTASTION STATEMENTS:  Reviewed by clinician on day of visit: allergies, medications, problem list, medical history, surgical history, family history, social history, and previous encounter notes pertinent to obesity diagnosis.   I have personally spent 30 minutes total time today in preparation, patient care, nutritional counseling and documentation for this visit, including the following: review of clinical lab tests; review of medical tests/procedures/services.      Glennis Brink, DO DABFM, DABOM [facility] [address] [place], [place] [address] [phone]

## 2023-03-25 NOTE — Assessment & Plan Note (Signed)
She has never used metformin but has felt an increase in hunger over the past 2 mos craving more carbs and sweets She has resumed walking at the gym (limited some but L arm weakness, in a sling)  Start metformin XR 500 mg once daily w/ dinner Update labs in 6-8 weeks Reduce intake of starches and sweets Exercise as tolerated

## 2023-03-26 ENCOUNTER — Ambulatory Visit: Payer: 59 | Admitting: Family Medicine

## 2023-03-31 DIAGNOSIS — S42352A Displaced comminuted fracture of shaft of humerus, left arm, initial encounter for closed fracture: Secondary | ICD-10-CM | POA: Diagnosis not present

## 2023-03-31 DIAGNOSIS — Z789 Other specified health status: Secondary | ICD-10-CM | POA: Diagnosis not present

## 2023-03-31 DIAGNOSIS — Z9889 Other specified postprocedural states: Secondary | ICD-10-CM | POA: Diagnosis not present

## 2023-03-31 DIAGNOSIS — S42352S Displaced comminuted fracture of shaft of humerus, left arm, sequela: Secondary | ICD-10-CM | POA: Diagnosis not present

## 2023-03-31 DIAGNOSIS — M25612 Stiffness of left shoulder, not elsewhere classified: Secondary | ICD-10-CM | POA: Diagnosis not present

## 2023-03-31 DIAGNOSIS — M25622 Stiffness of left elbow, not elsewhere classified: Secondary | ICD-10-CM | POA: Diagnosis not present

## 2023-04-01 ENCOUNTER — Other Ambulatory Visit: Payer: Self-pay | Admitting: Family Medicine

## 2023-04-01 DIAGNOSIS — E559 Vitamin D deficiency, unspecified: Secondary | ICD-10-CM

## 2023-04-01 MED ORDER — VITAMIN D (ERGOCALCIFEROL) 1.25 MG (50000 UNIT) PO CAPS: 50000.0000 [IU] | ORAL_CAPSULE | ORAL | 0 refills | Status: DC

## 2023-04-02 DIAGNOSIS — G5632 Lesion of radial nerve, left upper limb: Secondary | ICD-10-CM | POA: Insufficient documentation

## 2023-04-02 DIAGNOSIS — S42352D Displaced comminuted fracture of shaft of humerus, left arm, subsequent encounter for fracture with routine healing: Secondary | ICD-10-CM | POA: Diagnosis not present

## 2023-04-03 DIAGNOSIS — S42352S Displaced comminuted fracture of shaft of humerus, left arm, sequela: Secondary | ICD-10-CM | POA: Diagnosis not present

## 2023-04-03 DIAGNOSIS — M25622 Stiffness of left elbow, not elsewhere classified: Secondary | ICD-10-CM | POA: Diagnosis not present

## 2023-04-03 DIAGNOSIS — S42352A Displaced comminuted fracture of shaft of humerus, left arm, initial encounter for closed fracture: Secondary | ICD-10-CM | POA: Diagnosis not present

## 2023-04-03 DIAGNOSIS — Z9889 Other specified postprocedural states: Secondary | ICD-10-CM | POA: Diagnosis not present

## 2023-04-03 DIAGNOSIS — Z789 Other specified health status: Secondary | ICD-10-CM | POA: Diagnosis not present

## 2023-04-03 DIAGNOSIS — M25612 Stiffness of left shoulder, not elsewhere classified: Secondary | ICD-10-CM | POA: Diagnosis not present

## 2023-04-07 DIAGNOSIS — S42352A Displaced comminuted fracture of shaft of humerus, left arm, initial encounter for closed fracture: Secondary | ICD-10-CM | POA: Diagnosis not present

## 2023-04-07 DIAGNOSIS — Z789 Other specified health status: Secondary | ICD-10-CM | POA: Diagnosis not present

## 2023-04-07 DIAGNOSIS — Z9889 Other specified postprocedural states: Secondary | ICD-10-CM | POA: Diagnosis not present

## 2023-04-07 DIAGNOSIS — M25622 Stiffness of left elbow, not elsewhere classified: Secondary | ICD-10-CM | POA: Diagnosis not present

## 2023-04-07 DIAGNOSIS — S42352S Displaced comminuted fracture of shaft of humerus, left arm, sequela: Secondary | ICD-10-CM | POA: Diagnosis not present

## 2023-04-07 DIAGNOSIS — M25612 Stiffness of left shoulder, not elsewhere classified: Secondary | ICD-10-CM | POA: Diagnosis not present

## 2023-04-10 DIAGNOSIS — M25612 Stiffness of left shoulder, not elsewhere classified: Secondary | ICD-10-CM | POA: Diagnosis not present

## 2023-04-10 DIAGNOSIS — S42352S Displaced comminuted fracture of shaft of humerus, left arm, sequela: Secondary | ICD-10-CM | POA: Diagnosis not present

## 2023-04-10 DIAGNOSIS — Z9889 Other specified postprocedural states: Secondary | ICD-10-CM | POA: Diagnosis not present

## 2023-04-10 DIAGNOSIS — S42352A Displaced comminuted fracture of shaft of humerus, left arm, initial encounter for closed fracture: Secondary | ICD-10-CM | POA: Diagnosis not present

## 2023-04-10 DIAGNOSIS — Z789 Other specified health status: Secondary | ICD-10-CM | POA: Diagnosis not present

## 2023-04-10 DIAGNOSIS — M25622 Stiffness of left elbow, not elsewhere classified: Secondary | ICD-10-CM | POA: Diagnosis not present

## 2023-04-21 DIAGNOSIS — M25622 Stiffness of left elbow, not elsewhere classified: Secondary | ICD-10-CM | POA: Diagnosis not present

## 2023-04-21 DIAGNOSIS — M25612 Stiffness of left shoulder, not elsewhere classified: Secondary | ICD-10-CM | POA: Diagnosis not present

## 2023-04-21 DIAGNOSIS — S42352S Displaced comminuted fracture of shaft of humerus, left arm, sequela: Secondary | ICD-10-CM | POA: Diagnosis not present

## 2023-04-21 DIAGNOSIS — Z9889 Other specified postprocedural states: Secondary | ICD-10-CM | POA: Diagnosis not present

## 2023-04-21 DIAGNOSIS — S42352A Displaced comminuted fracture of shaft of humerus, left arm, initial encounter for closed fracture: Secondary | ICD-10-CM | POA: Diagnosis not present

## 2023-04-21 DIAGNOSIS — Z789 Other specified health status: Secondary | ICD-10-CM | POA: Diagnosis not present

## 2023-04-22 ENCOUNTER — Encounter: Payer: Self-pay | Admitting: Family Medicine

## 2023-04-22 ENCOUNTER — Ambulatory Visit: Payer: Commercial Managed Care - PPO | Admitting: Family Medicine

## 2023-04-22 VITALS — BP 122/78 | HR 71 | Temp 98.0°F | Ht 63.0 in | Wt 185.0 lb

## 2023-04-22 DIAGNOSIS — Z6832 Body mass index (BMI) 32.0-32.9, adult: Secondary | ICD-10-CM | POA: Diagnosis not present

## 2023-04-22 DIAGNOSIS — E66811 Obesity, class 1: Secondary | ICD-10-CM | POA: Diagnosis not present

## 2023-04-22 DIAGNOSIS — E6609 Other obesity due to excess calories: Secondary | ICD-10-CM

## 2023-04-22 DIAGNOSIS — E559 Vitamin D deficiency, unspecified: Secondary | ICD-10-CM

## 2023-04-22 DIAGNOSIS — E88819 Insulin resistance, unspecified: Secondary | ICD-10-CM | POA: Diagnosis not present

## 2023-04-22 DIAGNOSIS — R632 Polyphagia: Secondary | ICD-10-CM

## 2023-04-22 MED ORDER — VITAMIN D (ERGOCALCIFEROL) 1.25 MG (50000 UNIT) PO CAPS: 50000.0000 [IU] | ORAL_CAPSULE | ORAL | 0 refills | Status: DC

## 2023-04-22 NOTE — Progress Notes (Signed)
 Office: [phone]  /  Fax: [phone]  WEIGHT SUMMARY AND BIOMETRICS  Starting Date: 12/10/22  Starting Weight: 180lb   Weight Lost Since Last Visit: 3lb   Vitals Temp: 98 F (36.7 C) BP: 122/78 Pulse Rate: 71 SpO2: 99 %   Body Composition  Body Fat %: 43.1 % Fat Mass (lbs): 80 lbs Muscle Mass (lbs): 100 lbs Total Body Water (lbs): 73.2 lbs Visceral Fat Rating : 8     HPI  Chief Complaint: OBESITY  Susan Boyle is here to discuss her progress with her obesity treatment plan. She is on the the Category 2 Plan and states she is following her eating plan approximately 85 % of the time. She states she is exercising 90 minutes 4 times per week.   Interval History:  Since last office visit she is down 3 lb This gives her a net weight gain of 5 lb in 4 mos She has splurged on donuts and junk food a few times since last visit She had no negative effects of eating 3 donuts - 'feeling a high' She has little appetite from Adderall but is working on avoiding meal skipping From did start Semaglutide outside of our practice 0.2 mg weekly x 3 weeks She had a little diarrhea She still has limited use of her L arm from L humerus fracture with nerve damage, in OT She goes back to work in March  Pharmacotherapy: semaglutide compounded agent (thru [facility])  PHYSICAL EXAM:  Blood pressure 122/78, pulse 71, temperature 98 F (36.7 C), height 5\' 3"  (1.6 m), weight 185 lb (83.9 kg), SpO2 99%. Body mass index is 32.77 kg/m.  General: She is overweight, cooperative, alert, well developed, and in no acute distress. PSYCH: Has normal mood, affect and thought process.   Lungs: Normal breathing effort, no conversational dyspnea.   ASSESSMENT AND PLAN  TREATMENT PLAN FOR OBESITY:  Recommended Dietary Goals  Susan Boyle is currently in the action stage of change. As such, her goal is to continue weight management plan. She has agreed to the Category 2 Plan.  Behavioral  Intervention  We discussed the following Behavioral Modification Strategies today: increasing lean protein intake to established goals, increasing fiber rich foods, avoiding skipping meals, increasing water intake , work on meal planning and preparation, keeping healthy foods at home, work on managing stress, creating time for self-care and relaxation, avoiding temptations and identifying enticing environmental cues, planning for success, and continue to work on maintaining a reduced calorie state, getting the recommended amount of protein, incorporating whole foods, making healthy choices, staying well hydrated and practicing mindfulness when eating.. Reviewed food changes to increase protein and fiber with meals: Susan Boyle's [employer] (more protein and fiber) Use Cottage cheese in place of cream cheese Dip carrots in a greek yogurt based dip to increase protein with lunch Try out dried edamame or chickpeas for a snack or add to a salad Try out protein water (doesn't like milk based shakes)  Additional resources provided today: NA  Recommended Physical Activity Goals  Susan Boyle has been advised to work up to 150 minutes of moderate intensity aerobic activity a week and strengthening exercises 2-3 times per week for cardiovascular health, weight loss maintenance and preservation of muscle mass.   She has agreed to Think about enjoyable ways to increase daily physical activity and overcoming barriers to exercise and Increase physical activity in their day and reduce sedentary time (increase NEAT). Aim for 30 min of walking 5 days/ wk  Pharmacotherapy changes for  the treatment of obesity: none  ASSOCIATED CONDITIONS ADDRESSED TODAY  Polyphagia Starting to improve on compounded semaglutide but still dealing with increased hunger as adderall wears off at night and sugar cravings.  Plan: she will be increasing semaglutide next week (this is not managed by our office) Increase protein and fiber with  meals to reduce nighttime hunger Keep high sugar foods/ drinks out of the house  Insulin resistance She did not start on metformin. She does plan to ramp up walking time  Continue to work on limiting sugar intake  Vitamin D deficiency Last vitamin D Lab Results  Component Value Date   VD25OH 13.4 (L) 12/10/2022   She is taking RX vitamin D weekly Repeat lab in 1-2 mos -     Vitamin D (Ergocalciferol); Take 1 capsule (50,000 Units total) by mouth every 7 (seven) days.  Dispense: 5 capsule; Refill: 0  Class 1 obesity due to excess calories with body mass index (BMI) of 32.0 to 32.9 in adult, unspecified whether serious comorbidity present      She was informed of the importance of frequent follow up visits to maximize her success with intensive lifestyle modifications for her multiple health conditions.   ATTESTASTION STATEMENTS:  Reviewed by clinician on day of visit: allergies, medications, problem list, medical history, surgical history, family history, social history, and previous encounter notes pertinent to obesity diagnosis.   I have personally spent 30 minutes total time today in preparation, patient care, nutritional counseling and documentation for this visit, including the following: review of clinical lab tests; review of medical tests/procedures/services.      Glennis Brink, DO DABFM, DABOM [facility] [address] [place], [place] [address] [phone]

## 2023-04-24 DIAGNOSIS — Z789 Other specified health status: Secondary | ICD-10-CM | POA: Diagnosis not present

## 2023-04-24 DIAGNOSIS — M25622 Stiffness of left elbow, not elsewhere classified: Secondary | ICD-10-CM | POA: Diagnosis not present

## 2023-04-24 DIAGNOSIS — S42352S Displaced comminuted fracture of shaft of humerus, left arm, sequela: Secondary | ICD-10-CM | POA: Diagnosis not present

## 2023-04-24 DIAGNOSIS — M25612 Stiffness of left shoulder, not elsewhere classified: Secondary | ICD-10-CM | POA: Diagnosis not present

## 2023-04-24 DIAGNOSIS — Z9889 Other specified postprocedural states: Secondary | ICD-10-CM | POA: Diagnosis not present

## 2023-04-24 DIAGNOSIS — S42352A Displaced comminuted fracture of shaft of humerus, left arm, initial encounter for closed fracture: Secondary | ICD-10-CM | POA: Diagnosis not present

## 2023-04-28 ENCOUNTER — Encounter: Payer: Self-pay | Admitting: Family Medicine

## 2023-04-28 DIAGNOSIS — S42352S Displaced comminuted fracture of shaft of humerus, left arm, sequela: Secondary | ICD-10-CM | POA: Diagnosis not present

## 2023-04-28 DIAGNOSIS — M25622 Stiffness of left elbow, not elsewhere classified: Secondary | ICD-10-CM | POA: Diagnosis not present

## 2023-04-28 DIAGNOSIS — Z9889 Other specified postprocedural states: Secondary | ICD-10-CM | POA: Diagnosis not present

## 2023-04-28 DIAGNOSIS — S42352A Displaced comminuted fracture of shaft of humerus, left arm, initial encounter for closed fracture: Secondary | ICD-10-CM | POA: Diagnosis not present

## 2023-04-28 DIAGNOSIS — M25612 Stiffness of left shoulder, not elsewhere classified: Secondary | ICD-10-CM | POA: Diagnosis not present

## 2023-04-28 DIAGNOSIS — Z789 Other specified health status: Secondary | ICD-10-CM | POA: Diagnosis not present

## 2023-04-28 NOTE — Progress Notes (Deleted)
 Follow Up Note  RE: Susan Boyle MRN: [id number] DOB: Jun 20, 1990 Date of Office Visit: 04/29/2023  Referring provider: Suzan Slick, MD Primary care provider: Suzan Slick, MD  Chief Complaint: No chief complaint on file.  History of Present Illness: I had the pleasure of seeing Susan Boyle for a follow up visit at the [facility] on 04/28/2023. She is a 33 y.o. female, who is being followed for asthma, allergic rhinoconjunctivitis, allergic reactions. Her previous allergy office visit was on 01/21/2023 with Dr. Selena Batten. Today is a regular follow up visit.  Discussed the use of AI scribe software for clinical note transcription with the patient, who gave verbal consent to proceed.  History of Present Illness            ***  Assessment and Plan: [name] is a 33 y.o. female with: Not well controlled moderate persistent asthma Past history - Diagnosed with asthma over 10+ years ago. Main triggers include exercise and cold weather. 2022 spirometry showed some restriction with 4% improvement in FEV1 post bronchodilator treatment.  Clinically feeling unchanged. Interim history - only using Advair 1 puff once a day. Patient reports better control with twice daily use. Discussed the possibility of switching to [facility] for once daily dosing.  Spacer given and demonstrated proper use with inhaler. Patient understood technique and all questions/concerned were addressed.  Nebulizer machine given.  Daily controller medication(s): START Breo 1 puff once a day and rinse mouth after each use. This replaces Advair. Let us know if it's not covered.  During respiratory infections/flares:  Pretreat with albuterol 2 puffs or albuterol nebulizer.  If you need to use your albuterol nebulizer machine back to back within 15-30 minutes with no relief then please go to the ER/urgent care for further evaluation.  May use albuterol rescue inhaler 2 puffs or nebulizer  every 4 to 6 hours as needed for shortness of breath, chest tightness, coughing, and wheezing. May use albuterol rescue inhaler 2 puffs 5 to 15 minutes prior to strenuous physical activities. Monitor frequency of use - if you need to use it more than twice per week on a consistent basis let us know. Get spirometry at next visit - not obtained today due to current respiratory infection.   Upper respiratory tract infection Patient has been sick for approximately 10 days with symptoms of fever, cough, and phlegm production. Patient was previously diagnosed with pneumonia/bronchitis and started on doxycycline and prednisone. However, symptoms have not improved. CXR was normal though. Start Zpak. Take with food and take probiotics.  Stop doxycycline. Continue prednisone 20mg  daily.  Use albuterol nebulizer twice a day for the next 1 week. See below for symptomatic management.    Seasonal and perennial allergic rhinoconjunctivitis Past history - Nasacort and Flonase causes headaches.  Skin testing as a child showed positive to multiple items per patient report and was on allergy immunotherapy for 2 years. 2022 skin testing positive to grass, weed pollen, ragweed pollen, trees, mold, and borderline to cat and dog.  Interim history - Patient reports good control with allergy shots and occasional use of over-the-counter allergy medications. Patient also reports eye allergies. Continue environmental control measures.  Continue allergy injections - skip while you are sick. Use over the counter antihistamines such as Zyrtec (cetirizine), Claritin (loratadine), Allegra (fexofenadine), or Xyzal (levocetirizine) daily as needed. May switch antihistamines every few months. Use cromolyn 4% 1 drop in each eye up to four times a day  as needed for itchy/watery eyes.  Use Flonase (fluticasone) nasal spray 1-2 sprays per nostril once a day as needed for nasal congestion.  Nasal saline spray (i.e., Simply Saline) or  nasal saline lavage (i.e., NeilMed) is recommended as needed and prior to medicated nasal sprays.   Allergic reaction, subsequent encounter Past history - 1 episode of allergic reaction in 2018 after eating a chunky clam and corn chowder canned soup at night.  The following morning developed left/facial swelling which eventually progressed to pruritus and hand/feet swelling.  She was treated in ER with epinephrine, Zantac, Benadryl and steroids with good benefit.  Patient has eaten all the ingredients that are in the canned soup since then without any issues and is currently not avoiding any foods.  No more additional allergic reaction since that event. Interim history - no reactions. Avoid the clam & corn chowder with bacon canned soup. If you have another reaction then let us know.  For mild symptoms you can take over the counter antihistamines such as Benadryl and monitor symptoms closely. If symptoms worsen or if you have severe symptoms including breathing issues, throat closure, significant swelling, whole body hives, severe diarrhea and vomiting, lightheadedness then inject epinephrine and seek immediate medical care afterwards. Action plan in place.    Return in about 3 months (around 04/23/2023). Assessment and Plan              No follow-ups on file.  No orders of the defined types were placed in this encounter.  Lab Orders  No laboratory test(s) ordered today    Diagnostics: Spirometry:  Tracings reviewed. Her effort: {Blank single:19197::"Good reproducible efforts.","It was hard to get consistent efforts and there is a question as to whether this reflects a maximal maneuver.","Poor effort, data can not be interpreted."} FVC: ***L FEV1: ***L, ***% predicted FEV1/FVC ratio: ***% Interpretation: {Blank single:19197::"Spirometry consistent with mild obstructive disease","Spirometry consistent with moderate obstructive disease","Spirometry consistent with severe obstructive  disease","Spirometry consistent with possible restrictive disease","Spirometry consistent with mixed obstructive and restrictive disease","Spirometry uninterpretable due to technique","Spirometry consistent with normal pattern","No overt abnormalities noted given today's efforts"}.  Please see scanned spirometry results for details.  Skin Testing: {Blank single:19197::"Select foods","Environmental allergy panel","Environmental allergy panel and select foods","Food allergy panel","None","Deferred due to recent antihistamines use"}. *** Results discussed with patient/family.   Medication List:  Current Outpatient Medications  Medication Sig Dispense Refill  . albuterol (PROVENTIL) (2.5 MG/3ML) 0.083% nebulizer solution Take 3 mLs (2.5 mg total) by nebulization every 4 (four) hours as needed for wheezing or shortness of breath (coughing fits). 90 mL 1  . albuterol (VENTOLIN HFA) 108 (90 Base) MCG/ACT inhaler Inhale 2 puffs into the lungs every 4 (four) hours as needed for wheezing or shortness of breath (coughing fits). 6.7 g 1  . aluminum chloride (DRYSOL) 20 % external solution Apply topically at bedtime as needed. 35 mL 0  . brompheniramine-pseudoephedrine-DM 30-2-10 MG/5ML syrup Take 5 mLs by mouth 4 (four) times daily as needed.    Marland Kitchen buPROPion (WELLBUTRIN XL) 150 MG 24 hr tablet Take 1 tablet (150 mg total) by mouth at bedtime. 90 tablet 3  . cromolyn (OPTICROM) 4 % ophthalmic solution Place 1 drop into both eyes 4 (four) times daily as needed (itchy/watery eyes). 10 mL 1  . EPINEPHrine (EPIPEN 2-PAK) 0.3 mg/0.3 mL IJ SOAJ injection Inject 0.3 mg into the muscle as needed for anaphylaxis. 2 each 1  . famotidine (PEPCID) 40 MG tablet Take 1 tablet (40 mg total) by mouth daily.  90 tablet 0  . fluticasone (FLONASE) 50 MCG/ACT nasal spray Place 1-2 sprays into both nostrils daily as needed (nasal congestion). 16 g 5  . fluticasone-salmeterol (ADVAIR) 250-50 MCG/ACT AEPB Inhale 1 puff into the lungs  in the morning and at bedtime. 60 each 5  . gabapentin (NEURONTIN) 100 MG capsule Take 1 capsule (100 mg total) by mouth 3 (three) times daily for 14 days 42 capsule 0  . hydrOXYzine (ATARAX) 10 MG tablet Take 1 tablet (10 mg total) by mouth 3 (three) times daily as needed for itching. 30 tablet 0  . levonorgestrel (MIRENA) 20 MCG/24HR IUD 1 each by Intrauterine route once.    . lisdexamfetamine (VYVANSE) 60 MG capsule Take 1 capsule (60 mg total) by mouth every morning. 90 capsule 0  . nystatin-triamcinolone ointment (MYCOLOG) Apply 1 Application topically 2 (two) times daily. 30 g 0  . PARoxetine (PAXIL) 20 MG tablet Take 1 tablet (20 mg total) by mouth at bedtime. 90 tablet 3  . traZODone (DESYREL) 50 MG tablet Take 1-3 tablets (50-150 mg total) by mouth at bedtime as needed. 270 tablet 3  . Vitamin D, Ergocalciferol, (DRISDOL) 1.25 MG (50000 UNIT) CAPS capsule Take 1 capsule (50,000 Units total) by mouth every 7 (seven) days. 5 capsule 0   No current facility-administered medications for this visit.   Allergies: Allergies  Allergen Reactions  . Sulfamethoxazole Nausea And Vomiting   I reviewed her past medical history, social history, family history, and environmental history and no significant changes have been reported from her previous visit.  Review of Systems  Constitutional:  Positive for fever. Negative for appetite change, chills and unexpected weight change.  HENT:  Positive for congestion and rhinorrhea.   Eyes:  Positive for itching.  Respiratory:  Positive for cough, chest tightness and shortness of breath.   Cardiovascular:  Negative for chest pain.  Gastrointestinal:  Negative for abdominal pain.  Genitourinary:  Negative for difficulty urinating.  Skin:  Negative for rash.  Allergic/Immunologic: Positive for environmental allergies.  Neurological:  Negative for headaches.   Objective: There were no vitals taken for this visit. There is no height or weight on file  to calculate BMI. Physical Exam Vitals and nursing note reviewed.  Constitutional:      Appearance: Normal appearance. She is well-developed.  HENT:     Head: Normocephalic and atraumatic.     Right Ear: Tympanic membrane and external ear normal.     Left Ear: Tympanic membrane and external ear normal.     Nose: Nose normal.     Mouth/Throat:     Mouth: Mucous membranes are moist.     Pharynx: Oropharynx is clear.  Eyes:     Conjunctiva/sclera: Conjunctivae normal.  Cardiovascular:     Rate and Rhythm: Normal rate and regular rhythm.     Heart sounds: Normal heart sounds. No murmur heard.    No friction rub. No gallop.  Pulmonary:     Effort: Pulmonary effort is normal.     Breath sounds: Normal breath sounds. No wheezing, rhonchi or rales.  Musculoskeletal:     Cervical back: Neck supple.  Skin:    General: Skin is warm.     Findings: No rash.  Neurological:     Mental Status: She is alert and oriented to person, place, and time.  Psychiatric:        Behavior: Behavior normal.  Previous notes and tests were reviewed. The plan was reviewed with the patient/family, and all questions/concerned  were addressed.  It was my pleasure to see Susan Boyle today and participate in her care. Please feel free to contact me with any questions or concerns.  Sincerely,  Wyline Mood, DO Allergy & Immunology  [facility] office: [phone] [facility] office: [phone]

## 2023-04-29 ENCOUNTER — Ambulatory Visit: Payer: 59 | Admitting: Allergy

## 2023-04-30 ENCOUNTER — Other Ambulatory Visit (HOSPITAL_COMMUNITY): Payer: Self-pay

## 2023-04-30 ENCOUNTER — Other Ambulatory Visit: Payer: Self-pay

## 2023-04-30 ENCOUNTER — Encounter (HOSPITAL_COMMUNITY): Payer: Self-pay

## 2023-04-30 MED ORDER — AMPHETAMINE-DEXTROAMPHETAMINE 20 MG PO TABS
20.0000 mg | ORAL_TABLET | Freq: Three times a day (TID) | ORAL | 0 refills | Status: AC
  Filled 2023-04-30 – 2023-05-01 (×3): qty 90, 30d supply, fill #0

## 2023-05-01 ENCOUNTER — Other Ambulatory Visit: Payer: Self-pay

## 2023-05-01 ENCOUNTER — Other Ambulatory Visit (HOSPITAL_COMMUNITY): Payer: Self-pay

## 2023-05-01 DIAGNOSIS — S42352S Displaced comminuted fracture of shaft of humerus, left arm, sequela: Secondary | ICD-10-CM | POA: Diagnosis not present

## 2023-05-01 DIAGNOSIS — S42352A Displaced comminuted fracture of shaft of humerus, left arm, initial encounter for closed fracture: Secondary | ICD-10-CM | POA: Diagnosis not present

## 2023-05-01 DIAGNOSIS — Z789 Other specified health status: Secondary | ICD-10-CM | POA: Diagnosis not present

## 2023-05-01 DIAGNOSIS — M25612 Stiffness of left shoulder, not elsewhere classified: Secondary | ICD-10-CM | POA: Diagnosis not present

## 2023-05-01 DIAGNOSIS — M25622 Stiffness of left elbow, not elsewhere classified: Secondary | ICD-10-CM | POA: Diagnosis not present

## 2023-05-01 DIAGNOSIS — Z9889 Other specified postprocedural states: Secondary | ICD-10-CM | POA: Diagnosis not present

## 2023-05-01 MED ORDER — BENZONATATE 200 MG PO CAPS
200.0000 mg | ORAL_CAPSULE | Freq: Three times a day (TID) | ORAL | 0 refills | Status: DC | PRN
  Filled 2023-05-01: qty 15, 5d supply, fill #0

## 2023-05-01 MED ORDER — VITAMIN D (ERGOCALCIFEROL) 1.25 MG (50000 UNIT) PO CAPS
50000.0000 [IU] | ORAL_CAPSULE | ORAL | 0 refills | Status: DC
  Filled 2023-05-01: qty 5, 35d supply, fill #0

## 2023-05-01 MED ORDER — FLUTICASONE-SALMETEROL 250-50 MCG/ACT IN AEPB
1.0000 | INHALATION_SPRAY | RESPIRATORY_TRACT | 0 refills | Status: DC
  Filled 2023-05-01: qty 60, 30d supply, fill #0

## 2023-05-01 MED ORDER — GABAPENTIN 300 MG PO CAPS
300.0000 mg | ORAL_CAPSULE | Freq: Three times a day (TID) | ORAL | 3 refills | Status: AC
  Filled 2023-05-01 – 2023-05-06 (×2): qty 270, 90d supply, fill #0
  Filled 2023-06-22 – 2023-07-04 (×4): qty 270, 90d supply, fill #1

## 2023-05-01 MED ORDER — ALBUTEROL SULFATE HFA 108 (90 BASE) MCG/ACT IN AERS
2.0000 | INHALATION_SPRAY | Freq: Four times a day (QID) | RESPIRATORY_TRACT | 2 refills | Status: DC | PRN
  Filled 2023-05-01: qty 6.7, 10d supply, fill #0

## 2023-05-04 DIAGNOSIS — F4312 Post-traumatic stress disorder, chronic: Secondary | ICD-10-CM | POA: Diagnosis not present

## 2023-05-05 ENCOUNTER — Encounter: Payer: Self-pay | Admitting: Family Medicine

## 2023-05-05 ENCOUNTER — Ambulatory Visit: Admitting: Family Medicine

## 2023-05-05 ENCOUNTER — Other Ambulatory Visit (HOSPITAL_COMMUNITY): Payer: Self-pay

## 2023-05-05 ENCOUNTER — Encounter (HOSPITAL_COMMUNITY): Payer: Self-pay

## 2023-05-05 VITALS — BP 126/80 | HR 94 | Temp 98.3°F | Resp 18 | Ht 63.0 in | Wt 189.0 lb

## 2023-05-05 DIAGNOSIS — S42352A Displaced comminuted fracture of shaft of humerus, left arm, initial encounter for closed fracture: Secondary | ICD-10-CM | POA: Diagnosis not present

## 2023-05-05 DIAGNOSIS — S42352S Displaced comminuted fracture of shaft of humerus, left arm, sequela: Secondary | ICD-10-CM | POA: Diagnosis not present

## 2023-05-05 DIAGNOSIS — M25612 Stiffness of left shoulder, not elsewhere classified: Secondary | ICD-10-CM | POA: Diagnosis not present

## 2023-05-05 DIAGNOSIS — K921 Melena: Secondary | ICD-10-CM

## 2023-05-05 DIAGNOSIS — Z9889 Other specified postprocedural states: Secondary | ICD-10-CM | POA: Diagnosis not present

## 2023-05-05 DIAGNOSIS — Z789 Other specified health status: Secondary | ICD-10-CM | POA: Diagnosis not present

## 2023-05-05 DIAGNOSIS — M25622 Stiffness of left elbow, not elsewhere classified: Secondary | ICD-10-CM | POA: Diagnosis not present

## 2023-05-05 NOTE — Progress Notes (Signed)
   Established Patient Office Visit  Subjective   Patient ID: Susan Boyle, female    DOB: [birth year]  Age: 33 y.o. MRN: [id number]  No chief complaint on file.   HPI  Weight management Pt reports she started Ozempic 0.25mg  for weight loss around Feb 5th. She then started with blood in her stool. She messaged me on 3/4 and was advised to stop the Ozempic. She reports she no longer has had blood in her stool. She wondered if this was a common side effect from the medicine.    Review of Systems  Gastrointestinal:  Positive for blood in stool.  All other systems reviewed and are negative.    Objective:     There were no vitals taken for this visit. BP Readings from Last 3 Encounters:  05/05/23 126/80  04/22/23 122/78  03/25/23 108/72      Physical Exam Vitals and nursing note reviewed.  Constitutional:      Appearance: Normal appearance. She is normal weight.  HENT:     Head: Normocephalic and atraumatic.     Right Ear: External ear normal.     Left Ear: External ear normal.     Nose: Nose normal.     Mouth/Throat:     Mouth: Mucous membranes are moist.     Pharynx: Oropharynx is clear.  Eyes:     Conjunctiva/sclera: Conjunctivae normal.     Pupils: Pupils are equal, round, and reactive to light.  Cardiovascular:     Rate and Rhythm: Normal rate.  Pulmonary:     Effort: Pulmonary effort is normal.  Skin:    General: Skin is warm.     Capillary Refill: Capillary refill takes less than 2 seconds.  Neurological:     General: No focal deficit present.     Mental Status: She is alert and oriented to person, place, and time. Mental status is at baseline.  Psychiatric:        Mood and Affect: Mood normal.        Behavior: Behavior normal.        Thought Content: Thought content normal.        Judgment: Judgment normal.    No results found for any visits on 05/05/23.     The ASCVD Risk score (Arnett DK, et al., 2019) failed to calculate for the following  reasons:   The 2019 ASCVD risk score is only valid for ages 54 to 67    Assessment & Plan:   Problem List Items Addressed This Visit   None  Blood in stool   Pt with reported blood in stool on numerous occasions after started Ozempic for weight loss. Since being advised to stop the injection last week, she no longer has seen blood in her stool. Have advised pt to continue to stay off of this for now.  Follow up with me in 2 weeks on status. No follow-ups on file.    Suzan Slick, MD

## 2023-05-06 ENCOUNTER — Other Ambulatory Visit (HOSPITAL_COMMUNITY): Payer: Self-pay

## 2023-05-12 DIAGNOSIS — M25612 Stiffness of left shoulder, not elsewhere classified: Secondary | ICD-10-CM | POA: Diagnosis not present

## 2023-05-12 DIAGNOSIS — Z789 Other specified health status: Secondary | ICD-10-CM | POA: Diagnosis not present

## 2023-05-12 DIAGNOSIS — S42352S Displaced comminuted fracture of shaft of humerus, left arm, sequela: Secondary | ICD-10-CM | POA: Diagnosis not present

## 2023-05-12 DIAGNOSIS — M25622 Stiffness of left elbow, not elsewhere classified: Secondary | ICD-10-CM | POA: Diagnosis not present

## 2023-05-12 DIAGNOSIS — S42352A Displaced comminuted fracture of shaft of humerus, left arm, initial encounter for closed fracture: Secondary | ICD-10-CM | POA: Diagnosis not present

## 2023-05-12 DIAGNOSIS — Z9889 Other specified postprocedural states: Secondary | ICD-10-CM | POA: Diagnosis not present

## 2023-05-12 NOTE — Progress Notes (Deleted)
 Follow Up Note  RE: Susan Boyle MRN: [id number] DOB: [birth year] Date of Office Visit: 05/13/2023  Referring provider: Suzan Slick, MD Primary care provider: Suzan Slick, MD  Chief Complaint: No chief complaint on file.  History of Present Illness: I had the pleasure of seeing Susan Boyle for a follow up visit at the [facility] on 05/12/2023. She is a 33 y.o. female, who is being followed for asthma, allergic rhinoconjunctivitis, allergic reactions. Her previous allergy office visit was on 01/21/2023 with Dr. Selena Batten. Today is a regular follow up visit.  Discussed the use of AI scribe software for clinical note transcription with the patient, who gave verbal consent to proceed.  History of Present Illness            ***  Assessment and Plan: Guiselle is a 33 y.o. female with: Not well controlled moderate persistent asthma Past history - Diagnosed with asthma over 10+ years ago. Main triggers include exercise and cold weather. 2022 spirometry showed some restriction with 4% improvement in FEV1 post bronchodilator treatment.  Clinically feeling unchanged. Interim history - only using Advair 1 puff once a day. Patient reports better control with twice daily use. Discussed the possibility of switching to [facility] for once daily dosing.  Spacer given and demonstrated proper use with inhaler. Patient understood technique and all questions/concerned were addressed.  Nebulizer machine given.  Daily controller medication(s): START Breo 1 puff once a day and rinse mouth after each use. This replaces Advair. Let us know if it's not covered.  During respiratory infections/flares:  Pretreat with albuterol 2 puffs or albuterol nebulizer.  If you need to use your albuterol nebulizer machine back to back within 15-30 minutes with no relief then please go to the ER/urgent care for further evaluation.  May use albuterol rescue inhaler 2 puffs or nebulizer  every 4 to 6 hours as needed for shortness of breath, chest tightness, coughing, and wheezing. May use albuterol rescue inhaler 2 puffs 5 to 15 minutes prior to strenuous physical activities. Monitor frequency of use - if you need to use it more than twice per week on a consistent basis let us know. Get spirometry at next visit - not obtained today due to current respiratory infection.   Upper respiratory tract infection Patient has been sick for approximately 10 days with symptoms of fever, cough, and phlegm production. Patient was previously diagnosed with pneumonia/bronchitis and started on doxycycline and prednisone. However, symptoms have not improved. CXR was normal though. Start Zpak. Take with food and take probiotics.  Stop doxycycline. Continue prednisone 20mg  daily.  Use albuterol nebulizer twice a day for the next 1 week. See below for symptomatic management.    Seasonal and perennial allergic rhinoconjunctivitis Past history - Nasacort and Flonase causes headaches.  Skin testing as a child showed positive to multiple items per patient report and was on allergy immunotherapy for 2 years. 2022 skin testing positive to grass, weed pollen, ragweed pollen, trees, mold, and borderline to cat and dog.  Interim history - Patient reports good control with allergy shots and occasional use of over-the-counter allergy medications. Patient also reports eye allergies. Continue environmental control measures.  Continue allergy injections - skip while you are sick. Use over the counter antihistamines such as Zyrtec (cetirizine), Claritin (loratadine), Allegra (fexofenadine), or Xyzal (levocetirizine) daily as needed. May switch antihistamines every few months. Use cromolyn 4% 1 drop in each eye up to four times a day  as needed for itchy/watery eyes.  Use Flonase (fluticasone) nasal spray 1-2 sprays per nostril once a day as needed for nasal congestion.  Nasal saline spray (i.e., Simply Saline) or  nasal saline lavage (i.e., NeilMed) is recommended as needed and prior to medicated nasal sprays.   Allergic reaction, subsequent encounter Past history - 1 episode of allergic reaction in 2018 after eating a chunky clam and corn chowder canned soup at night.  The following morning developed left/facial swelling which eventually progressed to pruritus and hand/feet swelling.  She was treated in ER with epinephrine, Zantac, Benadryl and steroids with good benefit.  Patient has eaten all the ingredients that are in the canned soup since then without any issues and is currently not avoiding any foods.  No more additional allergic reaction since that event. Interim history - no reactions. Avoid the clam & corn chowder with bacon canned soup. If you have another reaction then let us know.  For mild symptoms you can take over the counter antihistamines such as Benadryl and monitor symptoms closely. If symptoms worsen or if you have severe symptoms including breathing issues, throat closure, significant swelling, whole body hives, severe diarrhea and vomiting, lightheadedness then inject epinephrine and seek immediate medical care afterwards. Action plan in place.    Return in about 3 months (around 04/23/2023). Assessment and Plan              No follow-ups on file.  No orders of the defined types were placed in this encounter.  Lab Orders  No laboratory test(s) ordered today    Diagnostics: Spirometry:  Tracings reviewed. Her effort: {Blank single:19197::"Good reproducible efforts.","It was hard to get consistent efforts and there is a question as to whether this reflects a maximal maneuver.","Poor effort, data can not be interpreted."} FVC: ***L FEV1: ***L, ***% predicted FEV1/FVC ratio: ***% Interpretation: {Blank single:19197::"Spirometry consistent with mild obstructive disease","Spirometry consistent with moderate obstructive disease","Spirometry consistent with severe obstructive  disease","Spirometry consistent with possible restrictive disease","Spirometry consistent with mixed obstructive and restrictive disease","Spirometry uninterpretable due to technique","Spirometry consistent with normal pattern","No overt abnormalities noted given today's efforts"}.  Please see scanned spirometry results for details.  Skin Testing: {Blank single:19197::"Select foods","Environmental allergy panel","Environmental allergy panel and select foods","Food allergy panel","None","Deferred due to recent antihistamines use"}. *** Results discussed with patient/family.   Medication List:  Current Outpatient Medications  Medication Sig Dispense Refill   albuterol (PROVENTIL) (2.5 MG/3ML) 0.083% nebulizer solution Take 3 mLs (2.5 mg total) by nebulization every 4 (four) hours as needed for wheezing or shortness of breath (coughing fits). 90 mL 1   albuterol (VENTOLIN HFA) 108 (90 Base) MCG/ACT inhaler Inhale 2 puffs into the lungs every 4 (four) hours as needed for wheezing or shortness of breath (coughing fits). 6.7 g 1   amphetamine-dextroamphetamine (ADDERALL) 20 MG tablet Take 1 tablet (20 mg total) by mouth 3 (three) times daily in the morning, at 12 (noon), and 4 (four) pm. 90 tablet 0   buPROPion (WELLBUTRIN XL) 150 MG 24 hr tablet Take 1 tablet (150 mg total) by mouth at bedtime. 90 tablet 3   cromolyn (OPTICROM) 4 % ophthalmic solution Place 1 drop into both eyes 4 (four) times daily as needed (itchy/watery eyes). 10 mL 1   EPINEPHrine (EPIPEN 2-PAK) 0.3 mg/0.3 mL IJ SOAJ injection Inject 0.3 mg into the muscle as needed for anaphylaxis. 2 each 1   famotidine (PEPCID) 40 MG tablet Take 1 tablet (40 mg total) by mouth daily. 90 tablet 0  fluticasone (FLONASE) 50 MCG/ACT nasal spray Place 1-2 sprays into both nostrils daily as needed (nasal congestion). 16 g 5   fluticasone-salmeterol (ADVAIR) 250-50 MCG/ACT AEPB Inhale 1 puff into the lungs in the morning and at bedtime. 60 each 5    gabapentin (NEURONTIN) 300 MG capsule Take 1 capsule (300 mg total) by mouth 3 (three) times daily. 270 capsule 3   hydrOXYzine (ATARAX) 10 MG tablet Take 1 tablet (10 mg total) by mouth 3 (three) times daily as needed for itching. 30 tablet 0   levonorgestrel (MIRENA) 20 MCG/24HR IUD 1 each by Intrauterine route once.     lisdexamfetamine (VYVANSE) 60 MG capsule Take 1 capsule (60 mg total) by mouth every morning. 90 capsule 0   nystatin-triamcinolone ointment (MYCOLOG) Apply 1 Application topically 2 (two) times daily. 30 g 0   PARoxetine (PAXIL) 20 MG tablet Take 1 tablet (20 mg total) by mouth at bedtime. 90 tablet 3   SEMAGLUTIDE, 1 MG/DOSE, [place]  (Patient not taking: Reported on 05/05/2023)     Vitamin D, Ergocalciferol, (DRISDOL) 1.25 MG (50000 UNIT) CAPS capsule Take 1 capsule (50,000 Units total) by mouth every 7 (seven) days. 5 capsule 0   No current facility-administered medications for this visit.   Allergies: Allergies  Allergen Reactions   Sulfamethoxazole Nausea And Vomiting   I reviewed her past medical history, social history, family history, and environmental history and no significant changes have been reported from her previous visit.  Review of Systems  Constitutional:  Positive for fever. Negative for appetite change, chills and unexpected weight change.  HENT:  Positive for congestion and rhinorrhea.   Eyes:  Positive for itching.  Respiratory:  Positive for cough, chest tightness and shortness of breath.   Cardiovascular:  Negative for chest pain.  Gastrointestinal:  Negative for abdominal pain.  Genitourinary:  Negative for difficulty urinating.  Skin:  Negative for rash.  Allergic/Immunologic: Positive for environmental allergies.  Neurological:  Negative for headaches.    Objective: There were no vitals taken for this visit. There is no height or weight on file to calculate BMI. Physical Exam Vitals and nursing note reviewed.  Constitutional:       Appearance: Normal appearance. She is well-developed.  HENT:     Head: Normocephalic and atraumatic.     Right Ear: Tympanic membrane and external ear normal.     Left Ear: Tympanic membrane and external ear normal.     Nose: Nose normal.     Mouth/Throat:     Mouth: Mucous membranes are moist.     Pharynx: Oropharynx is clear.  Eyes:     Conjunctiva/sclera: Conjunctivae normal.  Cardiovascular:     Rate and Rhythm: Normal rate and regular rhythm.     Heart sounds: Normal heart sounds. No murmur heard.    No friction rub. No gallop.  Pulmonary:     Effort: Pulmonary effort is normal.     Breath sounds: Normal breath sounds. No wheezing, rhonchi or rales.  Musculoskeletal:     Cervical back: Neck supple.  Skin:    General: Skin is warm.     Findings: No rash.  Neurological:     Mental Status: She is alert and oriented to person, place, and time.  Psychiatric:        Behavior: Behavior normal.   Previous notes and tests were reviewed. The plan was reviewed with the patient/family, and all questions/concerned were addressed.  It was my pleasure to see Lilith today and participate  in her care. Please feel free to contact me with any questions or concerns.  Sincerely,  Wyline Mood, DO Allergy & Immunology  [facility] office: [phone] [facility] office: [phone]

## 2023-05-13 ENCOUNTER — Ambulatory Visit: Admitting: Allergy

## 2023-05-13 DIAGNOSIS — F4312 Post-traumatic stress disorder, chronic: Secondary | ICD-10-CM | POA: Diagnosis not present

## 2023-05-15 DIAGNOSIS — Z9889 Other specified postprocedural states: Secondary | ICD-10-CM | POA: Diagnosis not present

## 2023-05-15 DIAGNOSIS — Z789 Other specified health status: Secondary | ICD-10-CM | POA: Diagnosis not present

## 2023-05-15 DIAGNOSIS — M25622 Stiffness of left elbow, not elsewhere classified: Secondary | ICD-10-CM | POA: Diagnosis not present

## 2023-05-15 DIAGNOSIS — M25612 Stiffness of left shoulder, not elsewhere classified: Secondary | ICD-10-CM | POA: Diagnosis not present

## 2023-05-15 DIAGNOSIS — S42352A Displaced comminuted fracture of shaft of humerus, left arm, initial encounter for closed fracture: Secondary | ICD-10-CM | POA: Diagnosis not present

## 2023-05-15 DIAGNOSIS — S42352S Displaced comminuted fracture of shaft of humerus, left arm, sequela: Secondary | ICD-10-CM | POA: Diagnosis not present

## 2023-05-18 ENCOUNTER — Encounter: Payer: Self-pay | Admitting: Nurse Practitioner

## 2023-05-18 ENCOUNTER — Ambulatory Visit: Payer: Commercial Managed Care - PPO | Admitting: Nurse Practitioner

## 2023-05-18 ENCOUNTER — Telehealth: Admitting: Physician Assistant

## 2023-05-18 DIAGNOSIS — J069 Acute upper respiratory infection, unspecified: Secondary | ICD-10-CM | POA: Diagnosis not present

## 2023-05-18 DIAGNOSIS — F4312 Post-traumatic stress disorder, chronic: Secondary | ICD-10-CM | POA: Diagnosis not present

## 2023-05-18 MED ORDER — PSEUDOEPH-BROMPHEN-DM 30-2-10 MG/5ML PO SYRP: 5.0000 mL | ORAL_SOLUTION | Freq: Four times a day (QID) | ORAL | 0 refills | Status: DC | PRN

## 2023-05-18 NOTE — Progress Notes (Signed)
 E-Visit for Upper Respiratory Infection   We are sorry you are not feeling well.  Here is how we plan to help!  Based on what you have shared with me, it looks like you may have a viral upper respiratory infection.  Upper respiratory infections are caused by a large number of viruses; however, rhinovirus is the most common cause.   Symptoms vary from person to person, with common symptoms including sore throat, cough, fatigue or lack of energy and feeling of general discomfort.  A low-grade fever of up to 100.4 may present, but is often uncommon.  Symptoms vary however, and are closely related to a person's age or underlying illnesses.  The most common symptoms associated with an upper respiratory infection are nasal discharge or congestion, cough, sneezing, headache and pressure in the ears and face.  These symptoms usually persist for about 3 to 10 days, but can last up to 2 weeks.  It is important to know that upper respiratory infections do not cause serious illness or complications in most cases.    Upper respiratory infections can be transmitted from person to person, with the most common method of transmission being a person's hands.  The virus is able to live on the skin and can infect other persons for up to 2 hours after direct contact.  Also, these can be transmitted when someone coughs or sneezes; thus, it is important to cover the mouth to reduce this risk.  To keep the spread of the illness at bay, good hand hygiene is very important.  This is an infection that is most likely caused by a virus. There are no specific treatments other than to help you with the symptoms until the infection runs its course.  We are sorry you are not feeling well.  Here is how we plan to help!   For nasal congestion, you may use an oral decongestants such as Mucinex D or if you have glaucoma or high blood pressure use plain Mucinex.  Saline nasal spray or nasal drops can help and can safely be used as often as  needed for congestion.  For your congestion, you can use Fluticasone nasal spray one spray in each nostril twice a day  If you do not have a history of heart disease, hypertension, diabetes or thyroid disease, prostate/bladder issues or glaucoma, you may also use Sudafed to treat nasal congestion.  It is highly recommended that you consult with a pharmacist or your primary care physician to ensure this medication is safe for you to take.     For cough I have prescribed for you Bromfed DM cough syrup Take 5mL every 6 hours as needed for cough.  If you have a sore or scratchy throat, use a saltwater gargle-  to  teaspoon of salt dissolved in a 4-ounce to 8-ounce glass of warm water.  Gargle the solution for approximately 15-30 seconds and then spit.  It is important not to swallow the solution.  You can also use throat lozenges/cough drops and Chloraseptic spray to help with throat pain or discomfort.  Warm or cold liquids can also be helpful in relieving throat pain.  For headache, pain or general discomfort, you can use Ibuprofen or Tylenol as directed.   Some authorities believe that zinc sprays or the use of Echinacea may shorten the course of your symptoms.   HOME CARE Only take medications as instructed by your medical team. Be sure to drink plenty of fluids. Water is fine as well  as fruit juices, sodas and electrolyte beverages. You may want to stay away from caffeine or alcohol. If you are nauseated, try taking small sips of liquids. How do you know if you are getting enough fluid? Your urine should be a pale yellow or almost colorless. Get rest. Taking a steamy shower or using a humidifier may help nasal congestion and ease sore throat pain. You can place a towel over your head and breathe in the steam from hot water coming from a faucet. Using a saline nasal spray works much the same way. Cough drops, hard candies and sore throat lozenges may ease your cough. Avoid close contacts  especially the very young and the elderly Cover your mouth if you cough or sneeze Always remember to wash your hands.   GET HELP RIGHT AWAY IF: You develop worsening fever. If your symptoms do not improve within 10 days You develop yellow or green discharge from your nose over 3 days. You have coughing fits You develop a severe head ache or visual changes. You develop shortness of breath, difficulty breathing or start having chest pain Your symptoms persist after you have completed your treatment plan  MAKE SURE YOU  Understand these instructions. Will watch your condition. Will get help right away if you are not doing well or get worse.  Thank you for choosing an e-visit.  Your e-visit answers were reviewed by a board certified advanced clinical practitioner to complete your personal care plan. Depending upon the condition, your plan could have included both over the counter or prescription medications.  Please review your pharmacy choice. Make sure the pharmacy is open so you can pick up prescription now. If there is a problem, you may contact your provider through [employer] and have the prescription routed to another pharmacy.  Your safety is important to [place]. If you have drug allergies check your prescription carefully.   For the next 24 hours you can use MyChart to ask questions about today's visit, request a non-urgent call back, or ask for a work or school excuse. You will get an email in the next two days asking about your experience. I hope that your e-visit has been valuable and will speed your recovery.    I have spent 5 minutes in review of e-visit questionnaire, review and updating patient chart, medical decision making and response to patient.   Margaretann Loveless, PA-C

## 2023-05-18 NOTE — Progress Notes (Unsigned)
 Office: [phone]  /  Fax: [phone]  WEIGHT SUMMARY AND BIOMETRICS  Weight Lost Since Last Visit: 0lb  Weight Gained Since Last Visit: 0lb   Vitals Temp: 98 F (36.7 C) BP: 118/70 Pulse Rate: 89 SpO2: 100 %   Anthropometric Measurements Height: 5\' 3"  (1.6 m) Weight: 185 lb (83.9 kg) BMI (Calculated): 32.78 Weight at Last Visit: 185lb Weight Lost Since Last Visit: 0lb Weight Gained Since Last Visit: 0lb Starting Weight: 180lb Total Weight Loss (lbs): 0 lb (0 kg)   Body Composition  Body Fat %: 43.1 % Fat Mass (lbs): 80 lbs Muscle Mass (lbs): 100 lbs Total Body Water (lbs): 73.6 lbs Visceral Fat Rating : 8   Other Clinical Data Fasting: No Labs: No Today's Visit #: 6 Starting Date: 12/10/22     HPI  Chief Complaint: OBESITY  Susan Boyle is here to discuss her progress with her obesity treatment plan. She is on the the Category 2 Plan and states she is following her eating plan approximately 90 % of the time. She states she is exercising 60 minutes 2-3 days per week.   Interval History:  Since last office visit she has maintained her weight.  She still has limited use of her L arm from L humerus fracture with nerve damage-she is doing OT twice per week. She is not back to work yet.  She is taking Gabapentin 300mg  TID    Pharmacotherapy for weight loss: She is not currently taking medications  for medical weight loss.  She stopped taking compounding Semaglutide Feb 26th due to side effects of blood in her stool. Denies blood in her stools since stopping Ozempic.    Previous pharmacotherapy for medical weight loss:  None  Bariatric surgery:  Patient has not had bariatric surgery.   PHYSICAL EXAM:  Blood pressure 118/70, pulse 89, temperature 98 F (36.7 C), height 5\' 3"  (1.6 m), weight 185 lb (83.9 kg), SpO2 100%. Body mass index is 32.77 kg/m.  General: She is overweight, cooperative, alert, well developed, and in no acute distress. PSYCH: Has  normal mood, affect and thought process.   Extremities: No edema.  Neurologic: No gross sensory or motor deficits. No tremors or fasciculations noted.    DIAGNOSTIC DATA REVIEWED:  BMET    Component Value Date/Time   NA 138 12/10/2022 0849   K 4.4 12/10/2022 0849   CL 101 12/10/2022 0849   CO2 22 12/10/2022 0849   GLUCOSE 91 12/10/2022 0849   GLUCOSE 101 (H) 03/05/2021 0811   BUN 17 12/10/2022 0849   CREATININE 0.81 12/10/2022 0849   CALCIUM 9.5 12/10/2022 0849   GFRNONAA >60 03/05/2021 0800   GFRAA >90 07/05/2013 2040   Lab Results  Component Value Date   HGBA1C 5.1 12/10/2022   HGBA1C 5.2 09/17/2022   Lab Results  Component Value Date   INSULIN 14.1 12/10/2022   Lab Results  Component Value Date   TSH 2.320 02/04/2023   CBC    Component Value Date/Time   WBC 5.3 12/10/2022 0849   WBC 5.1 03/05/2021 0800   RBC 4.22 12/10/2022 0849   RBC 4.28 03/05/2021 0800   HGB 12.7 12/10/2022 0849   HCT 39.4 12/10/2022 0849   PLT 205 12/10/2022 0849   MCV 93 12/10/2022 0849   MCH 30.1 12/10/2022 0849   MCH 29.9 03/05/2021 0800   MCHC 32.2 12/10/2022 0849   MCHC 33.2 03/05/2021 0800   RDW 12.7 12/10/2022 0849   Iron Studies    Component Value  Date/Time   FERRITIN 108 12/10/2022 0849   Lipid Panel     Component Value Date/Time   CHOL 190 12/10/2022 0849   TRIG 57 12/10/2022 0849   HDL 72 12/10/2022 0849   CHOLHDL 2.6 12/10/2022 0849   LDLCALC 107 (H) 12/10/2022 0849   Hepatic Function Panel     Component Value Date/Time   PROT 7.2 12/10/2022 0849   ALBUMIN 4.9 12/10/2022 0849   AST 16 12/10/2022 0849   ALT 18 12/10/2022 0849   ALKPHOS 51 12/10/2022 0849   BILITOT 0.4 12/10/2022 0849      Component Value Date/Time   TSH 2.320 02/04/2023 1033   Nutritional Lab Results  Component Value Date   VD25OH 13.4 (L) 12/10/2022     ASSESSMENT AND PLAN  TREATMENT PLAN FOR OBESITY:  Recommended Dietary Goals  Susan Boyle is currently in the action stage of  change. As such, her goal is to continue weight management plan. She has agreed to the Category 2 Plan.  Behavioral Intervention  We discussed the following Behavioral Modification Strategies today: increasing lean protein intake to established goals, decreasing simple carbohydrates , increasing vegetables, increasing water intake , and continue to work on maintaining a reduced calorie state, getting the recommended amount of protein, incorporating whole foods, making healthy choices, staying well hydrated and practicing mindfulness when eating..  Additional resources provided today: NA  Recommended Physical Activity Goals  Susan Boyle has been advised to work up to 150 minutes of moderate intensity aerobic activity a week and strengthening exercises 2-3 times per week for cardiovascular health, weight loss maintenance and preservation of muscle mass.   She has agreed to Think about enjoyable ways to increase daily physical activity and overcoming barriers to exercise, Increase physical activity in their day and reduce sedentary time (increase NEAT)., Increase the intensity, frequency or duration of strengthening exercises , and Increase the intensity, frequency or duration of aerobic exercises     Pharmacotherapy We discussed various medication options to help Susan Boyle with her weight loss efforts and we both agreed to ***. Patient has an IUD.    Avoid Qsymia due to taking Adderall  ASSOCIATED CONDITIONS ADDRESSED TODAY  Action/Plan  There are no diagnoses linked to this encounter.    Will obtain labs at next visit.    No follow-ups on file.Marland Kitchen She was informed of the importance of frequent follow up visits to maximize her success with intensive lifestyle modifications for her multiple health conditions.   ATTESTASTION STATEMENTS:  Reviewed by clinician on day of visit: allergies, medications, problem list, medical history, surgical history, family history, social history, and previous  encounter notes.   Time spent on visit including pre-visit chart review and post-visit care and charting was *** minutes.    Theodis Sato. Susan Marone FNP-C

## 2023-05-18 NOTE — Patient Instructions (Signed)
What is Qsymia and how does it work?  Qsymia is a prescription only medicine to help with your weight loss. It is a combination of two medicines that are low dose, long-acting: Phentermine & Topiramate. Qsymia contains low dose Phentermine which is a stimulant medicine that could affect your heart rate and blood pressure Qsymia is designed to help you feel satisfied faster that will help you to decrease portion size. Also, it helps curb late night snacking habits. Some food you usually enjoy may start to taste differently which will help you make healthier food choices.  This medicine will be most effective when combined with a reduced calorie diet and physical activity.  How should I take Qsymia? Take daily in the morning with breakfast. Swallow the extended-release capsule whole. Do not crush, break, or chew it.  If you miss a dose, take it as soon as possible. If it is after 12pm, skip the missed dose and go back to your regular dosing schedule. Do not take extra medicine to make up for the missed dose. You have received two separate prescriptions today. You will initially take a lower dose of 3.75mg/23mg for 14 days then increase to a higher dose of 7.5mg/46mg for maintenance for 30 days. There are 4 total dosing options. Your provider will discuss any need to go up to a higher dose during your office visits.  If you are taking Levothyroxine, take the Levothyroxine 1 hours before breakfast and the take the Qsymia 1 hour after breakfast. Do not stop taking Qsymia without talking to your provider. Stopping Qsymia suddenly can cause serious side effects, such as seizures and headaches.   What should I avoid while taking Qsymia? Limit caffeine to 1 small cup daily. Examples are soda, coffee, tea, herbal tea, energy drinks, and chocolate Avoid decongestant medicines like Sudafed, Mucinex-D, and Zyrtec-D. Qsymia may cause you to feel dizzy, drowsy, or confused, or to have trouble thinking  or speaking. Do not drive or do anything else that could be dangerous until you know how this medicine affects you.  Women who can become pregnant: Use effective birth control (contraception) consistently while taking Qsymia. If you miss a menstrual period, STOP Qsymia and call our office immediately. Pregnancy tests will be performed at your appointment if indicated.  What side effects may I notice when taking Qsymia? Side effects that usually do not require medical attention (report to our office if they continue or are bothersome): Dry mouth (drink at least 64 oz of fluid daily) Constipation (you may take over the counter laxative if needed) Metallic taste in your mouth when drinking carbonated beverages Numbness or tingling in the hands, arms, feet or face that lasts more than a week Headache Sudden changes in vision Mental fuzziness (problems with concentration, attention, memory or speech) Trouble sleeping (insomnia) Side effects that you should report to our office as soon as possible: Increases in heart rate and/or palpitations (feeling like your heart is racing or pounding in your chest that lasts several minutes) Chest pain Increased blood pressure Dizziness or feeling faint Shortness of breath Irritability Feeling anxious, agitated, restless, or nervous Depression or severe changes in mood Problems urinating Unusual swelling of the legs Vomiting   Other important information You will be asked to sign an informed consent prior to starting Qysmia Qsymia is a federally controlled substance. Keep Qsymia in a safe place to prevent misuse and abuse. Selling or giving away Qsymia may harm others, and is against the law.  Your prescription   will be sent to the pharmacy during your visit.  Refills will require an office visit. Your insurance may not cover the cost of this medicine. Our office will complete a pre-authorization if required by your insurance. Steps to starting  your start Contrave  The office will send a prior authorization request to your insurance company for approval. We will send you a mychart message once we hear back from your insurance with a decision.  This can take up to 7-10 business days.   Is Contrave an option for me? Do not take Contrave if you:   Have uncontrolled hypertension  Have or have had seizures  Use other medicines that contain bupropion such as Wellbutrin, Wellbutrin SR, Wellbutrin XL, and Aplenzin.  Have or have had an eating disorder called anorexia (eating very little) or bulimia (eating too much and vomiting to avoid gaining weight)  Are dependent on opioid pain medicines or use medicines to help stop taking opioids such as methadone or buprenorphine, or are in opiate withdrawal  Drink a lot of alcohol and abruptly stop drinking, or use medicines called sedatives (these make you sleepy), benzodiazepines, or anti-seizure medicines and you stop using them all of a sudden  Are taking medicines called monoamine oxidase inhibitors (MAOIs). Ask your healthcare provider or pharmacist if you are not sure if you take an MAOI, including linezoid. Do not start Contrave until you have stopped taking your MAOI for at least 14 days.  Are allergic to naltrexone HCl or bupropion HCl or any of the ingredients in Contrave. See the end of this Medication Guide for a complete list of ingredients in Contrave.  Are pregnant or planning to become pregnant. Tell your healthcare provider right away if you become pregnant while taking CONTRAVE  What is Contrave and how does it work?  Contrave is a prescription only medicine to help with your weight loss. It works on an area of the brain that controls your appetite.  It is a combination of two medicines that are extended release: naltrexone HCL and bupropion HCL.  One of the ingredients in Contrave, bupropion, is the same ingredient in some other medicines used to treat depression and to  help people quit smoking. However, Contrave is not approved to treat depression or other mental illnesses, or to help people quit smoking (smoking cessation).   This medicine will be most effective when combined with a reduced calorie diet and physical activity.  How should I take Contrave?  Take exactly as your provider tells you to. It is an increasing dose according to the following chart:  Contrave How should I take Contrave?   It is best to take Contrave with food. However, do not take with high-fat meals.   Swallow the extended-release tablet whole. Do not crush, break, or chew it.   If you miss a dose, skip the missed dose and go back to your regular dosing schedule. Do not take extra medicine to make up for a missed dose.  Do not take more than 2 tablets in the morning and 2 tablets in the evening.  Do not take more than 2 tablets at the same time or more than 4 tablets in 1 day.  Do not stop taking Contrave without talking to your provider.   Stopping Contrave suddenly can cause serious side effects, such as seizures.  Swallow Contrave tablets whole. Do not cut, chew, or crush tablets.  Do not take Contrave when eating a high-fat meal. It may increase your risk   of seizures.   What should I avoid while taking Contrave?  You should avoid other medicines that contain bupropion such as Wellbutrin, Wellbutrin SR, Wellbutrin XL and Aplenzin.  You should avoid opioid pain medications or medicines to help stop taking opioids such as methadone or buprenorphine. There may be a risk of opioid overdose.  Do not drink alcohol while taking Contrave. If you drink alcohol, talk to your provider before starting Contrave.   Avoid eating a high-fat meal at the same time you take Contrave. It may increase your risk of seizures.   Women who can become pregnant: Use effective birth control (contraception) consistently while taking Contrave. If you miss a menstrual period, STOP Contrave  and call our office immediately. Monthly pregnancy tests will be performed at your appointment if indicated.  What side effects may I notice when taking Contrave?  Side effects that usually do not require medical attention (report to our office if they continue or are bothersome): o Nausea o Constipation (you may take over the counter laxative if needed) o Headache o Dry mouth (drink at least 64 oz. of fluid daily) o Trouble sleeping (insomnia) o Vomiting o Dizziness o Diarrhea   Side effects that you should report to our office as soon as possible: o Seizures: DO NOT take Contrave again o Depression or severe changes in mood o Thoughts about suicide or dying o Feeling anxious, agitated, irritable, restless, or nervous o Slowed breathing and/or shallow breathing o Severe drowsiness o Confusion o Signs of allergic reaction such as rash, itching, hives, fever, swollen lymph glands, painful sores in your mouth or around your eyes, swelling of your lips or tongue, chest pain, or trouble breathing o Increased blood pressure o Increased heart rate or palpitations o Stomach pain lasting more than a few days o Dark urine o Yellowing of the whites of your eyes o Severe tiredness o Sudden changes in vision o Swelling or redness in or around the eye  o Low blood sugar in Type 2 Diabetes.   Other important information  While taking CONTRAVE, you or your family members should pay close attention to any changes, especially sudden changes, in mood, behaviors, thoughts, or feelings and maintain communication with your provider. You will be asked to sign an informed consent prior to starting Contrave.  If another healthcare provider prescribes narcotic pain medication for you, please call our office immediately for advice regarding Contrave.  Your prescription will be sent electronically to your pharmacy.   Refills will require an office visit   

## 2023-05-19 ENCOUNTER — Telehealth (INDEPENDENT_AMBULATORY_CARE_PROVIDER_SITE_OTHER): Admitting: Nurse Practitioner

## 2023-05-19 ENCOUNTER — Encounter: Payer: Self-pay | Admitting: Nurse Practitioner

## 2023-05-19 DIAGNOSIS — E66811 Obesity, class 1: Secondary | ICD-10-CM

## 2023-05-19 DIAGNOSIS — E559 Vitamin D deficiency, unspecified: Secondary | ICD-10-CM | POA: Diagnosis not present

## 2023-05-19 DIAGNOSIS — Z9889 Other specified postprocedural states: Secondary | ICD-10-CM | POA: Diagnosis not present

## 2023-05-19 DIAGNOSIS — S42352A Displaced comminuted fracture of shaft of humerus, left arm, initial encounter for closed fracture: Secondary | ICD-10-CM | POA: Diagnosis not present

## 2023-05-19 DIAGNOSIS — S42352S Displaced comminuted fracture of shaft of humerus, left arm, sequela: Secondary | ICD-10-CM | POA: Diagnosis not present

## 2023-05-19 DIAGNOSIS — M25622 Stiffness of left elbow, not elsewhere classified: Secondary | ICD-10-CM | POA: Diagnosis not present

## 2023-05-19 DIAGNOSIS — E6609 Other obesity due to excess calories: Secondary | ICD-10-CM

## 2023-05-19 DIAGNOSIS — Z789 Other specified health status: Secondary | ICD-10-CM | POA: Diagnosis not present

## 2023-05-19 DIAGNOSIS — Z6832 Body mass index (BMI) 32.0-32.9, adult: Secondary | ICD-10-CM

## 2023-05-19 DIAGNOSIS — E88819 Insulin resistance, unspecified: Secondary | ICD-10-CM

## 2023-05-19 DIAGNOSIS — M25612 Stiffness of left shoulder, not elsewhere classified: Secondary | ICD-10-CM | POA: Diagnosis not present

## 2023-05-19 MED ORDER — METFORMIN HCL 500 MG PO TABS: 500.0000 mg | ORAL_TABLET | Freq: Every day | ORAL | 0 refills | Status: DC

## 2023-05-19 MED ORDER — VITAMIN D (ERGOCALCIFEROL) 1.25 MG (50000 UNIT) PO CAPS: 50000.0000 [IU] | ORAL_CAPSULE | ORAL | 0 refills | Status: DC

## 2023-05-19 NOTE — Progress Notes (Signed)
 Office: [phone]  /  Fax: [phone]  WEIGHT SUMMARY AND BIOMETRICS  TeleHealth Visit:  This visit was completed with telemedicine (audio/video) technology. Susan Boyle has verbally consented to this TeleHealth visit. The patient is located at home, the provider is located at [facility] at [place]. The participants in this visit include the listed provider and patient. The visit was conducted today via MyChart video.  Weight Lost Since Last Visit: 0lb  Weight Gained Since Last Visit: 0lb   Vitals Temp: 98 F (36.7 C) BP: 118/70 Pulse Rate: 89 SpO2: 100 %   Anthropometric Measurements Height: 5\' 3"  (1.6 m) Weight: 185 lb (83.9 kg) BMI (Calculated): 32.78 Weight at Last Visit: 185lb Weight Lost Since Last Visit: 0lb Weight Gained Since Last Visit: 0lb Starting Weight: 180lb Total Weight Loss (lbs): 0 lb (0 kg)   Body Composition  Body Fat %: 43.1 % Fat Mass (lbs): 80 lbs Muscle Mass (lbs): 100 lbs Total Body Water (lbs): 73.6 lbs Visceral Fat Rating : 8   Other Clinical Data Fasting: No Labs: No Today's Visit #: 6 Starting Date: 12/10/22     HPI  Chief Complaint: OBESITY   Interval History:  Susan Boyle is here to discuss her progress with her obesity treatment plan. She is on the the Category 2 Plan and states she is following her eating plan approximately 90 % of the time. She states she is exercising 60 minutes 2-3 days per week.   Interval History:  Since last office visit she has maintained her weight.  She still has limited use of her L arm from L humerus fracture with nerve damage-she is doing OT twice per week. She is not back to work yet.  She is taking Gabapentin 300mg  TID.  She is struggling with polyphagia and cravings.     Pharmacotherapy for weight loss: She is not currently taking medications  for medical weight loss.  She stopped taking compounding Semaglutide Feb 26th due to side effects of blood in her stool. Denies blood in her stools since  stopping Ozempic.    Previous pharmacotherapy for medical weight loss:  None  Bariatric surgery:  Patient has not had bariatric surgery.   Vit D deficiency  She is taking Vit D 50,000 IU weekly.  Denies side effects.  Denies nausea, vomiting or muscle weakness.    Lab Results  Component Value Date   VD25OH 13.4 (L) 12/10/2022     Insulin Resistance Last fasting insulin was 14.1. A1c was 5.1. Polyphagia:Yes Medication(s): None.  She was prescribed Metformin in the past but didn't start taking it.    Lab Results  Component Value Date   HGBA1C 5.1 12/10/2022   HGBA1C 5.2 09/17/2022   Lab Results  Component Value Date   INSULIN 14.1 12/10/2022     PHYSICAL EXAM:  There were no vitals taken for this visit. There is no height or weight on file to calculate BMI.  General: She is overweight, cooperative, alert, well developed, and in no acute distress. PSYCH: Has normal mood, affect and thought process.   Extremities: No edema.  Neurologic: No gross sensory or motor deficits. No tremors or fasciculations noted.    DIAGNOSTIC DATA REVIEWED:  BMET    Component Value Date/Time   NA 138 12/10/2022 0849   K 4.4 12/10/2022 0849   CL 101 12/10/2022 0849   CO2 22 12/10/2022 0849   GLUCOSE 91 12/10/2022 0849   GLUCOSE 101 (H) 03/05/2021 0811   BUN 17 12/10/2022 0849   CREATININE  0.81 12/10/2022 0849   CALCIUM 9.5 12/10/2022 0849   GFRNONAA >60 03/05/2021 0800   GFRAA >90 07/05/2013 2040   Lab Results  Component Value Date   HGBA1C 5.1 12/10/2022   HGBA1C 5.2 09/17/2022   Lab Results  Component Value Date   INSULIN 14.1 12/10/2022   Lab Results  Component Value Date   TSH 2.320 02/04/2023   CBC    Component Value Date/Time   WBC 5.3 12/10/2022 0849   WBC 5.1 03/05/2021 0800   RBC 4.22 12/10/2022 0849   RBC 4.28 03/05/2021 0800   HGB 12.7 12/10/2022 0849   HCT 39.4 12/10/2022 0849   PLT 205 12/10/2022 0849   MCV 93 12/10/2022 0849   MCH 30.1 12/10/2022  0849   MCH 29.9 03/05/2021 0800   MCHC 32.2 12/10/2022 0849   MCHC 33.2 03/05/2021 0800   RDW 12.7 12/10/2022 0849   Iron Studies    Component Value Date/Time   FERRITIN 108 12/10/2022 0849   Lipid Panel     Component Value Date/Time   CHOL 190 12/10/2022 0849   TRIG 57 12/10/2022 0849   HDL 72 12/10/2022 0849   CHOLHDL 2.6 12/10/2022 0849   LDLCALC 107 (H) 12/10/2022 0849   Hepatic Function Panel     Component Value Date/Time   PROT 7.2 12/10/2022 0849   ALBUMIN 4.9 12/10/2022 0849   AST 16 12/10/2022 0849   ALT 18 12/10/2022 0849   ALKPHOS 51 12/10/2022 0849   BILITOT 0.4 12/10/2022 0849      Component Value Date/Time   TSH 2.320 02/04/2023 1033   Nutritional Lab Results  Component Value Date   VD25OH 13.4 (L) 12/10/2022     ASSESSMENT AND PLAN  TREATMENT PLAN FOR OBESITY:  Recommended Dietary Goals  Susan Boyle is currently in the action stage of change. As such, her goal is to continue weight management plan. She has agreed to the Category 2 Plan.  Behavioral Intervention  We discussed the following Behavioral Modification Strategies today: increasing lean protein intake to established goals, decreasing simple carbohydrates , increasing vegetables, increasing fiber rich foods, increasing water intake , reading food labels , keeping healthy foods at home, and continue to work on maintaining a reduced calorie state, getting the recommended amount of protein, incorporating whole foods, making healthy choices, staying well hydrated and practicing mindfulness when eating..  Additional resources provided today: NA  Recommended Physical Activity Goals  Susan Boyle has been advised to work up to 150 minutes of moderate intensity aerobic activity a week and strengthening exercises 2-3 times per week for cardiovascular health, weight loss maintenance and preservation of muscle mass.   She has agreed to Think about enjoyable ways to increase daily physical activity and  overcoming barriers to exercise, Increase physical activity in their day and reduce sedentary time (increase NEAT)., Increase the intensity, frequency or duration of strengthening exercises , and Increase the intensity, frequency or duration of aerobic exercises     Pharmacotherapy We discussed various medication options to help Susan Boyle with her weight loss efforts and we both agreed to start Metformin for IR.  Patient has an IUD.  Side effects discussed.  ASSOCIATED CONDITIONS ADDRESSED TODAY  Action/Plan  Vitamin D deficiency -     Continue Vitamin D (Ergocalciferol); Take 1 capsule (50,000 Units total) by mouth every 7 (seven) days.  Dispense: 5 capsule; Refill: 0  Low Vitamin D level contributes to fatigue and are associated with obesity, breast, and colon cancer. She agrees to continue to  take prescription Vitamin D @50 ,000 IU every week and will follow-up for routine testing of Vitamin D, at least 2-3 times per year to avoid over-replacement.   Insulin resistance -     Start metFORMIN HCl; Take 1 tablet (500 mg total) by mouth daily with breakfast.  Dispense: 30 tablet; Refill: 0. Side effects discussed  Patient has an IUD  Susan Boyle will continue to work on weight loss, exercise, and decreasing simple carbohydrates to help decrease the risk of diabetes. Susan Boyle agreed to follow-up with [place] as directed to closely monitor her progress.   Class 1 obesity due to excess calories with body mass index (BMI) of 32.0 to 32.9 in adult, unspecified whether serious comorbidity present       Will obtain labs at next visit.   Return in about 4 weeks (around 06/16/2023).Marland Kitchen She was informed of the importance of frequent follow up visits to maximize her success with intensive lifestyle modifications for her multiple health conditions.   ATTESTASTION STATEMENTS:  Reviewed by clinician on day of visit: allergies, medications, problem list, medical history, surgical history, family history, social  history, and previous encounter notes.    Theodis Sato. Jasmond River FNP-C

## 2023-05-19 NOTE — Progress Notes (Signed)
 Patient was not seen today due to seeing PCP.  Appt rescheduled for tomorrow

## 2023-05-20 ENCOUNTER — Telehealth: Admitting: Physician Assistant

## 2023-05-20 DIAGNOSIS — F4312 Post-traumatic stress disorder, chronic: Secondary | ICD-10-CM | POA: Diagnosis not present

## 2023-05-20 DIAGNOSIS — B9689 Other specified bacterial agents as the cause of diseases classified elsewhere: Secondary | ICD-10-CM

## 2023-05-20 MED ORDER — AMOXICILLIN-POT CLAVULANATE 875-125 MG PO TABS: 1.0000 | ORAL_TABLET | Freq: Two times a day (BID) | ORAL | 0 refills | Status: DC

## 2023-05-20 NOTE — Progress Notes (Signed)
 I have spent 5 minutes in review of e-visit questionnaire, review and updating patient chart, medical decision making and response to patient.   Piedad Climes, PA-C

## 2023-05-20 NOTE — Progress Notes (Signed)

## 2023-05-20 NOTE — Addendum Note (Signed)
 Addended by: Waldon Merl on: 05/20/2023 02:21 PM   Modules accepted: Orders

## 2023-05-25 DIAGNOSIS — F4312 Post-traumatic stress disorder, chronic: Secondary | ICD-10-CM | POA: Diagnosis not present

## 2023-05-26 DIAGNOSIS — S42352D Displaced comminuted fracture of shaft of humerus, left arm, subsequent encounter for fracture with routine healing: Secondary | ICD-10-CM | POA: Diagnosis not present

## 2023-05-26 DIAGNOSIS — G5632 Lesion of radial nerve, left upper limb: Secondary | ICD-10-CM | POA: Diagnosis not present

## 2023-05-27 ENCOUNTER — Other Ambulatory Visit (HOSPITAL_COMMUNITY): Payer: Self-pay

## 2023-05-27 ENCOUNTER — Encounter: Payer: Self-pay | Admitting: Family Medicine

## 2023-05-27 DIAGNOSIS — F4312 Post-traumatic stress disorder, chronic: Secondary | ICD-10-CM | POA: Diagnosis not present

## 2023-05-27 MED ORDER — GABAPENTIN 100 MG PO CAPS
100.0000 mg | ORAL_CAPSULE | Freq: Three times a day (TID) | ORAL | 11 refills | Status: DC
  Filled 2023-05-27: qty 90, 30d supply, fill #0
  Filled 2023-06-22 – 2023-07-04 (×2): qty 90, 30d supply, fill #1

## 2023-05-27 MED ORDER — AMPHETAMINE-DEXTROAMPHETAMINE 20 MG PO TABS
20.0000 mg | ORAL_TABLET | Freq: Three times a day (TID) | ORAL | 0 refills | Status: DC
  Filled 2023-06-01 (×2): qty 90, 30d supply, fill #0

## 2023-05-28 ENCOUNTER — Other Ambulatory Visit: Payer: Self-pay

## 2023-05-28 MED ORDER — CEPHALEXIN 500 MG PO CAPS: 500.0000 mg | ORAL_CAPSULE | Freq: Two times a day (BID) | ORAL | 0 refills | Status: AC

## 2023-05-31 ENCOUNTER — Encounter (HOSPITAL_COMMUNITY): Payer: Self-pay

## 2023-05-31 ENCOUNTER — Other Ambulatory Visit (HOSPITAL_COMMUNITY): Payer: Self-pay

## 2023-06-01 ENCOUNTER — Other Ambulatory Visit (HOSPITAL_COMMUNITY): Payer: Self-pay

## 2023-06-03 DIAGNOSIS — S42352S Displaced comminuted fracture of shaft of humerus, left arm, sequela: Secondary | ICD-10-CM | POA: Diagnosis not present

## 2023-06-03 DIAGNOSIS — M25612 Stiffness of left shoulder, not elsewhere classified: Secondary | ICD-10-CM | POA: Diagnosis not present

## 2023-06-03 DIAGNOSIS — Z9889 Other specified postprocedural states: Secondary | ICD-10-CM | POA: Diagnosis not present

## 2023-06-03 DIAGNOSIS — M25622 Stiffness of left elbow, not elsewhere classified: Secondary | ICD-10-CM | POA: Diagnosis not present

## 2023-06-03 DIAGNOSIS — S42352A Displaced comminuted fracture of shaft of humerus, left arm, initial encounter for closed fracture: Secondary | ICD-10-CM | POA: Diagnosis not present

## 2023-06-03 DIAGNOSIS — Z789 Other specified health status: Secondary | ICD-10-CM | POA: Diagnosis not present

## 2023-06-06 ENCOUNTER — Ambulatory Visit: Admission: EM | Admit: 2023-06-06 | Discharge: 2023-06-06 | Disposition: A | Discharge status: Home / Self Care

## 2023-06-06 DIAGNOSIS — S90455A Superficial foreign body, left lesser toe(s), initial encounter: Secondary | ICD-10-CM

## 2023-06-06 DIAGNOSIS — F411 Generalized anxiety disorder: Secondary | ICD-10-CM | POA: Diagnosis not present

## 2023-06-06 HISTORY — DX: Other injury of unspecified body region, initial encounter: T14.8XXA

## 2023-06-06 HISTORY — DX: Lesion of radial nerve, left upper limb: G56.32

## 2023-06-06 NOTE — Discharge Instructions (Signed)
 At this time, the splinter in your left toe appears to be broken down and soft enough that you should be able to remove it with a pumice stone after soaking your foot in warm water with Epsom salt for 20 to 30 minutes.  If you see any signs of infection which include swelling, redness, purulent drainage or foul odor, please return for repeat evaluation.  At this time, none of these are present and antibiotics are not needed.  Thank you for visiting [facility] today.

## 2023-06-06 NOTE — ED Triage Notes (Signed)
 Patient states she has a splinter in her left big toe. She is unsure of how she got the splinter, but potentially from her wooden porch.   Start date: Three weeks ago at least   Home Interventions: Soaked foot in epsom salt

## 2023-06-06 NOTE — ED Provider Notes (Signed)
 [facility]    CSN: [id number] Arrival date & time: 06/06/23  1136    HISTORY   Chief Complaint  Patient presents with   Foreign Body in Skin   HPI Susan Boyle is a pleasant, 33 y.o. female who presents to urgent care today. Patient states she got a splinter in her left big toe 3 weeks ago.  States she has been soaking her foot in Epsom salt.  Denies pain, swelling, erythema, drainage from site of entry.  Patient states she and her partner have attempted to remove the splinter without success.  Patient is requesting splinter removal at this time because it is bothering her mentally.  The history is provided by the patient.   Past Medical History:  Diagnosis Date   Allergy    Seasonal. Taking allergy injections   Anxiety    Asthma    Back pain, chronic    Comminuted fracture    Left arm   Depression    Depression screen 12/10/2022   Elevated cholesterol with high triglycerides 11/26/2022   Gastroesophageal reflux disease 12/10/2022   Heartburn    Joint pain    Knee pain, chronic    Radial nerve palsy, left    Shortness of breath    Patient Active Problem List   Diagnosis Date Noted   Radial nerve palsy, left 04/02/2023   Decreased activities of daily living (ADL) 03/25/2023   Insulin resistance 03/25/2023   Decreased range of motion of left elbow 02/26/2023   Decreased range of motion of left shoulder 02/26/2023   Left supracondylar humerus fracture, closed, initial encounter 02/19/2023   Closed displaced comminuted fracture of shaft of left humerus 02/18/2023   Tobacco abuse 02/18/2023   Other Specified Feeding or Eating Disorder, Emotional Eating Behaviors 01/26/2023   Vitamin D deficiency 12/30/2022   Other fatigue 12/10/2022   SOBOE (shortness of breath on exertion) 12/10/2022   Gastroesophageal reflux disease 12/10/2022   Depression screen 12/10/2022   BMI 31.0-31.9,adult 12/10/2022   Generalized obesity 12/10/2022   Anxiety and depression  11/26/2022   Class 1 obesity due to excess calories with body mass index (BMI) of 31.0 to 31.9 in adult 11/26/2022   Elevated cholesterol with high triglycerides 11/26/2022   Attention deficit hyperactivity disorder (ADHD) 09/17/2022   IUD contraception 09/17/2022   Seasonal and perennial allergic rhinoconjunctivitis 11/05/2020   Allergic reaction 07/04/2020   Food allergy 05/02/2020   Environmental allergies 05/02/2020   Asthma    Past Surgical History:  Procedure Laterality Date   HERNIA REPAIR     inguinal hernia repair as infant   NERVE, TENDON AND ARTERY REPAIR Left 07/20/2013   Procedure: NERVE,  REPAIR;  Surgeon: Kemp Patter, MD;  Location: [facility];  Service: Orthopedics;  Laterality: Left;   thumb surgery Left    Nerve repair   OB History     Gravida  0   Para  0   Term  0   Preterm  0   AB  0   Living  0      SAB  0   IAB  0   Ectopic  0   Multiple  0   Live Births             Home Medications    Prior to Admission medications   Medication Sig Start Date End Date Taking? Authorizing Provider  albuterol (PROVENTIL) (2.5 MG/3ML) 0.083% nebulizer solution Take 3 mLs (2.5 mg total) by nebulization  every 4 (four) hours as needed for wheezing or shortness of breath (coughing fits). 01/21/23   Trudy Fusi, DO  albuterol (VENTOLIN HFA) 108 (90 Base) MCG/ACT inhaler Inhale 2 puffs into the lungs every 4 (four) hours as needed for wheezing or shortness of breath (coughing fits). 01/21/23   Trudy Fusi, DO  amphetamine-dextroamphetamine (ADDERALL) 20 MG tablet Take 1 tablet (20 mg total) by mouth 3 (three) times daily in the morning, at 12 (noon), and 4 (four) pm. 04/30/23     amphetamine-dextroamphetamine (ADDERALL) 20 MG tablet Take 1 tablet (20 mg total) by mouth 3 (three) times daily in the morning, at 12 (noon), and 4 (four) pm. (05/31/23) 05/31/23     brompheniramine-pseudoephedrine-DM 30-2-10 MG/5ML syrup Take 5 mLs by mouth 4 (four) times  daily as needed. 05/18/23   Angelia Kelp, PA-C  buPROPion (WELLBUTRIN XL) 150 MG 24 hr tablet Take 1 tablet (150 mg total) by mouth at bedtime. 10/01/22     cromolyn (OPTICROM) 4 % ophthalmic solution Place 1 drop into both eyes 4 (four) times daily as needed (itchy/watery eyes). 01/21/23   Trudy Fusi, DO  EPINEPHrine (EPIPEN 2-PAK) 0.3 mg/0.3 mL IJ SOAJ injection Inject 0.3 mg into the muscle as needed for anaphylaxis. 12/31/22   Trudy Fusi, DO  famotidine (PEPCID) 40 MG tablet Take 1 tablet (40 mg total) by mouth daily. 03/25/23   Bowen, Leeta Puls, DO  fluticasone (FLONASE) 50 MCG/ACT nasal spray Place 1-2 sprays into both nostrils daily as needed (nasal congestion). 01/21/23   Trudy Fusi, DO  fluticasone-salmeterol (ADVAIR) 250-50 MCG/ACT AEPB Inhale 1 puff into the lungs in the morning and at bedtime. 01/26/23   Trudy Fusi, DO  gabapentin (NEURONTIN) 100 MG capsule Take 1 capsule (100 mg total) by mouth 3 (three) times a day. Take with 400 mg capsules per neurontin protocol (handout given) 05/27/23     gabapentin (NEURONTIN) 300 MG capsule Take 1 capsule (300 mg total) by mouth 3 (three) times daily. 03/12/23     hydrOXYzine (ATARAX) 10 MG tablet Take 1 tablet (10 mg total) by mouth 3 (three) times daily as needed for itching. 02/24/23   Rucker, Alethea Y, MD  levonorgestrel (MIRENA) 20 MCG/24HR IUD 1 each by Intrauterine route once.    [provider]  metFORMIN (GLUCOPHAGE) 500 MG tablet Take 1 tablet (500 mg total) by mouth daily with breakfast. 05/19/23   Helane Lloyd, FNP  nystatin-triamcinolone ointment (MYCOLOG) Apply 1 Application topically 2 (two) times daily. 03/04/23   Manette Section, MD  PARoxetine (PAXIL) 20 MG tablet Take 1 tablet (20 mg total) by mouth at bedtime. 10/01/22     Vitamin D, Ergocalciferol, (DRISDOL) 1.25 MG (50000 UNIT) CAPS capsule Take 1 capsule (50,000 Units total) by mouth every 7 (seven) days. 05/19/23   Helane Lloyd, FNP    Family  History Family History  Adopted: Yes  Problem Relation Age of Onset   Hyperlipidemia Father    Hypertension Father    Heart disease Father 54       MI   Obesity Father    Vision loss Maternal Grandmother    Cancer Maternal Grandfather    Vision loss Maternal Uncle    Social History Social History   Tobacco Use   Smoking status: Former    Current packs/day: 0.00    Average packs/day: 0.3 packs/day for 10.0 years (2.5 ttl pk-yrs)    Types: Cigarettes, E-cigarettes    Quit date: 01/25/2019  Years since quitting: 4.3   Smokeless tobacco: Never   Tobacco comments:    stopped in November 2021   Vaping Use   Vaping status: Former   Substances: Nicotine   Devices: Current: Nicotine patch  Substance Use Topics   Alcohol use: Yes    Alcohol/week: 4.0 standard drinks of alcohol    Types: 4 Glasses of wine per week    Comment: social   Drug use: Not Currently    Types: Marijuana   Allergies   Other and Sulfamethoxazole  Review of Systems Review of Systems Pertinent findings revealed after performing a 14 point review of systems has been noted in the history of present illness.  Physical Exam Vital Signs BP 104/66 (BP Location: Right Arm)   Pulse 86   Temp 98.4 F (36.9 C) (Oral)   Resp 18   LMP  (LMP Unknown)   SpO2 95%   No data found.  Physical Exam Vitals and nursing note reviewed.  Constitutional:      General: She is not in acute distress.    Appearance: Normal appearance.  HENT:     Head: Normocephalic and atraumatic.  Eyes:     Pupils: Pupils are equal, round, and reactive to light.  Cardiovascular:     Rate and Rhythm: Normal rate and regular rhythm.  Pulmonary:     Effort: Pulmonary effort is normal.     Breath sounds: Normal breath sounds.  Musculoskeletal:        General: Normal range of motion.     Cervical back: Normal range of motion and neck supple.       Feet:  Skin:    General: Skin is warm and dry.  Neurological:     General: No  focal deficit present.     Mental Status: She is alert and oriented to person, place, and time. Mental status is at baseline.  Psychiatric:        Mood and Affect: Mood normal.        Behavior: Behavior normal.        Thought Content: Thought content normal.        Judgment: Judgment normal.     Visual Acuity Right Eye Distance:   Left Eye Distance:   Bilateral Distance:    Right Eye Near:   Left Eye Near:    Bilateral Near:     UC Couse / Diagnostics / Procedures:     Radiology No results found.  Procedures Procedures (including critical care time) EKG  Pending results:  Labs Reviewed - No data to display  Medications Ordered in UC: Medications - No data to display  UC Diagnoses / Final Clinical Impressions(s)   I have reviewed the triage vital signs and the nursing notes.  Pertinent labs & imaging results that were available during my care of the patient were reviewed by me and considered in my medical decision making (see chart for details).    Final diagnoses:  Splinter of toe of left foot, initial encounter   Patient advised that I suspect the location of the foreign body, possibly a splinter, is hindering her ability to remove the object herself.  Given the lack of surrounding erythema, pain, swelling or any other evidence of infection, patient advised that the risks of treating an incision to remove the object do not outweigh any possible benefit.  Patient advised to continue soaking her foot in warm water with Epsom salt and to use a pumice stone to gently slough away  the object.  Patient given return precautions.  Please see discharge instructions below for details of plan of care as provided to patient. ED Prescriptions   None    PDMP not reviewed this encounter.  Pending results:  Labs Reviewed - No data to display    Discharge Instructions      At this time, the splinter in your left toe appears to be broken down and soft enough that you should  be able to remove it with a pumice stone after soaking your foot in warm water with Epsom salt for 20 to 30 minutes.  If you see any signs of infection which include swelling, redness, purulent drainage or foul odor, please return for repeat evaluation.  At this time, none of these are present and antibiotics are not needed.  Thank you for visiting [facility] today.      Disposition Upon Discharge:  Condition: stable for discharge home  Patient presented with an acute illness with associated systemic symptoms and significant discomfort requiring urgent management. In my opinion, this is a condition that a prudent lay person (someone who possesses an average knowledge of health and medicine) may potentially expect to result in complications if not addressed urgently such as respiratory distress, impairment of bodily function or dysfunction of bodily organs.   Routine symptom specific, illness specific and/or disease specific instructions were discussed with the patient and/or caregiver at length.   As such, the patient has been evaluated and assessed, work-up was performed and treatment was provided in alignment with urgent care protocols and evidence based medicine.  Patient/parent/caregiver has been advised that the patient may require follow up for further testing and treatment if the symptoms continue in spite of treatment, as clinically indicated and appropriate.  Patient/parent/caregiver has been advised to return to the [facility] or PCP if no better; to PCP or the Emergency Department if new signs and symptoms develop, or if the current signs or symptoms continue to change or worsen for further workup, evaluation and treatment as clinically indicated and appropriate  The patient will follow up with their current PCP if and as advised. If the patient does not currently have a PCP we will assist them in obtaining one.   The patient may need specialty follow up if the symptoms  continue, in spite of conservative treatment and management, for further workup, evaluation, consultation and treatment as clinically indicated and appropriate.  Patient/parent/caregiver verbalized understanding and agreement of plan as discussed.  All questions were addressed during visit.  Please see discharge instructions below for further details of plan.  This office note has been dictated using Teaching laboratory technician.  Unfortunately, this method of dictation can sometimes lead to typographical or grammatical errors.  I apologize for your inconvenience in advance if this occurs.  Please do not hesitate to reach out to me if clarification is needed.      Eloise Hake Scales, PA-C 06/06/23 1240

## 2023-06-09 DIAGNOSIS — F411 Generalized anxiety disorder: Secondary | ICD-10-CM | POA: Diagnosis not present

## 2023-06-10 DIAGNOSIS — F4312 Post-traumatic stress disorder, chronic: Secondary | ICD-10-CM | POA: Diagnosis not present

## 2023-06-15 DIAGNOSIS — F4312 Post-traumatic stress disorder, chronic: Secondary | ICD-10-CM | POA: Diagnosis not present

## 2023-06-16 DIAGNOSIS — F411 Generalized anxiety disorder: Secondary | ICD-10-CM | POA: Diagnosis not present

## 2023-06-18 ENCOUNTER — Ambulatory Visit: Admitting: Family Medicine

## 2023-06-18 ENCOUNTER — Encounter: Payer: Self-pay | Admitting: Family Medicine

## 2023-06-18 VITALS — BP 103/66 | HR 85 | Temp 98.4°F | Ht 63.0 in | Wt 186.0 lb

## 2023-06-18 DIAGNOSIS — R12 Heartburn: Secondary | ICD-10-CM

## 2023-06-18 DIAGNOSIS — E6609 Other obesity due to excess calories: Secondary | ICD-10-CM

## 2023-06-18 DIAGNOSIS — M792 Neuralgia and neuritis, unspecified: Secondary | ICD-10-CM | POA: Diagnosis not present

## 2023-06-18 DIAGNOSIS — E559 Vitamin D deficiency, unspecified: Secondary | ICD-10-CM | POA: Diagnosis not present

## 2023-06-18 DIAGNOSIS — E66811 Obesity, class 1: Secondary | ICD-10-CM

## 2023-06-18 DIAGNOSIS — Z6832 Body mass index (BMI) 32.0-32.9, adult: Secondary | ICD-10-CM

## 2023-06-18 MED ORDER — PANTOPRAZOLE SODIUM 40 MG PO TBEC: 40.0000 mg | DELAYED_RELEASE_TABLET | Freq: Every day | ORAL | 1 refills | Status: DC

## 2023-06-18 NOTE — Progress Notes (Signed)
 Office: [phone]  /  Fax: [phone]  WEIGHT SUMMARY AND BIOMETRICS  Starting Date: 12/10/22  Starting Weight: 180lb   Weight Lost Since Last Visit: 0   Vitals Temp: 98.4 F (36.9 C) BP: 103/66 Pulse Rate: 85 SpO2: 100 %   Body Composition  Body Fat %: 42.8 % Fat Mass (lbs): 79.6 lbs Muscle Mass (lbs): 101 lbs Total Body Water (lbs): 74.2 lbs Visceral Fat Rating : 8     HPI  Chief Complaint: OBESITY  Susan Boyle is here to discuss her progress with her obesity treatment plan. She is on the the Category 1 Plan and states she is following her eating plan approximately 50 % of the time. She states she is exercising walking 60 minutes 3 times per week.   Interval History:  Since last office visit she is 1 lb She has a net weight gain of 6 lb in 6 mos She has continued to struggle with neuropathy and poor ROM since L humerus fracture She has an upcoming nerve conduction study She has been frustrated mentally and has financial strain She has been learning to [employer] with one hand She is eating more food especially mangos She remains out of work and is trying not to graze all day She has had diarrhea since starting metformin  Exercise has been limited due to L arm injurty  Pharmacotherapy: metformin  500 mg daily  PHYSICAL EXAM:  Blood pressure 103/66, pulse 85, temperature 98.4 F (36.9 C), height 5\' 3"  (1.6 m), weight 186 lb (84.4 kg), SpO2 100%. Body mass index is 32.95 kg/m.  General: She is overweight, cooperative, alert, well developed, and in no acute distress. PSYCH: Has normal mood, affect and thought process.   Lungs: Normal breathing effort, no conversational dyspnea.   ASSESSMENT AND PLAN  TREATMENT PLAN FOR OBESITY:  Recommended Dietary Goals  Kennah is currently in the action stage of change. As such, her goal is to continue weight management plan. She has agreed to keeping a food journal and adhering to recommended goals of 1500 calories and  90 g of  protein and practicing portion control and making smarter food choices, such as increasing vegetables and decreasing simple carbohydrates.  Behavioral Intervention  We discussed the following Behavioral Modification Strategies today: increasing lean protein intake to established goals, increasing fiber rich foods, increasing water intake , work on meal planning and preparation, work on Counselling psychologist calories using tracking application, keeping healthy foods at home, practice mindfulness eating and understand the difference between hunger signals and cravings, avoiding temptations and identifying enticing environmental cues, continue to practice mindfulness when eating, and continue to work on maintaining a reduced calorie state, getting the recommended amount of protein, incorporating whole foods, making healthy choices, staying well hydrated and practicing mindfulness when eating..  Additional resources provided today: NA  Recommended Physical Activity Goals  Ladoris has been advised to work up to 150 minutes of moderate intensity aerobic activity a week and strengthening exercises 2-3 times per week for cardiovascular health, weight loss maintenance and preservation of muscle mass.   She has agreed to Think about enjoyable ways to increase daily physical activity and overcoming barriers to exercise and Increase physical activity in their day and reduce sedentary time (increase NEAT). Look into a trainer thru [facility] that deals with injuries  Pharmacotherapy changes for the treatment of obesity: d/c metformin   ASSOCIATED CONDITIONS ADDRESSED TODAY  Vitamin D  deficiency Last vitamin D  Lab Results  Component Value Date   VD25OH 13.4 (  L) 12/10/2022  She has been taking vitamin D  50,000 international units weekly She is overdue for her lab, ordered D/c RX vitamin D   -     VITAMIN D  25 Hydroxy (Vit-D Deficiency, Fractures)  Heartburn She has been having breakthrough  acid reflux using famotidine  40 mg once daily.  She has triggered by high acid foods but has been avoiding late-night eating and large food volumes.  She also seems to be triggered by bananas and coffee.  Her sister has acid reflux as well and has done well on pantoprazole .  Continue to work on lifestyle changes.  Discontinue famotidine  and begin pantoprazole  40 mg once daily. -     Pantoprazole  Sodium; Take 1 tablet (40 mg total) by mouth daily.  Dispense: 30 tablet; Refill: 1  Class 1 obesity due to excess calories with body mass index (BMI) of 32.0 to 32.9 in adult, unspecified whether serious comorbidity present  Neuropathic pain Worsening.  She reports left arm neuropathic pain on gabapentin .  Her dose of gabapentin  continues to increase which has only made her hungrier.  She also has pain related hunger but has been working on mindfulness and healthy food choices.  She is scheduled for additional testing with neurology.  Use caution with cooking and exercise.  She was informed of the importance of frequent follow up visits to maximize her success with intensive lifestyle modifications for her multiple health conditions.   ATTESTASTION STATEMENTS:  Reviewed by clinician on day of visit: allergies, medications, problem list, medical history, surgical history, family history, social history, and previous encounter notes pertinent to obesity diagnosis.   I have personally spent 32 minutes total time today in preparation, patient care, nutritional counseling and education,  and documentation for this visit, including the following: review of most recent clinical lab tests, prescribing medications/ refilling medications, reviewing medical assistant documentation, review and interpretation of bioimpedence results.     Micky Albee, D.O. DABFM, DABOM [facility] [address] [place], [place] [address] [phone]

## 2023-06-22 ENCOUNTER — Other Ambulatory Visit: Payer: Self-pay

## 2023-06-23 ENCOUNTER — Ambulatory Visit (INDEPENDENT_AMBULATORY_CARE_PROVIDER_SITE_OTHER): Admitting: Family Medicine

## 2023-06-23 VITALS — BP 118/75 | HR 83 | Temp 98.0°F | Resp 18 | Ht 63.0 in | Wt 190.0 lb

## 2023-06-23 DIAGNOSIS — Z1329 Encounter for screening for other suspected endocrine disorder: Secondary | ICD-10-CM | POA: Diagnosis not present

## 2023-06-23 DIAGNOSIS — Z136 Encounter for screening for cardiovascular disorders: Secondary | ICD-10-CM

## 2023-06-23 DIAGNOSIS — Z1321 Encounter for screening for nutritional disorder: Secondary | ICD-10-CM

## 2023-06-23 DIAGNOSIS — Z0000 Encounter for general adult medical examination without abnormal findings: Secondary | ICD-10-CM

## 2023-06-23 DIAGNOSIS — R7302 Impaired glucose tolerance (oral): Secondary | ICD-10-CM | POA: Diagnosis not present

## 2023-06-23 DIAGNOSIS — Z23 Encounter for immunization: Secondary | ICD-10-CM | POA: Diagnosis not present

## 2023-06-23 DIAGNOSIS — R29898 Other symptoms and signs involving the musculoskeletal system: Secondary | ICD-10-CM

## 2023-06-23 DIAGNOSIS — E559 Vitamin D deficiency, unspecified: Secondary | ICD-10-CM

## 2023-06-23 DIAGNOSIS — Z13220 Encounter for screening for lipoid disorders: Secondary | ICD-10-CM

## 2023-06-23 NOTE — Progress Notes (Signed)
 Complete physical exam  Patient: Susan Boyle   DOB: 18-May-1990   33 y.o. Female  MRN: [id number]  Subjective:    Chief Complaint  Patient presents with   Annual Exam    Patient is here for her annual physical, patient is not fasting.    Susan Boyle is a 33 y.o. female who presents today for a complete physical exam. She reports consuming a  high protein and low carb  diet.  Walking 2x a week.   She generally feels well. She reports sleeping fairly well. She does not have additional problems to discuss today.  Pt would like her Vitamin D  and thyroid  checked with her CPE labs. She understands it may not be covered with CPE  Most recent fall risk assessment:    09/17/2022    2:23 PM  Fall Risk   Falls in the past year? 0  Number falls in past yr: 0  Injury with Fall? 0  Risk for fall due to : No Fall Risks  Follow up Falls evaluation completed     Most recent depression screenings:    09/17/2022    2:23 PM 05/02/2020    3:25 PM  PHQ 2/9 Scores  PHQ - 2 Score 3 0  PHQ- 9 Score 14     Vision:Within last year  Patient Active Problem List   Diagnosis Date Noted   Neuropathic pain 06/18/2023   Radial nerve palsy, left 04/02/2023   Decreased activities of daily living (ADL) 03/25/2023   Insulin  resistance 03/25/2023   Decreased range of motion of left elbow 02/26/2023   Decreased range of motion of left shoulder 02/26/2023   Left supracondylar humerus fracture, closed, initial encounter 02/19/2023   Closed displaced comminuted fracture of shaft of left humerus 02/18/2023   Tobacco abuse 02/18/2023   Other Specified Feeding or Eating Disorder, Emotional Eating Behaviors 01/26/2023   Vitamin D  deficiency 12/30/2022   Other fatigue 12/10/2022   SOBOE (shortness of breath on exertion) 12/10/2022   Gastroesophageal reflux disease 12/10/2022   Depression screen 12/10/2022   BMI 31.0-31.9,adult 12/10/2022   Generalized obesity 12/10/2022   Anxiety and depression  11/26/2022   Class 1 obesity due to excess calories with body mass index (BMI) of 31.0 to 31.9 in adult 11/26/2022   Elevated cholesterol with high triglycerides 11/26/2022   Attention deficit hyperactivity disorder (ADHD) 09/17/2022   IUD contraception 09/17/2022   Seasonal and perennial allergic rhinoconjunctivitis 11/05/2020   Allergic reaction 07/04/2020   Food allergy 05/02/2020   Environmental allergies 05/02/2020   Asthma    Past Medical History:  Diagnosis Date   Allergy    Seasonal. Taking allergy injections   Anxiety    Asthma    Back pain, chronic    Comminuted fracture    Left arm   Depression    Depression screen 12/10/2022   Elevated cholesterol with high triglycerides 11/26/2022   Gastroesophageal reflux disease 12/10/2022   Heartburn    Joint pain    Knee pain, chronic    Radial nerve palsy, left    Shortness of breath    Past Surgical History:  Procedure Laterality Date   HERNIA REPAIR     inguinal hernia repair as infant   NERVE, TENDON AND ARTERY REPAIR Left 07/20/2013   Procedure: NERVE,  REPAIR;  Surgeon: Kemp Patter, MD;  Location: [facility];  Service: Orthopedics;  Laterality: Left;   thumb surgery Left    Nerve repair  Social History   Socioeconomic History   Marital status: Married    Spouse name: Not on file   Number of children: 0   Years of education: Not on file   Highest education level: Some college, no degree  Occupational History   Not on file  Tobacco Use   Smoking status: Former    Current packs/day: 0.00    Average packs/day: 0.3 packs/day for 10.0 years (2.5 ttl pk-yrs)    Types: Cigarettes, E-cigarettes    Quit date: 01/25/2019    Years since quitting: 4.4   Smokeless tobacco: Never   Tobacco comments:    stopped in November 2021   Vaping Use   Vaping status: Former   Substances: Nicotine   Devices: Current: Nicotine patch  Substance and Sexual Activity   Alcohol use: Yes    Alcohol/week: 4.0  standard drinks of alcohol    Types: 4 Glasses of wine per week    Comment: social   Drug use: Not Currently    Types: Marijuana   Sexual activity: Yes    Birth control/protection: I.U.D.  Other Topics Concern   Not on file  Social History Narrative   Not on file   Social Drivers of Health   Financial Resource Strain: High Risk (06/23/2023)   Overall Financial Resource Strain (CARDIA)    Difficulty of Paying Living Expenses: Very hard  Food Insecurity: Food Insecurity Present (06/23/2023)   Hunger Vital Sign    Worried About Running Out of Food in the Last Year: Often true    Ran Out of Food in the Last Year: Sometimes true  Transportation Needs: Unmet Transportation Needs (06/23/2023)   PRAPARE - Administrator, Civil Service (Medical): Yes    Lack of Transportation (Non-Medical): Yes  Physical Activity: Insufficiently Active (06/23/2023)   Exercise Vital Sign    Days of Exercise per Week: 2 days    Minutes of Exercise per Session: 60 min  Stress: Stress Concern Present (06/23/2023)   [employer] of Occupational Health - Occupational Stress Questionnaire    Feeling of Stress : To some extent  Social Connections: Socially Integrated (06/23/2023)   Social Connection and Isolation Panel [NHANES]    Frequency of Communication with Friends and Family: More than three times a week    Frequency of Social Gatherings with Friends and Family: Once a week    Attends Religious Services: 1 to 4 times per year    Active Member of [employer] or Organizations: Yes    Attends Engineer, structural: More than 4 times per year    Marital Status: Living with partner  Intimate Partner Violence: Unknown (05/27/2021)   Received from [employer], [facility]   HITS    Physically Hurt: Not on file    Insult or Talk Down To: Not on file    Threaten Physical Harm: Not on file    Scream or Curse: Not on file   Family History  Adopted: Yes  Problem Relation Age of Onset    Hyperlipidemia Father    Hypertension Father    Heart disease Father 22       MI   Obesity Father    Vision loss Maternal Grandmother    Cancer Maternal Grandfather    Vision loss Maternal Uncle    Allergies  Allergen Reactions   Other Anaphylaxis    Campbell's Clam/Corn Chowder    Sulfamethoxazole Nausea And Vomiting    Patient Care Team: Manette Section, MD  as PCP - General (Family Medicine) Gean Keels, MD as Consulting Physician (Sports Medicine)   Outpatient Medications Prior to Visit  Medication Sig   albuterol  (VENTOLIN  HFA) 108 (90 Base) MCG/ACT inhaler Inhale 2 puffs into the lungs every 4 (four) hours as needed for wheezing or shortness of breath (coughing fits).   amphetamine -dextroamphetamine  (ADDERALL) 20 MG tablet Take 1 tablet (20 mg total) by mouth 3 (three) times daily in the morning, at 12 (noon), and 4 (four) pm.   buPROPion  (WELLBUTRIN  XL) 150 MG 24 hr tablet Take 1 tablet (150 mg total) by mouth at bedtime.   EPINEPHrine  (EPIPEN  2-PAK) 0.3 mg/0.3 mL IJ SOAJ injection Inject 0.3 mg into the muscle as needed for anaphylaxis.   fluticasone  (FLONASE ) 50 MCG/ACT nasal spray Place 1-2 sprays into both nostrils daily as needed (nasal congestion).   fluticasone -salmeterol (ADVAIR ) 250-50 MCG/ACT AEPB Inhale 1 puff into the lungs in the morning and at bedtime.   gabapentin  (NEURONTIN ) 100 MG capsule Take 1 capsule (100 mg total) by mouth 3 (three) times a day. Take with 400 mg capsules per neurontin  protocol (handout given)   gabapentin  (NEURONTIN ) 300 MG capsule Take 1 capsule (300 mg total) by mouth 3 (three) times daily. (Patient taking differently: Take 300 mg by mouth 3 (three) times daily. Taking 800MG  3 times daily)   levonorgestrel (MIRENA) 20 MCG/24HR IUD 1 each by Intrauterine route once.   pantoprazole  (PROTONIX ) 40 MG tablet Take 1 tablet (40 mg total) by mouth daily.   PARoxetine  (PAXIL ) 20 MG tablet Take 1 tablet (20 mg total) by mouth at  bedtime.   No facility-administered medications prior to visit.    Review of Systems  All other systems reviewed and are negative.         Objective:     BP 118/75   Pulse 83   Temp 98 F (36.7 C) (Oral)   Resp 18   Ht 5\' 3"  (1.6 m)   Wt 190 lb (86.2 kg)   SpO2 99%   BMI 33.66 kg/m  BP Readings from Last 3 Encounters:  06/23/23 118/75  06/18/23 103/66  06/06/23 104/66      Physical Exam Vitals and nursing note reviewed.  Constitutional:      Appearance: Normal appearance. She is normal weight.  HENT:     Head: Normocephalic and atraumatic.     Right Ear: Tympanic membrane, ear canal and external ear normal.     Left Ear: Tympanic membrane, ear canal and external ear normal.     Nose: Nose normal.     Mouth/Throat:     Mouth: Mucous membranes are moist.     Pharynx: Oropharynx is clear.  Eyes:     Conjunctiva/sclera: Conjunctivae normal.     Pupils: Pupils are equal, round, and reactive to light.  Cardiovascular:     Rate and Rhythm: Normal rate and regular rhythm.     Pulses: Normal pulses.     Heart sounds: Normal heart sounds.  Pulmonary:     Effort: Pulmonary effort is normal.     Breath sounds: Normal breath sounds.  Abdominal:     General: Abdomen is flat. Bowel sounds are normal.  Skin:    General: Skin is warm.     Capillary Refill: Capillary refill takes less than 2 seconds.  Neurological:     General: No focal deficit present.     Mental Status: She is alert and oriented to person, place, and time. Mental status is at  baseline.  Psychiatric:        Mood and Affect: Mood normal.        Behavior: Behavior normal.        Thought Content: Thought content normal.        Judgment: Judgment normal.     No results found for any visits on 06/23/23. Last CBC Lab Results  Component Value Date   WBC 5.3 12/10/2022   HGB 12.7 12/10/2022   HCT 39.4 12/10/2022   MCV 93 12/10/2022   MCH 30.1 12/10/2022   RDW 12.7 12/10/2022   PLT 205 12/10/2022    Last metabolic panel Lab Results  Component Value Date   GLUCOSE 91 12/10/2022   NA 138 12/10/2022   K 4.4 12/10/2022   CL 101 12/10/2022   CO2 22 12/10/2022   BUN 17 12/10/2022   CREATININE 0.81 12/10/2022   EGFR 99 12/10/2022   CALCIUM 9.5 12/10/2022   PROT 7.2 12/10/2022   ALBUMIN 4.9 12/10/2022   LABGLOB 2.3 12/10/2022   AGRATIO 2.4 (H) 05/02/2020   BILITOT 0.4 12/10/2022   ALKPHOS 51 12/10/2022   AST 16 12/10/2022   ALT 18 12/10/2022   ANIONGAP 6 03/05/2021   Last lipids Lab Results  Component Value Date   CHOL 190 12/10/2022   HDL 72 12/10/2022   LDLCALC 107 (H) 12/10/2022   TRIG 57 12/10/2022   CHOLHDL 2.6 12/10/2022   Last hemoglobin A1c Lab Results  Component Value Date   HGBA1C 5.1 12/10/2022   Last thyroid  functions Lab Results  Component Value Date   TSH 2.320 02/04/2023   T3TOTAL 126 12/10/2022   Last vitamin D  Lab Results  Component Value Date   VD25OH 13.4 (L) 12/10/2022        Assessment & Plan:    Routine Health Maintenance and Physical Exam  Immunization History  Administered Date(s) Administered   Influenza-Unspecified 01/06/2020, 03/08/2021, 12/16/2022   Janssen (J&J) SARS-COV-2 Vaccination 05/07/2019   Tdap 03/05/2021    Health Maintenance  Topic Date Due   Hepatitis C Screening  Never done   Pneumococcal Vaccine 18-67 Years old (1 of 2 - PCV) Never done   COVID-19 Vaccine (2 - Janssen risk series) 06/04/2019   INFLUENZA VACCINE  09/25/2023   Cervical Cancer Screening (HPV/Pap Cotest)  09/17/2027   DTaP/Tdap/Td (2 - Td or Tdap) 03/06/2031   HIV Screening  Completed   HPV VACCINES  Aged Out   Meningococcal B Vaccine  Aged Out    Discussed health benefits of physical activity, and encouraged her to engage in regular exercise appropriate for her age and condition.  Problem List Items Addressed This Visit   None  No follow-ups on file. Annual physical exam  Encounter for lipid screening for cardiovascular disease -      Lipid panel  Impaired glucose tolerance -     CBC with Differential/Platelet -     Comprehensive metabolic panel with GFR -     Hemoglobin A1c  Need for vaccination against Streptococcus pneumoniae -     Pneumococcal conjugate vaccine 20-valent  Screening for thyroid  disorder -     TSH -     T4, free  Encounter for vitamin deficiency screening -     VITAMIN D  25 Hydroxy (Vit-D Deficiency, Fractures)   Screening labs including thyroid  and Vitamin D . Prevnar 20 today     Manette Section, MD

## 2023-06-24 DIAGNOSIS — F4312 Post-traumatic stress disorder, chronic: Secondary | ICD-10-CM | POA: Diagnosis not present

## 2023-06-26 ENCOUNTER — Other Ambulatory Visit (HOSPITAL_COMMUNITY): Payer: Self-pay

## 2023-06-28 ENCOUNTER — Other Ambulatory Visit (HOSPITAL_COMMUNITY): Payer: Self-pay

## 2023-06-29 ENCOUNTER — Other Ambulatory Visit: Payer: Self-pay

## 2023-06-29 DIAGNOSIS — F4312 Post-traumatic stress disorder, chronic: Secondary | ICD-10-CM | POA: Diagnosis not present

## 2023-06-30 ENCOUNTER — Other Ambulatory Visit (HOSPITAL_COMMUNITY): Payer: Self-pay

## 2023-06-30 MED ORDER — AMPHETAMINE-DEXTROAMPHETAMINE 20 MG PO TABS
20.0000 mg | ORAL_TABLET | Freq: Three times a day (TID) | ORAL | 0 refills | Status: DC
  Filled 2023-07-02: qty 90, 30d supply, fill #0

## 2023-07-02 ENCOUNTER — Other Ambulatory Visit: Payer: Self-pay

## 2023-07-02 ENCOUNTER — Other Ambulatory Visit (HOSPITAL_COMMUNITY): Payer: Self-pay

## 2023-07-03 ENCOUNTER — Other Ambulatory Visit (HOSPITAL_COMMUNITY): Payer: Self-pay

## 2023-07-04 ENCOUNTER — Encounter (HOSPITAL_COMMUNITY): Payer: Self-pay

## 2023-07-04 ENCOUNTER — Other Ambulatory Visit (HOSPITAL_COMMUNITY): Payer: Self-pay

## 2023-07-05 ENCOUNTER — Other Ambulatory Visit (HOSPITAL_COMMUNITY): Payer: Self-pay

## 2023-07-06 ENCOUNTER — Other Ambulatory Visit: Payer: Self-pay

## 2023-07-06 ENCOUNTER — Other Ambulatory Visit (HOSPITAL_COMMUNITY): Payer: Self-pay

## 2023-07-07 DIAGNOSIS — F411 Generalized anxiety disorder: Secondary | ICD-10-CM | POA: Diagnosis not present

## 2023-07-08 DIAGNOSIS — G629 Polyneuropathy, unspecified: Secondary | ICD-10-CM | POA: Diagnosis not present

## 2023-07-08 DIAGNOSIS — G5632 Lesion of radial nerve, left upper limb: Secondary | ICD-10-CM | POA: Diagnosis not present

## 2023-07-10 DIAGNOSIS — F4312 Post-traumatic stress disorder, chronic: Secondary | ICD-10-CM | POA: Diagnosis not present

## 2023-07-13 DIAGNOSIS — F4312 Post-traumatic stress disorder, chronic: Secondary | ICD-10-CM | POA: Diagnosis not present

## 2023-07-14 ENCOUNTER — Other Ambulatory Visit (HOSPITAL_COMMUNITY): Payer: Self-pay

## 2023-07-14 DIAGNOSIS — G5632 Lesion of radial nerve, left upper limb: Secondary | ICD-10-CM | POA: Diagnosis not present

## 2023-07-14 DIAGNOSIS — S42352D Displaced comminuted fracture of shaft of humerus, left arm, subsequent encounter for fracture with routine healing: Secondary | ICD-10-CM | POA: Diagnosis not present

## 2023-07-14 MED ORDER — GABAPENTIN 300 MG PO CAPS
300.0000 mg | ORAL_CAPSULE | Freq: Three times a day (TID) | ORAL | 3 refills | Status: AC
  Filled 2023-07-14: qty 270, 90d supply, fill #0

## 2023-07-15 DIAGNOSIS — G5633 Lesion of radial nerve, bilateral upper limbs: Secondary | ICD-10-CM | POA: Diagnosis not present

## 2023-07-15 DIAGNOSIS — F4312 Post-traumatic stress disorder, chronic: Secondary | ICD-10-CM | POA: Diagnosis not present

## 2023-07-15 DIAGNOSIS — G5631 Lesion of radial nerve, right upper limb: Secondary | ICD-10-CM | POA: Insufficient documentation

## 2023-07-17 DIAGNOSIS — M25632 Stiffness of left wrist, not elsewhere classified: Secondary | ICD-10-CM | POA: Diagnosis not present

## 2023-07-17 DIAGNOSIS — M25622 Stiffness of left elbow, not elsewhere classified: Secondary | ICD-10-CM | POA: Diagnosis not present

## 2023-07-17 DIAGNOSIS — G5632 Lesion of radial nerve, left upper limb: Secondary | ICD-10-CM | POA: Diagnosis not present

## 2023-07-17 DIAGNOSIS — Z9889 Other specified postprocedural states: Secondary | ICD-10-CM | POA: Diagnosis not present

## 2023-07-17 DIAGNOSIS — M25612 Stiffness of left shoulder, not elsewhere classified: Secondary | ICD-10-CM | POA: Diagnosis not present

## 2023-07-17 DIAGNOSIS — S42352S Displaced comminuted fracture of shaft of humerus, left arm, sequela: Secondary | ICD-10-CM | POA: Diagnosis not present

## 2023-07-17 DIAGNOSIS — Z789 Other specified health status: Secondary | ICD-10-CM | POA: Diagnosis not present

## 2023-07-17 DIAGNOSIS — M25642 Stiffness of left hand, not elsewhere classified: Secondary | ICD-10-CM | POA: Diagnosis not present

## 2023-07-17 DIAGNOSIS — S42352A Displaced comminuted fracture of shaft of humerus, left arm, initial encounter for closed fracture: Secondary | ICD-10-CM | POA: Diagnosis not present

## 2023-07-17 DIAGNOSIS — G5631 Lesion of radial nerve, right upper limb: Secondary | ICD-10-CM | POA: Diagnosis not present

## 2023-07-22 ENCOUNTER — Other Ambulatory Visit (HOSPITAL_COMMUNITY): Payer: Self-pay

## 2023-07-22 DIAGNOSIS — F411 Generalized anxiety disorder: Secondary | ICD-10-CM | POA: Diagnosis not present

## 2023-07-24 DIAGNOSIS — M25612 Stiffness of left shoulder, not elsewhere classified: Secondary | ICD-10-CM | POA: Diagnosis not present

## 2023-07-24 DIAGNOSIS — S42352A Displaced comminuted fracture of shaft of humerus, left arm, initial encounter for closed fracture: Secondary | ICD-10-CM | POA: Diagnosis not present

## 2023-07-24 DIAGNOSIS — M25632 Stiffness of left wrist, not elsewhere classified: Secondary | ICD-10-CM | POA: Diagnosis not present

## 2023-07-24 DIAGNOSIS — M25622 Stiffness of left elbow, not elsewhere classified: Secondary | ICD-10-CM | POA: Diagnosis not present

## 2023-07-24 DIAGNOSIS — S42352S Displaced comminuted fracture of shaft of humerus, left arm, sequela: Secondary | ICD-10-CM | POA: Diagnosis not present

## 2023-07-24 DIAGNOSIS — G5631 Lesion of radial nerve, right upper limb: Secondary | ICD-10-CM | POA: Diagnosis not present

## 2023-07-24 DIAGNOSIS — Z9889 Other specified postprocedural states: Secondary | ICD-10-CM | POA: Diagnosis not present

## 2023-07-24 DIAGNOSIS — Z789 Other specified health status: Secondary | ICD-10-CM | POA: Diagnosis not present

## 2023-07-24 DIAGNOSIS — M25642 Stiffness of left hand, not elsewhere classified: Secondary | ICD-10-CM | POA: Diagnosis not present

## 2023-07-24 DIAGNOSIS — G5632 Lesion of radial nerve, left upper limb: Secondary | ICD-10-CM | POA: Diagnosis not present

## 2023-07-27 DIAGNOSIS — G5631 Lesion of radial nerve, right upper limb: Secondary | ICD-10-CM | POA: Diagnosis not present

## 2023-07-27 DIAGNOSIS — M25622 Stiffness of left elbow, not elsewhere classified: Secondary | ICD-10-CM | POA: Diagnosis not present

## 2023-07-27 DIAGNOSIS — M25632 Stiffness of left wrist, not elsewhere classified: Secondary | ICD-10-CM | POA: Diagnosis not present

## 2023-07-27 DIAGNOSIS — S42352S Displaced comminuted fracture of shaft of humerus, left arm, sequela: Secondary | ICD-10-CM | POA: Diagnosis not present

## 2023-07-27 DIAGNOSIS — G5632 Lesion of radial nerve, left upper limb: Secondary | ICD-10-CM | POA: Diagnosis not present

## 2023-07-27 DIAGNOSIS — M25642 Stiffness of left hand, not elsewhere classified: Secondary | ICD-10-CM | POA: Diagnosis not present

## 2023-07-27 DIAGNOSIS — Z789 Other specified health status: Secondary | ICD-10-CM | POA: Diagnosis not present

## 2023-07-27 DIAGNOSIS — Z9889 Other specified postprocedural states: Secondary | ICD-10-CM | POA: Diagnosis not present

## 2023-07-27 DIAGNOSIS — S42352A Displaced comminuted fracture of shaft of humerus, left arm, initial encounter for closed fracture: Secondary | ICD-10-CM | POA: Diagnosis not present

## 2023-07-27 DIAGNOSIS — M25612 Stiffness of left shoulder, not elsewhere classified: Secondary | ICD-10-CM | POA: Diagnosis not present

## 2023-08-03 DIAGNOSIS — F4312 Post-traumatic stress disorder, chronic: Secondary | ICD-10-CM | POA: Diagnosis not present

## 2023-08-04 ENCOUNTER — Other Ambulatory Visit (HOSPITAL_COMMUNITY): Payer: Self-pay

## 2023-08-04 DIAGNOSIS — Z789 Other specified health status: Secondary | ICD-10-CM | POA: Diagnosis not present

## 2023-08-04 DIAGNOSIS — G5631 Lesion of radial nerve, right upper limb: Secondary | ICD-10-CM | POA: Diagnosis not present

## 2023-08-04 DIAGNOSIS — F4312 Post-traumatic stress disorder, chronic: Secondary | ICD-10-CM | POA: Diagnosis not present

## 2023-08-04 DIAGNOSIS — M25632 Stiffness of left wrist, not elsewhere classified: Secondary | ICD-10-CM | POA: Diagnosis not present

## 2023-08-04 DIAGNOSIS — G5632 Lesion of radial nerve, left upper limb: Secondary | ICD-10-CM | POA: Diagnosis not present

## 2023-08-04 DIAGNOSIS — S42352A Displaced comminuted fracture of shaft of humerus, left arm, initial encounter for closed fracture: Secondary | ICD-10-CM | POA: Diagnosis not present

## 2023-08-04 DIAGNOSIS — M25622 Stiffness of left elbow, not elsewhere classified: Secondary | ICD-10-CM | POA: Diagnosis not present

## 2023-08-04 DIAGNOSIS — M25612 Stiffness of left shoulder, not elsewhere classified: Secondary | ICD-10-CM | POA: Diagnosis not present

## 2023-08-04 DIAGNOSIS — S42352S Displaced comminuted fracture of shaft of humerus, left arm, sequela: Secondary | ICD-10-CM | POA: Diagnosis not present

## 2023-08-04 DIAGNOSIS — Z9889 Other specified postprocedural states: Secondary | ICD-10-CM | POA: Diagnosis not present

## 2023-08-04 DIAGNOSIS — M25642 Stiffness of left hand, not elsewhere classified: Secondary | ICD-10-CM | POA: Diagnosis not present

## 2023-08-04 MED ORDER — AMPHETAMINE-DEXTROAMPHETAMINE 20 MG PO TABS
20.0000 mg | ORAL_TABLET | Freq: Three times a day (TID) | ORAL | 0 refills | Status: DC
  Filled 2023-08-04: qty 90, 30d supply, fill #0

## 2023-08-05 DIAGNOSIS — F411 Generalized anxiety disorder: Secondary | ICD-10-CM | POA: Diagnosis not present

## 2023-08-10 ENCOUNTER — Ambulatory Visit (HOSPITAL_BASED_OUTPATIENT_CLINIC_OR_DEPARTMENT_OTHER): Admitting: Orthopaedic Surgery

## 2023-08-10 ENCOUNTER — Ambulatory Visit: Admitting: Family Medicine

## 2023-08-10 DIAGNOSIS — S42365A Nondisplaced segmental fracture of shaft of humerus, left arm, initial encounter for closed fracture: Secondary | ICD-10-CM | POA: Diagnosis not present

## 2023-08-10 NOTE — Progress Notes (Signed)
 Chief Complaint: Left humerus fracture     History of Present Illness:    Susan Boyle is a 33 y.o. female presents today 19-month status post left humeral open reduction internal fixation that was done at [facility] and once in [place].  Since that time she has had a radial nerve palsy with pain and paresthesias up and down the arm.  She is also complaining of shoulder pain.  She is hoping to pursue occupational therapy at this time    PMH/PSH/Family History/Social History/Meds/Allergies:    Past Medical History:  Diagnosis Date   Allergy    Seasonal. Taking allergy injections   Anxiety    Asthma    Back pain, chronic    Comminuted fracture    Left arm   Depression    Depression screen 12/10/2022   Elevated cholesterol with high triglycerides 11/26/2022   Gastroesophageal reflux disease 12/10/2022   Heartburn    Joint pain    Knee pain, chronic    Neuromuscular disorder (HCC) 02/18/2023   Radial nerve palsy   Radial nerve palsy, left    Shortness of breath    Past Surgical History:  Procedure Laterality Date   FRACTURE SURGERY  02/18/2023   HERNIA REPAIR     inguinal hernia repair as infant   NERVE, TENDON AND ARTERY REPAIR Left 07/20/2013   Procedure: NERVE,  REPAIR;  Surgeon: Kemp Patter, MD;  Location: [facility];  Service: Orthopedics;  Laterality: Left;   thumb surgery Left    Nerve repair   Social History   Socioeconomic History   Marital status: Married    Spouse name: Not on file   Number of children: 0   Years of education: Not on file   Highest education level: Some college, no degree  Occupational History   Not on file  Tobacco Use   Smoking status: Every Day    Types: E-cigarettes   Smokeless tobacco: Never   Tobacco comments:    stopped in November 2021   Vaping Use   Vaping status: Former   Substances: Nicotine   Devices: Current: Nicotine patch  Substance and Sexual Activity   Alcohol use: Yes     Alcohol/week: 4.0 standard drinks of alcohol    Types: 4 Glasses of wine per week    Comment: social   Drug use: Not Currently    Types: Marijuana   Sexual activity: Yes    Birth control/protection: I.U.D.  Other Topics Concern   Not on file  Social History Narrative   Not on file   Social Drivers of Health   Financial Resource Strain: High Risk (06/23/2023)   Overall Financial Resource Strain (CARDIA)    Difficulty of Paying Living Expenses: Very hard  Food Insecurity: Food Insecurity Present (06/23/2023)   Hunger Vital Sign    Worried About Running Out of Food in the Last Year: Often true    Ran Out of Food in the Last Year: Sometimes true  Transportation Needs: Unmet Transportation Needs (06/23/2023)   PRAPARE - Administrator, Civil Service (Medical): Yes    Lack of Transportation (Non-Medical): Yes  Physical Activity: Insufficiently Active (06/23/2023)   Exercise Vital Sign    Days of Exercise per Week: 2 days    Minutes of Exercise per Session: 60 min  Stress: Stress Concern Present (06/23/2023)   [employer] of Occupational Health - Occupational Stress Questionnaire    Feeling of Stress : To some extent  Social Connections: Socially Integrated (06/23/2023)   Social Connection and Isolation Panel    Frequency of Communication with Friends and Family: More than three times a week    Frequency of Social Gatherings with Friends and Family: Once a week    Attends Religious Services: 1 to 4 times per year    Active Member of [employer] or Organizations: Yes    Attends Engineer, structural: More than 4 times per year    Marital Status: Living with partner   Family History  Adopted: Yes  Problem Relation Age of Onset   Hyperlipidemia Father    Hypertension Father    Heart disease Father 21       MI   Obesity Father    Vision loss Maternal Grandmother    Cancer Maternal Grandfather    Vision loss Maternal Uncle    Allergies  Allergen Reactions    Other Anaphylaxis    Campbell's Clam/Corn Chowder    Sulfamethoxazole Nausea And Vomiting   Current Outpatient Medications  Medication Sig Dispense Refill   albuterol  (VENTOLIN  HFA) 108 (90 Base) MCG/ACT inhaler Inhale 2 puffs into the lungs every 4 (four) hours as needed for wheezing or shortness of breath (coughing fits). 6.7 g 1   amphetamine -dextroamphetamine  (ADDERALL) 20 MG tablet Take 1 tablet (20 mg total) by mouth 3 (three) times daily in the morning, at 12 (noon), and 4 (four) pm. 90 tablet 0   amphetamine -dextroamphetamine  (ADDERALL) 20 MG tablet Take 1 tablet (20 mg total) by mouth 3 (three) times daily. 90 tablet 0   buPROPion  (WELLBUTRIN  XL) 150 MG 24 hr tablet Take 1 tablet (150 mg total) by mouth at bedtime. 90 tablet 3   EPINEPHrine  (EPIPEN  2-PAK) 0.3 mg/0.3 mL IJ SOAJ injection Inject 0.3 mg into the muscle as needed for anaphylaxis. 2 each 1   fluticasone  (FLONASE ) 50 MCG/ACT nasal spray Place 1-2 sprays into both nostrils daily as needed (nasal congestion). 16 g 5   fluticasone -salmeterol (ADVAIR ) 250-50 MCG/ACT AEPB Inhale 1 puff into the lungs in the morning and at bedtime. 60 each 5   gabapentin  (NEURONTIN ) 300 MG capsule Take 1 capsule (300 mg total) by mouth 3 (three) times daily. (Patient taking differently: Take 300 mg by mouth 3 (three) times daily. Taking 800MG  3 times daily) 270 capsule 3   gabapentin  (NEURONTIN ) 300 MG capsule Take 1 capsule (300 mg total) by mouth 3 (three) times daily. 270 capsule 3   levonorgestrel (MIRENA) 20 MCG/24HR IUD 1 each by Intrauterine route once.     pantoprazole  (PROTONIX ) 40 MG tablet Take 1 tablet (40 mg total) by mouth daily. 30 tablet 1   PARoxetine  (PAXIL ) 20 MG tablet Take 1 tablet (20 mg total) by mouth at bedtime. 90 tablet 3   No current facility-administered medications for this visit.   No results found.  Review of Systems:   A ROS was performed including pertinent positives and negatives as documented in the  HPI.  Physical Exam :   Constitutional: NAD and appears stated age Neurological: Alert and oriented Psych: Appropriate affect and cooperative There were no vitals taken for this visit.   Comprehensive Musculoskeletal Exam:    There is a nerve palsy on the left side in terms of the radial nerve.  She has weak wrist flexion with no active wrist extension aside from cheating through the flexor ulnar mechanism.  She lacks thumb extension.  Incision is well-appearing without erythema or drainage.  2+ radial pulse.   Imaging:  I personally reviewed and interpreted the radiographs.   Assessment and Plan:   33 y.o. female with a left humeral shaft open duction internal fixation about 6 months.  At this time she is hoping to pursue occupational therapy specifically hand therapy here at [facility] which I would agree with due to her radial nerve palsy.  At this time she is not currently electing for me to take over manage of her humeral open reduction internal fixation although I have offered this from a convenience sake if she would prefer.   I personally saw and evaluated the patient, and participated in the management and treatment plan.  Wilhelmenia Harada, MD Attending Physician, Orthopedic Surgery  This document was dictated using Dragon voice recognition software. A reasonable attempt at proof reading has been made to minimize errors.

## 2023-08-11 DIAGNOSIS — Z9889 Other specified postprocedural states: Secondary | ICD-10-CM | POA: Diagnosis not present

## 2023-08-11 DIAGNOSIS — S42352A Displaced comminuted fracture of shaft of humerus, left arm, initial encounter for closed fracture: Secondary | ICD-10-CM | POA: Diagnosis not present

## 2023-08-11 DIAGNOSIS — Z789 Other specified health status: Secondary | ICD-10-CM | POA: Diagnosis not present

## 2023-08-11 DIAGNOSIS — M25642 Stiffness of left hand, not elsewhere classified: Secondary | ICD-10-CM | POA: Diagnosis not present

## 2023-08-11 DIAGNOSIS — M25632 Stiffness of left wrist, not elsewhere classified: Secondary | ICD-10-CM | POA: Diagnosis not present

## 2023-08-11 DIAGNOSIS — S42352S Displaced comminuted fracture of shaft of humerus, left arm, sequela: Secondary | ICD-10-CM | POA: Diagnosis not present

## 2023-08-11 DIAGNOSIS — G5632 Lesion of radial nerve, left upper limb: Secondary | ICD-10-CM | POA: Diagnosis not present

## 2023-08-11 DIAGNOSIS — G5631 Lesion of radial nerve, right upper limb: Secondary | ICD-10-CM | POA: Diagnosis not present

## 2023-08-11 DIAGNOSIS — M25622 Stiffness of left elbow, not elsewhere classified: Secondary | ICD-10-CM | POA: Diagnosis not present

## 2023-08-11 DIAGNOSIS — M25612 Stiffness of left shoulder, not elsewhere classified: Secondary | ICD-10-CM | POA: Diagnosis not present

## 2023-08-18 ENCOUNTER — Ambulatory Visit: Admitting: Family Medicine

## 2023-08-19 DIAGNOSIS — M25632 Stiffness of left wrist, not elsewhere classified: Secondary | ICD-10-CM | POA: Diagnosis not present

## 2023-08-19 DIAGNOSIS — G5631 Lesion of radial nerve, right upper limb: Secondary | ICD-10-CM | POA: Diagnosis not present

## 2023-08-19 DIAGNOSIS — S42352S Displaced comminuted fracture of shaft of humerus, left arm, sequela: Secondary | ICD-10-CM | POA: Diagnosis not present

## 2023-08-19 DIAGNOSIS — M25642 Stiffness of left hand, not elsewhere classified: Secondary | ICD-10-CM | POA: Diagnosis not present

## 2023-08-19 DIAGNOSIS — G5632 Lesion of radial nerve, left upper limb: Secondary | ICD-10-CM | POA: Diagnosis not present

## 2023-08-19 DIAGNOSIS — S42352A Displaced comminuted fracture of shaft of humerus, left arm, initial encounter for closed fracture: Secondary | ICD-10-CM | POA: Diagnosis not present

## 2023-08-19 DIAGNOSIS — M25612 Stiffness of left shoulder, not elsewhere classified: Secondary | ICD-10-CM | POA: Diagnosis not present

## 2023-08-19 DIAGNOSIS — M25622 Stiffness of left elbow, not elsewhere classified: Secondary | ICD-10-CM | POA: Diagnosis not present

## 2023-08-19 DIAGNOSIS — Z789 Other specified health status: Secondary | ICD-10-CM | POA: Diagnosis not present

## 2023-08-19 DIAGNOSIS — Z9889 Other specified postprocedural states: Secondary | ICD-10-CM | POA: Diagnosis not present

## 2023-08-20 ENCOUNTER — Other Ambulatory Visit (HOSPITAL_COMMUNITY): Payer: Self-pay

## 2023-08-25 DIAGNOSIS — F3341 Major depressive disorder, recurrent, in partial remission: Secondary | ICD-10-CM | POA: Diagnosis not present

## 2023-08-25 DIAGNOSIS — F4310 Post-traumatic stress disorder, unspecified: Secondary | ICD-10-CM | POA: Diagnosis not present

## 2023-08-25 DIAGNOSIS — F900 Attention-deficit hyperactivity disorder, predominantly inattentive type: Secondary | ICD-10-CM | POA: Diagnosis not present

## 2023-08-25 DIAGNOSIS — F4321 Adjustment disorder with depressed mood: Secondary | ICD-10-CM | POA: Diagnosis not present

## 2023-08-26 ENCOUNTER — Other Ambulatory Visit (HOSPITAL_COMMUNITY): Payer: Self-pay

## 2023-08-26 MED ORDER — PAROXETINE HCL 40 MG PO TABS
40.0000 mg | ORAL_TABLET | Freq: Every day | ORAL | 3 refills | Status: AC
  Filled 2023-08-26 – 2023-09-01 (×3): qty 90, 90d supply, fill #0
  Filled 2024-01-19: qty 30, 30d supply, fill #1

## 2023-08-26 MED ORDER — PROPRANOLOL HCL 10 MG PO TABS
10.0000 mg | ORAL_TABLET | Freq: Three times a day (TID) | ORAL | 2 refills | Status: AC
  Filled 2023-08-26 – 2023-09-01 (×2): qty 90, 30d supply, fill #0
  Filled 2024-01-19: qty 90, 30d supply, fill #1

## 2023-08-30 ENCOUNTER — Telehealth: Admitting: Emergency Medicine

## 2023-08-30 DIAGNOSIS — R3989 Other symptoms and signs involving the genitourinary system: Secondary | ICD-10-CM

## 2023-08-30 MED ORDER — CEPHALEXIN 500 MG PO CAPS: 500.0000 mg | ORAL_CAPSULE | Freq: Two times a day (BID) | ORAL | 0 refills | Status: AC

## 2023-08-30 NOTE — Progress Notes (Addendum)
E-Visit for Urinary Problems ? ?We are sorry that you are not feeling well.  Here is how we plan to help! ? ?Based on what you shared with me it looks like you most likely have a simple urinary tract infection. ? ?A UTI (Urinary Tract Infection) is a bacterial infection of the bladder. ? ?Most cases of urinary tract infections are simple to treat but a key part of your care is to encourage you to drink plenty of fluids and watch your symptoms carefully. ? ?I have prescribed Keflex 500 mg twice a day for 7 days.  Your symptoms should gradually improve. Call us if the burning in your urine worsens, you develop worsening fever, back pain or pelvic pain or if your symptoms do not resolve after completing the antibiotic. ? ?Urinary tract infections can be prevented by drinking plenty of water to keep your body hydrated.  Also be sure when you wipe, wipe from front to back and don't hold it in!  If possible, empty your bladder every 4 hours. ? ?HOME CARE ?Drink plenty of fluids ?Compete the full course of the antibiotics even if the symptoms resolve ?Remember, when you need to go?go. Holding in your urine can increase the likelihood of getting a UTI! ?GET HELP RIGHT AWAY IF: ?You cannot urinate ?You get a high fever ?Worsening back pain occurs ?You see blood in your urine ?You feel sick to your stomach or throw up ?You feel like you are going to pass out ? ?MAKE SURE YOU  ?Understand these instructions. ?Will watch your condition. ?Will get help right away if you are not doing well or get worse. ? ? ?Thank you for choosing an e-visit. ? ?Your e-visit answers were reviewed by a board certified advanced clinical practitioner to complete your personal care plan. Depending upon the condition, your plan could have included both over the counter or prescription medications. ? ?Please review your pharmacy choice. Make sure the pharmacy is open so you can pick up prescription now. If there is a problem, you may contact your  provider through MyChart messaging and have the prescription routed to another pharmacy.  Your safety is important to us. If you have drug allergies check your prescription carefully.  ? ?For the next 24 hours you can use MyChart to ask questions about today's visit, request a non-urgent call back, or ask for a work or school excuse. ?You will get an email in the next two days asking about your experience. I hope that your e-visit has been valuable and will speed your recovery. ? ?I have spent 5 minutes in review of e-visit questionnaire, review and updating patient chart, medical decision making and response to patient.  ? ?Regena Delucchi, PhD, FNP-BC ?  ?

## 2023-08-31 ENCOUNTER — Encounter: Payer: Self-pay | Admitting: Family Medicine

## 2023-08-31 ENCOUNTER — Encounter (HOSPITAL_COMMUNITY): Payer: Self-pay

## 2023-08-31 ENCOUNTER — Other Ambulatory Visit (HOSPITAL_COMMUNITY): Payer: Self-pay

## 2023-08-31 DIAGNOSIS — R12 Heartburn: Secondary | ICD-10-CM

## 2023-08-31 MED ORDER — PANTOPRAZOLE SODIUM 40 MG PO TBEC
40.0000 mg | DELAYED_RELEASE_TABLET | Freq: Every day | ORAL | 1 refills | Status: AC
Start: 2023-08-31 — End: ?
  Filled 2023-08-31 – 2023-09-01 (×2): qty 90, 90d supply, fill #0
  Filled 2023-12-31: qty 30, 30d supply, fill #1

## 2023-09-01 ENCOUNTER — Other Ambulatory Visit (HOSPITAL_COMMUNITY): Payer: Self-pay

## 2023-09-01 ENCOUNTER — Other Ambulatory Visit: Payer: Self-pay

## 2023-09-02 ENCOUNTER — Other Ambulatory Visit: Payer: Self-pay

## 2023-09-02 ENCOUNTER — Other Ambulatory Visit (HOSPITAL_COMMUNITY): Payer: Self-pay

## 2023-09-02 DIAGNOSIS — F411 Generalized anxiety disorder: Secondary | ICD-10-CM | POA: Diagnosis not present

## 2023-09-03 ENCOUNTER — Other Ambulatory Visit: Payer: Self-pay

## 2023-09-03 ENCOUNTER — Other Ambulatory Visit (HOSPITAL_COMMUNITY): Payer: Self-pay

## 2023-09-03 MED ORDER — AMPHETAMINE-DEXTROAMPHETAMINE 20 MG PO TABS
20.0000 mg | ORAL_TABLET | Freq: Three times a day (TID) | ORAL | 0 refills | Status: DC
  Filled 2023-09-03: qty 90, 30d supply, fill #0

## 2023-09-04 ENCOUNTER — Encounter: Payer: Self-pay | Admitting: Family Medicine

## 2023-09-04 DIAGNOSIS — R61 Generalized hyperhidrosis: Secondary | ICD-10-CM

## 2023-09-07 DIAGNOSIS — F4321 Adjustment disorder with depressed mood: Secondary | ICD-10-CM | POA: Diagnosis not present

## 2023-09-07 DIAGNOSIS — F3341 Major depressive disorder, recurrent, in partial remission: Secondary | ICD-10-CM | POA: Diagnosis not present

## 2023-09-07 DIAGNOSIS — F900 Attention-deficit hyperactivity disorder, predominantly inattentive type: Secondary | ICD-10-CM | POA: Diagnosis not present

## 2023-09-07 DIAGNOSIS — F4310 Post-traumatic stress disorder, unspecified: Secondary | ICD-10-CM | POA: Diagnosis not present

## 2023-09-11 ENCOUNTER — Other Ambulatory Visit (HOSPITAL_COMMUNITY): Payer: Self-pay

## 2023-09-11 ENCOUNTER — Ambulatory Visit: Admission: EM | Admit: 2023-09-11 | Discharge: 2023-09-11 | Disposition: A | Discharge status: Home / Self Care

## 2023-09-11 DIAGNOSIS — R300 Dysuria: Secondary | ICD-10-CM

## 2023-09-11 DIAGNOSIS — N3000 Acute cystitis without hematuria: Secondary | ICD-10-CM | POA: Diagnosis not present

## 2023-09-11 DIAGNOSIS — Z87440 Personal history of urinary (tract) infections: Secondary | ICD-10-CM | POA: Diagnosis present

## 2023-09-11 LAB — POCT URINALYSIS DIP (MANUAL ENTRY)
Bilirubin, UA: NEGATIVE
Glucose, UA: NEGATIVE mg/dL
Ketones, POC UA: NEGATIVE mg/dL
Nitrite, UA: NEGATIVE
Protein Ur, POC: NEGATIVE mg/dL
Spec Grav, UA: 1.02 (ref 1.010–1.025)
Urobilinogen, UA: 0.2 U/dL
pH, UA: 5.5 (ref 5.0–8.0)

## 2023-09-11 LAB — POCT URINE PREGNANCY: Preg Test, Ur: NEGATIVE

## 2023-09-11 MED ORDER — NITROFURANTOIN MONOHYD MACRO 100 MG PO CAPS: 100.0000 mg | ORAL_CAPSULE | Freq: Two times a day (BID) | ORAL | 0 refills | Status: AC

## 2023-09-11 NOTE — ED Triage Notes (Signed)
 Pt states for the last 1.5 days she has been having urinary sx including cloudy urine and mild burning while urination. She had a UTI two weeks ago and then she forget to take last two dose of her abx.  Home Intervnetions: None

## 2023-09-11 NOTE — Discharge Instructions (Signed)
 We have sent your urine off for culture.  We should get these results in about 3 to 5 days.  If we need to make any changes to your antibiotic, we will call you and let you know.  We have sent in an antibiotic for UTI.  We recommend following up with your primary care provider if symptoms are not improving.  Please return or go to the emergency department if you develop any significant abdominal pain, back pain, fever, vomiting, or if you have any other concerns.

## 2023-09-11 NOTE — ED Provider Notes (Signed)
 Susan Boyle    CSN: [id number] Arrival date & time: 09/11/23  1015      History   Chief Complaint Chief Complaint  Patient presents with   Burning with Urination    HPI Susan Boyle is a 33 y.o. female.   Patient is a 33 year old female who presents to the urgent care today with concerns of possible UTI.  She reports that for the past day and a half, she has had burning with urination and increased urinary frequency.  She denies any other symptoms including fever, abdominal pain, back pain, vomiting, rash, blood in the urine, chills, or other concerns at this time.  She does report recently having a UTI several weeks ago and forgot to take her last dose of her medication.  She was on Keflex  at that time.    Past Medical History:  Diagnosis Date   Allergy    Seasonal. Taking allergy injections   Anxiety    Asthma    Back pain, chronic    Comminuted fracture    Left arm   Depression    Depression screen 12/10/2022   Elevated cholesterol with high triglycerides 11/26/2022   Gastroesophageal reflux disease 12/10/2022   Heartburn    Joint pain    Knee pain, chronic    Neuromuscular disorder (HCC) 02/18/2023   Radial nerve palsy   Radial nerve palsy, left    Shortness of breath     Patient Active Problem List   Diagnosis Date Noted   Radial tunnel syndrome, right 07/15/2023   Neuropathic pain 06/18/2023   Radial nerve palsy, left 04/02/2023   Decreased activities of daily living (ADL) 03/25/2023   Insulin  resistance 03/25/2023   Decreased range of motion of left elbow 02/26/2023   Decreased range of motion of left shoulder 02/26/2023   Left supracondylar humerus fracture, closed, initial encounter 02/19/2023   Closed displaced comminuted fracture of shaft of left humerus 02/18/2023   Other Specified Feeding or Eating Disorder, Emotional Eating Behaviors 01/26/2023   Vitamin D  deficiency 12/30/2022   SOBOE (shortness of breath on exertion) 12/10/2022    Gastroesophageal reflux disease 12/10/2022   Depression screen 12/10/2022   BMI 31.0-31.9,adult 12/10/2022   Generalized obesity 12/10/2022   Anxiety and depression 11/26/2022   Class 1 obesity due to excess calories with body mass index (BMI) of 31.0 to 31.9 in adult 11/26/2022   Elevated cholesterol with high triglycerides 11/26/2022   Attention deficit hyperactivity disorder (ADHD) 09/17/2022   IUD contraception 09/17/2022   Seasonal and perennial allergic rhinoconjunctivitis 11/05/2020   Allergic reaction 07/04/2020   Food allergy 05/02/2020   Environmental allergies 05/02/2020   Asthma     Past Surgical History:  Procedure Laterality Date   FRACTURE SURGERY  02/18/2023   HERNIA REPAIR     inguinal hernia repair as infant   NERVE, TENDON AND ARTERY REPAIR Left 07/20/2013   Procedure: NERVE,  REPAIR;  Surgeon: Arley JONELLE Curia, MD;  Location: [facility];  Service: Orthopedics;  Laterality: Left;   thumb surgery Left    Nerve repair    OB History     Gravida  0   Para  0   Term  0   Preterm  0   AB  0   Living  0      SAB  0   IAB  0   Ectopic  0   Multiple  0   Live Births  Home Medications    Prior to Admission medications   Medication Sig Start Date End Date Taking? Authorizing Provider  nitrofurantoin , macrocrystal-monohydrate, (MACROBID ) 100 MG capsule Take 1 capsule (100 mg total) by mouth 2 (two) times daily. 09/11/23  Yes Melonie Locus, PA-C  pregabalin (LYRICA) 75 MG capsule Take 75 mg by mouth. 08/10/23 08/09/24 Yes [provider]  albuterol  (VENTOLIN  HFA) 108 (90 Base) MCG/ACT inhaler Inhale 2 puffs into the lungs every 4 (four) hours as needed for wheezing or shortness of breath (coughing fits). 01/21/23   Luke Orlan HERO, DO  amphetamine -dextroamphetamine  (ADDERALL) 20 MG tablet Take 1 tablet (20 mg total) by mouth 3 (three) times daily in the morning, at 12 (noon), and 4 (four) pm. 04/30/23      amphetamine -dextroamphetamine  (ADDERALL) 20 MG tablet Take 1 tablet (20 mg total) by mouth 3 (three) times daily. 09/03/23     buPROPion  (WELLBUTRIN  XL) 150 MG 24 hr tablet Take 1 tablet (150 mg total) by mouth at bedtime. 10/01/22     EPINEPHrine  (EPIPEN  2-PAK) 0.3 mg/0.3 mL IJ SOAJ injection Inject 0.3 mg into the muscle as needed for anaphylaxis. 12/31/22   Luke Orlan HERO, DO  fluticasone  (FLONASE ) 50 MCG/ACT nasal spray Place 1-2 sprays into both nostrils daily as needed (nasal congestion). 01/21/23   Luke Orlan HERO, DO  fluticasone -salmeterol (ADVAIR ) 250-50 MCG/ACT AEPB Inhale 1 puff into the lungs in the morning and at bedtime. 01/26/23   Luke Orlan HERO, DO  gabapentin  (NEURONTIN ) 300 MG capsule Take 1 capsule (300 mg total) by mouth 3 (three) times daily. Patient taking differently: Take 300 mg by mouth 3 (three) times daily. Taking 800MG  3 times daily 03/12/23     gabapentin  (NEURONTIN ) 300 MG capsule Take 1 capsule (300 mg total) by mouth 3 (three) times daily. 07/14/23     levonorgestrel (MIRENA) 20 MCG/24HR IUD 1 each by Intrauterine route once.    [provider]  pantoprazole  (PROTONIX ) 40 MG tablet Take 1 tablet (40 mg total) by mouth daily. 08/31/23   Colette Torrence GRADE, MD  PARoxetine  (PAXIL ) 20 MG tablet Take 1 tablet (20 mg total) by mouth at bedtime. 10/01/22     PARoxetine  (PAXIL ) 40 MG tablet Take 1 tablet (40 mg total) by mouth daily. 08/26/23     propranolol  (INDERAL ) 10 MG tablet Take 1 tablet (10 mg total) by mouth 3 (three) times daily. 08/26/23       Family History Family History  Adopted: Yes  Problem Relation Age of Onset   Hyperlipidemia Father    Hypertension Father    Heart disease Father 29       MI   Obesity Father    Vision loss Maternal Grandmother    Cancer Maternal Grandfather    Vision loss Maternal Uncle     Social History Social History   Tobacco Use   Smoking status: Every Day    Types: E-cigarettes    Passive exposure: Current   Smokeless tobacco:  Never   Tobacco comments:    stopped in November 2021   Vaping Use   Vaping status: Former   Substances: Nicotine   Devices: Current: Nicotine patch  Substance Use Topics   Alcohol use: Yes    Alcohol/week: 4.0 standard drinks of alcohol    Types: 4 Glasses of wine per week    Comment: social   Drug use: Not Currently    Types: Marijuana     Allergies   Other, Augmentin  [amoxicillin -pot clavulanate], and Sulfamethoxazole  Review of Systems Review of Systems See HPI for relevant ROS.  Physical Exam Triage Vital Signs ED Triage Vitals  Encounter Vitals Group     BP 09/11/23 1026 (!) 133/93     Girls Systolic BP Percentile --      Girls Diastolic BP Percentile --      Boys Systolic BP Percentile --      Boys Diastolic BP Percentile --      Pulse Rate 09/11/23 1026 94     Resp 09/11/23 1026 18     Temp 09/11/23 1026 98.5 F (36.9 C)     Temp Source 09/11/23 1026 Oral     SpO2 09/11/23 1026 95 %     Weight --      Height --      Head Circumference --      Peak Flow --      Pain Score 09/11/23 1023 0     Pain Loc --      Pain Education --      Exclude from Growth Chart --    No data found.  Updated Vital Signs BP (!) 133/93 (BP Location: Right Arm)   Pulse 94   Temp 98.5 F (36.9 C) (Oral)   Resp 18   SpO2 95%   Visual Acuity Right Eye Distance:   Left Eye Distance:   Bilateral Distance:    Right Eye Near:   Left Eye Near:    Bilateral Near:     Physical Exam General: Alert and oriented, well-developed/well-nourished, calm, cooperative, no acute distress HEENT: Normocephalic atraumatic, moist mucous membranes, no scleral icterus, trachea midline Lungs: Speaking full sentences, non-labored respirations, no distress Heart: Regular rate and rhythm Abdomen:  Soft, nondistended, nontender Musculoskeletal: Moves all extremities well Back: No CVA tenderness Neurologic: Awake, A&O x4, gait normal Integumentary: Warm, dry, normal for ethnicity, intact,  no rash Psychiatric: Appropriate mood & affect  Boyle Treatments / Results  Labs (all labs ordered are listed, but only abnormal results are displayed) Labs Reviewed  POCT URINALYSIS DIP (MANUAL ENTRY) - Abnormal; Notable for the following components:      Result Value   Color, UA light yellow (*)    Clarity, UA cloudy (*)    Blood, UA small (*)    Leukocytes, UA Small (1+) (*)    All other components within normal limits  URINE CULTURE  POCT URINE PREGNANCY    EKG   Radiology No results found.  Procedures Procedures (including critical care time)  Medications Ordered in Boyle Medications - No data to display  Initial Impression / Assessment and Plan / Boyle Course  I have reviewed the triage vital signs and the nursing notes.  Pertinent labs & imaging results that were available during my care of the patient were reviewed by me and considered in my medical decision making (see chart for details).    Patient presents with dysuria.  Differential diagnosis includes: Acute uncomplicated cystitis, pyelonephritis, nephrolithiasis, ureterolithiasis, ectopic pregnancy, ovarian torsion, ovarian cyst, appendicitis, including other diagnoses.  History obtained from: Patient.  Plan at this time/rationale: UA, urine culture.  All ordered tests including imaging and labs were independently reviewed and interpreted by myself. Notable findings: UA positive for leukocytes and blood, hCG negative, urine culture results pending.  Plan: This patient presents with symptoms consistent with acute uncomplicated cystitis. No systemic symptoms. Not septic. Well appearing. Low suspicion for acute pyelonephritis given lack of fever, CVAT, or systemic features. Low suspicion for kidney stone or infected  stone. Hcg negative so unlikely for pregnancy/ectopic pregnancy. Low suspicion for ovarian torsion, PID, or appendicitis. Prescribed patient Macrobid . Patient should follow up with their PCP in the next  several days. Return precautions to the urgent care or emergency department were discussed including if symptoms worsen, they start having any systemic symptoms, or if they have any other concerns.  Disposition: Stable to discharge home.   All questions answered to the best of this examiner's ability. Advised to f/u with PCP for further eval and/or reassessment. Patient agrees to plan.  An appropriate evaluation has been performed, and in my medical judgment there is currently no evidence of an immediate life-threatening or surgical condition. Discharge is therefore indicated at this time.  This document was created using the aid of voice recognition Scientist, clinical (histocompatibility and immunogenetics). Final Clinical Impressions(s) / Boyle Diagnoses   Final diagnoses:  Dysuria  Acute cystitis without hematuria     Discharge Instructions      We have sent your urine off for culture.  We should get these results in about 3 to 5 days.  If we need to make any changes to your antibiotic, we will call you and let you know.  We have sent in an antibiotic for UTI.  We recommend following up with your primary care provider if symptoms are not improving.  Please return or go to the emergency department if you develop any significant abdominal pain, back pain, fever, vomiting, or if you have any other concerns.    ED Prescriptions     Medication Sig Dispense Auth. Provider   nitrofurantoin , macrocrystal-monohydrate, (MACROBID ) 100 MG capsule Take 1 capsule (100 mg total) by mouth 2 (two) times daily. 10 capsule Melonie Locus, PA-C      PDMP not reviewed this encounter.   Melonie Locus, PA-C 09/11/23 1047

## 2023-09-13 LAB — URINE CULTURE: Culture: >=100000 — AB

## 2023-09-14 ENCOUNTER — Other Ambulatory Visit (HOSPITAL_COMMUNITY): Payer: Self-pay

## 2023-09-14 ENCOUNTER — Ambulatory Visit (HOSPITAL_COMMUNITY): Payer: Self-pay

## 2023-09-14 MED ORDER — NITROFURANTOIN MACROCRYSTAL 100 MG PO CAPS
100.0000 mg | ORAL_CAPSULE | Freq: Two times a day (BID) | ORAL | 0 refills | Status: AC
  Filled 2023-09-14: qty 10, 5d supply, fill #0

## 2023-09-14 MED ORDER — DRYSOL 20 % EX SOLN
CUTANEOUS | 3 refills | Status: AC
  Filled 2023-09-14 – 2023-12-31 (×2): qty 60, 30d supply, fill #0

## 2023-09-14 MED ORDER — ALUMINUM CHLORIDE 20 % EX SOLN: Freq: Every evening | CUTANEOUS | 3 refills | Status: AC | PRN

## 2023-09-15 ENCOUNTER — Other Ambulatory Visit (HOSPITAL_COMMUNITY): Payer: Self-pay

## 2023-09-15 ENCOUNTER — Other Ambulatory Visit: Payer: Self-pay

## 2023-09-18 ENCOUNTER — Other Ambulatory Visit: Payer: Self-pay

## 2023-10-15 ENCOUNTER — Other Ambulatory Visit (HOSPITAL_COMMUNITY): Payer: Self-pay

## 2023-10-20 ENCOUNTER — Other Ambulatory Visit (HOSPITAL_COMMUNITY): Payer: Self-pay

## 2023-10-27 ENCOUNTER — Encounter: Payer: Self-pay | Admitting: Sports Medicine

## 2023-10-27 ENCOUNTER — Other Ambulatory Visit (HOSPITAL_COMMUNITY): Payer: Self-pay

## 2023-10-30 ENCOUNTER — Other Ambulatory Visit (HOSPITAL_COMMUNITY): Payer: Self-pay

## 2023-11-02 ENCOUNTER — Other Ambulatory Visit (HOSPITAL_COMMUNITY): Payer: Self-pay

## 2023-11-02 ENCOUNTER — Other Ambulatory Visit: Payer: Self-pay

## 2023-11-02 MED ORDER — AMPHETAMINE-DEXTROAMPHETAMINE 20 MG PO TABS
20.0000 mg | ORAL_TABLET | Freq: Three times a day (TID) | ORAL | 0 refills | Status: DC
  Filled 2023-11-02: qty 90, 30d supply, fill #0

## 2023-11-18 ENCOUNTER — Encounter (HOSPITAL_COMMUNITY): Payer: Self-pay

## 2023-11-19 ENCOUNTER — Other Ambulatory Visit (HOSPITAL_COMMUNITY): Payer: Self-pay

## 2023-11-20 ENCOUNTER — Other Ambulatory Visit (HOSPITAL_COMMUNITY): Payer: Self-pay

## 2023-11-20 MED ORDER — VILAZODONE HCL 20 MG PO TABS
20.0000 mg | ORAL_TABLET | Freq: Every day | ORAL | 11 refills | Status: AC
  Filled 2023-11-25: qty 30, 30d supply, fill #0
  Filled 2023-12-23: qty 30, 30d supply, fill #1
  Filled 2024-01-19: qty 30, 30d supply, fill #2

## 2023-11-20 MED ORDER — BUPROPION HCL ER (XL) 150 MG PO TB24
150.0000 mg | ORAL_TABLET | Freq: Every morning | ORAL | 11 refills | Status: AC
  Filled 2023-11-25: qty 30, 30d supply, fill #0
  Filled 2023-12-31: qty 30, 30d supply, fill #1

## 2023-11-21 ENCOUNTER — Other Ambulatory Visit (HOSPITAL_COMMUNITY): Payer: Self-pay

## 2023-11-25 ENCOUNTER — Other Ambulatory Visit: Payer: Self-pay

## 2023-11-25 ENCOUNTER — Other Ambulatory Visit (HOSPITAL_COMMUNITY): Payer: Self-pay

## 2023-11-27 ENCOUNTER — Other Ambulatory Visit (HOSPITAL_COMMUNITY): Payer: Self-pay

## 2023-12-21 ENCOUNTER — Other Ambulatory Visit (HOSPITAL_COMMUNITY): Payer: Self-pay

## 2023-12-21 MED ORDER — AMPHETAMINE-DEXTROAMPHETAMINE 20 MG PO TABS
20.0000 mg | ORAL_TABLET | Freq: Three times a day (TID) | ORAL | 0 refills | Status: DC
  Filled 2023-12-21 – 2023-12-23 (×2): qty 90, 30d supply, fill #0

## 2023-12-22 ENCOUNTER — Other Ambulatory Visit (HOSPITAL_COMMUNITY): Payer: Self-pay

## 2023-12-23 ENCOUNTER — Other Ambulatory Visit (HOSPITAL_COMMUNITY): Payer: Self-pay

## 2023-12-23 ENCOUNTER — Other Ambulatory Visit: Payer: Self-pay

## 2023-12-23 ENCOUNTER — Ambulatory Visit: Admitting: Family Medicine

## 2023-12-28 ENCOUNTER — Encounter: Payer: Self-pay | Admitting: Radiology

## 2023-12-31 ENCOUNTER — Other Ambulatory Visit: Payer: Self-pay

## 2023-12-31 ENCOUNTER — Other Ambulatory Visit (HOSPITAL_COMMUNITY): Payer: Self-pay

## 2024-01-19 ENCOUNTER — Other Ambulatory Visit (HOSPITAL_COMMUNITY): Payer: Self-pay

## 2024-01-19 MED ORDER — AMPHETAMINE-DEXTROAMPHETAMINE 20 MG PO TABS
20.0000 mg | ORAL_TABLET | Freq: Three times a day (TID) | ORAL | 0 refills | Status: AC
  Filled 2024-01-22: qty 90, 30d supply, fill #0

## 2024-01-20 ENCOUNTER — Other Ambulatory Visit (HOSPITAL_COMMUNITY): Payer: Self-pay

## 2024-01-22 ENCOUNTER — Other Ambulatory Visit (HOSPITAL_COMMUNITY): Payer: Self-pay

## 2024-01-22 ENCOUNTER — Other Ambulatory Visit: Payer: Self-pay

## 2024-02-11 ENCOUNTER — Other Ambulatory Visit (HOSPITAL_COMMUNITY): Payer: Self-pay

## 2024-02-11 MED ORDER — AMPHETAMINE-DEXTROAMPHETAMINE 20 MG PO TABS
20.0000 mg | ORAL_TABLET | Freq: Three times a day (TID) | ORAL | 0 refills | Status: DC
  Filled 2024-03-01: qty 90, 30d supply, fill #0

## 2024-02-12 ENCOUNTER — Other Ambulatory Visit (HOSPITAL_COMMUNITY): Payer: Self-pay

## 2024-02-20 ENCOUNTER — Emergency Department (HOSPITAL_BASED_OUTPATIENT_CLINIC_OR_DEPARTMENT_OTHER)

## 2024-02-20 ENCOUNTER — Ambulatory Visit (HOSPITAL_BASED_OUTPATIENT_CLINIC_OR_DEPARTMENT_OTHER)
Admission: EM | Admit: 2024-02-20 | Discharge: 2024-02-21 | Disposition: A | Discharge status: Home / Self Care | Attending: Emergency Medicine | Admitting: Emergency Medicine

## 2024-02-20 ENCOUNTER — Other Ambulatory Visit: Payer: Self-pay

## 2024-02-20 DIAGNOSIS — R1010 Upper abdominal pain, unspecified: Secondary | ICD-10-CM

## 2024-02-20 DIAGNOSIS — J45909 Unspecified asthma, uncomplicated: Secondary | ICD-10-CM | POA: Insufficient documentation

## 2024-02-20 DIAGNOSIS — K219 Gastro-esophageal reflux disease without esophagitis: Secondary | ICD-10-CM | POA: Diagnosis not present

## 2024-02-20 DIAGNOSIS — F17200 Nicotine dependence, unspecified, uncomplicated: Secondary | ICD-10-CM | POA: Diagnosis not present

## 2024-02-20 DIAGNOSIS — K8012 Calculus of gallbladder with acute and chronic cholecystitis without obstruction: Secondary | ICD-10-CM | POA: Insufficient documentation

## 2024-02-20 LAB — COMPREHENSIVE METABOLIC PANEL WITH GFR
ALT: 125 U/L — ABNORMAL HIGH (ref 0–44)
AST: 63 U/L — ABNORMAL HIGH (ref 15–41)
Albumin: 4.9 g/dL (ref 3.5–5.0)
Alkaline Phosphatase: 62 U/L (ref 38–126)
Anion gap: 15 (ref 5–15)
BUN: 8 mg/dL (ref 6–20)
CO2: 22 mmol/L (ref 22–32)
Calcium: 9.3 mg/dL (ref 8.9–10.3)
Chloride: 100 mmol/L (ref 98–111)
Creatinine, Ser: 0.62 mg/dL (ref 0.44–1.00)
GFR, Estimated: >60 mL/min
Glucose, Bld: 124 mg/dL — ABNORMAL HIGH (ref 70–99)
Potassium: 3.7 mmol/L (ref 3.5–5.1)
Sodium: 136 mmol/L (ref 135–145)
Total Bilirubin: 0.4 mg/dL (ref 0.0–1.2)
Total Protein: 7.2 g/dL (ref 6.5–8.1)

## 2024-02-20 LAB — CBC
HCT: 37.4 % (ref 36.0–46.0)
Hemoglobin: 13.2 g/dL (ref 12.0–15.0)
MCH: 29.5 pg (ref 26.0–34.0)
MCHC: 35.3 g/dL (ref 30.0–36.0)
MCV: 83.7 fL (ref 80.0–100.0)
Platelets: 213 K/uL (ref 150–400)
RBC: 4.47 MIL/uL (ref 3.87–5.11)
RDW: 12.4 % (ref 11.5–15.5)
WBC: 7.6 K/uL (ref 4.0–10.5)
nRBC: 0 % (ref 0.0–0.2)

## 2024-02-20 LAB — HCG, SERUM, QUALITATIVE: Preg, Serum: NEGATIVE

## 2024-02-20 LAB — LIPASE, BLOOD: Lipase: 27 U/L (ref 11–51)

## 2024-02-20 MED ORDER — HYDROMORPHONE HCL 1 MG/ML IJ SOLN
1.0000 mg | Freq: Once | INTRAMUSCULAR | Status: AC
  Administered 2024-02-20: 1 mg via INTRAVENOUS
  Filled 2024-02-20: qty 1

## 2024-02-20 MED ORDER — ONDANSETRON HCL 4 MG/2ML IJ SOLN
4.0000 mg | Freq: Once | INTRAMUSCULAR | Status: AC
  Administered 2024-02-20: 4 mg via INTRAVENOUS
  Filled 2024-02-20: qty 2

## 2024-02-20 MED ORDER — SODIUM CHLORIDE 0.9 % IV BOLUS
1000.0000 mL | Freq: Once | INTRAVENOUS | Status: AC
  Administered 2024-02-21: 1000 mL via INTRAVENOUS

## 2024-02-20 MED ORDER — IOHEXOL 300 MG/ML  SOLN
100.0000 mL | Freq: Once | INTRAMUSCULAR | Status: AC | PRN
  Administered 2024-02-20: 100 mL via INTRAVENOUS

## 2024-02-20 MED ORDER — ONDANSETRON HCL 4 MG/2ML IJ SOLN: 4.0000 mg | Freq: Once | INTRAMUSCULAR | Status: DC | PRN

## 2024-02-20 NOTE — ED Provider Notes (Signed)
 Care of patient received from prior provider at 11:56 PM, please see their note for complete H/P and care plan.  Received handoff per ED course.  Clinical Course as of 02/21/24 0645  Sat Feb 20, 2024  2351 Stable  33 YOF with chief complaint of abdominal pain.  [CC]  Sun Feb 21, 2024  0310 ALT(!): 125 [JL]  0310 AST(!): 63 [JL]    Clinical Course User Index [CC] Jerral Meth, MD [JL] Jerrol Agent, MD    Reassessment: Care of this patient was received from the prior team.  33 year old female with very atypical abdominal pain.  Abrupt onset 10 out of 10 abdominal pain around 3 PM.  Care of patient received at midnight. CT abdomen pelvis showed no acute pathology.  Symptoms are remaining.  Urine studies nondiagnostic. Long conversation with patient family at bedside, patient is in severe discomfort not reacting to current medications.  May be a completed ovarian torsion though I would have expected to seen some component of this on CT, may be a radiographically silent cholecystitis given the mild transaminitis.  Considered mesenteric ischemia is possible given pain out of proportion to exam, will add on a lactic acid but would have expected to see clinically significant mesenteric ischemia on CT abdomen pelvis. If lactic acid is positive, would repeat study as an angio.  Not repeating the study immediately as the radiation load would be above the pretest probability of mesenteric ischemia in a patient with no immediate hypercoagulable risk factors.  She has no history of thromboembolic disease, does not take estrogen containing products per my asking, does not have A-fib.  If lactic acid is positive, that would change the pretest probability and would be worth repeating the study with arterially timed contrast.  Differential: Peptic ulcer disease is on the differential given severity of pain and longstanding history of gastroesophageal reflux disease and may be the most likely diagnosis  at the moment.  Ovarian torsion will be on the differential.  Unfortunately by time of patient's arrival it would have already been a completed torsion and therefore may be radiographically silent and require ultrasound to further differentiate  Additionally, radiographically silent cholecystitis/choledocholithiasis may be the etiology.  Mild transaminitis and diffuse abdominal pain may be related to [facility], however patient denied direct risk factors or exposures to STIs.   These conditions will require transfer to tertiary care center for further workup and management.    Jerral Meth, MD 02/21/24 [phone]

## 2024-02-20 NOTE — ED Provider Notes (Signed)
 " [place] [facility] Provider Note   CSN: [id number] Arrival date & time: 02/20/24  2054     Patient presents with: Abdominal Pain   Susan Boyle is a 33 y.o. female.   Patient complains of severe abdominal pain which began tonight.  Patient reports the pain is in the right and left upper abdomen.  Patient complains of severe pain she reports she has never had pain like this in the past.  Patient reports the pain began 2-1/2 hours ago.  She has not been able to get comfortable.  Patient denies any vomiting or diarrhea.  Patient denies any constipation.  Patient denies any risk of pregnancy she has an IUD.  Patient has past medical history of asthma and allergies.  Patient is allergic to Augmentin  and sulfa drugs.   The history is provided by the patient. No language interpreter was used.  Abdominal Pain Pain location:  RUQ and LUQ Pain quality: sharp and stabbing   Pain radiates to:  Does not radiate Pain severity:  Severe Onset quality:  Sudden Duration:  3 hours Timing:  Constant Progression:  Worsening Chronicity:  New Relieved by:  Nothing Associated symptoms: no dysuria        Prior to Admission medications  Medication Sig Start Date End Date Taking? Authorizing Provider  albuterol  (VENTOLIN  HFA) 108 (90 Base) MCG/ACT inhaler Inhale 2 puffs into the lungs every 4 (four) hours as needed for wheezing or shortness of breath (coughing fits). 01/21/23   Luke Orlan HERO, DO  aluminum  chloride (DRYSOL) 20 % external solution Apply topically at bedtime as needed. 09/14/23   Colette Torrence GRADE, MD  aluminum  chloride (DRYSOL) 20 % external solution Apply topically to affected areas at bedtime as needed. 09/14/23   Colette Torrence GRADE, MD  amphetamine -dextroamphetamine  (ADDERALL) 20 MG tablet Take 1 tablet (20 mg total) by mouth 3 (three) times daily in the morning, at 12 (noon), and 4 (four) pm. 04/30/23     amphetamine -dextroamphetamine  (ADDERALL) 20  MG tablet Take 1 tablet (20 mg total) by mouth 3 (three) times daily. 01/22/24     amphetamine -dextroamphetamine  (ADDERALL) 20 MG tablet Take 1 tablet (20 mg total) by mouth 3 (three) times daily. 02/21/24     buPROPion  (WELLBUTRIN  XL) 150 MG 24 hr tablet Take 1 tablet (150 mg total) by mouth every morning. 11/18/23     EPINEPHrine  (EPIPEN  2-PAK) 0.3 mg/0.3 mL IJ SOAJ injection Inject 0.3 mg into the muscle as needed for anaphylaxis. 12/31/22   Luke Orlan HERO, DO  fluticasone  (FLONASE ) 50 MCG/ACT nasal spray Place 1-2 sprays into both nostrils daily as needed (nasal congestion). 01/21/23   Luke Orlan HERO, DO  fluticasone -salmeterol (ADVAIR ) 250-50 MCG/ACT AEPB Inhale 1 puff into the lungs in the morning and at bedtime. 01/26/23   Luke Orlan HERO, DO  gabapentin  (NEURONTIN ) 300 MG capsule Take 1 capsule (300 mg total) by mouth 3 (three) times daily. Patient taking differently: Take 300 mg by mouth 3 (three) times daily. Taking 800MG  3 times daily 03/12/23     gabapentin  (NEURONTIN ) 300 MG capsule Take 1 capsule (300 mg total) by mouth 3 (three) times daily. 07/14/23     levonorgestrel (MIRENA) 20 MCG/24HR IUD 1 each by Intrauterine route once.    [provider]  nitrofurantoin  (MACRODANTIN ) 100 MG capsule Take 1 capsule (100 mg total) by mouth 2 (two) times daily. 09/11/23   Colette Torrence GRADE, MD  nitrofurantoin , macrocrystal-monohydrate, (MACROBID ) 100 MG capsule Take  1 capsule (100 mg total) by mouth 2 (two) times daily. 09/11/23   Melonie Locus, PA-C  pantoprazole  (PROTONIX ) 40 MG tablet Take 1 tablet (40 mg total) by mouth daily. 08/31/23   Colette Torrence GRADE, MD  PARoxetine  (PAXIL ) 20 MG tablet Take 1 tablet (20 mg total) by mouth at bedtime. 10/01/22     PARoxetine  (PAXIL ) 40 MG tablet Take 1 tablet (40 mg total) by mouth daily. 08/26/23     pregabalin (LYRICA) 75 MG capsule Take 75 mg by mouth. 08/10/23 08/09/24  [provider]  propranolol  (INDERAL ) 10 MG tablet Take 1 tablet (10 mg total) by  mouth 3 (three) times daily. 08/26/23     Vilazodone  HCl 20 MG TABS Take 1 tablet (20 mg total) by mouth daily with a full meal. 11/18/23       Allergies: Other, Augmentin  [amoxicillin -pot clavulanate], and Sulfamethoxazole    Review of Systems  Gastrointestinal:  Positive for abdominal pain.  Genitourinary:  Negative for dysuria.  All other systems reviewed and are negative.   Updated Vital Signs BP (!) 138/93 (BP Location: Right Arm)   Pulse (!) 55   Temp 98 F (36.7 C) (Oral)   Resp 17   Ht 5' 3 (1.6 m)   Wt 81.6 kg   SpO2 100%   BMI 31.87 kg/m   Physical Exam Vitals and nursing note reviewed.  Constitutional:      Appearance: She is well-developed.  HENT:     Head: Normocephalic.  Cardiovascular:     Rate and Rhythm: Normal rate.  Pulmonary:     Effort: Pulmonary effort is normal.  Abdominal:     General: Abdomen is flat. Bowel sounds are normal. There is no distension.     Palpations: Abdomen is soft.     Tenderness: There is abdominal tenderness in the right upper quadrant and left upper quadrant.     Hernia: No hernia is present.  Musculoskeletal:        General: Normal range of motion.     Cervical back: Normal range of motion.  Skin:    General: Skin is warm.  Neurological:     General: No focal deficit present.     Mental Status: She is alert and oriented to person, place, and time.  Psychiatric:        Mood and Affect: Mood normal.     (all labs ordered are listed, but only abnormal results are displayed) Labs Reviewed  COMPREHENSIVE METABOLIC PANEL WITH GFR - Abnormal; Notable for the following components:      Result Value   Glucose, Bld 124 (*)    AST 63 (*)    ALT 125 (*)    All other components within normal limits  LIPASE, BLOOD  CBC  HCG, SERUM, QUALITATIVE  URINALYSIS, ROUTINE W REFLEX MICROSCOPIC    EKG: None  Radiology: No results found.   Procedures   Medications Ordered in the ED  sodium chloride  0.9 % bolus 1,000 mL  (has no administration in time range)  HYDROmorphone  (DILAUDID ) injection 1 mg (1 mg Intravenous Given 02/20/24 2134)  ondansetron  (ZOFRAN ) injection 4 mg (4 mg Intravenous Given 02/20/24 2133)  HYDROmorphone  (DILAUDID ) injection 1 mg (1 mg Intravenous Given 02/20/24 2230)  iohexol  (OMNIPAQUE ) 300 MG/ML solution 100 mL (100 mLs Intravenous Contrast Given 02/20/24 2253)    Clinical Course as of 02/20/24 2359  Sat Feb 20, 2024  2351 Stable  33 YOF with chief complaint of abdominal pain.  Bizarre behavioural at  first  [CC]    Clinical Course User Index [CC] Jerral Meth, MD                                 Medical Decision Making Patient complains of severe abdominal pain.  Patient has not had any fever or chills.  Patient denies any constipation she denies any pregnancy risk she has an IUD.  Amount and/or Complexity of Data Reviewed Independent Historian: friend    Details: Patient has a friend who is here with her. Labs: ordered. Decision-making details documented in ED Course.    Details: Labs ordered reviewed and interpreted.  Shift has a normal white blood cell count.  Hemoglobin is normal at 13.2.  Metabolic panel shows a glucose of 124.  AST is 63.  ALT is 125. Radiology: ordered and independent interpretation performed. Decision-making details documented in ED Course.    Details: CT abdomen and pelvis with contrast ordered.  Risk Prescription drug management. Risk Details: Patient is given IV Dilaudid  1 mg.  Patient reports no relief from pain.  Patient is given a second dosage.  Patient returned from CT scan and reports that the pain is returning.        Final diagnoses:  Pain of upper abdomen    ED Discharge Orders     None     Pt's care turned over to Dr. Jerral.      Flint Sonny POUR, PA-C 02/21/24 TIANA Jerral Meth, MD 02/21/24 [phone]  "

## 2024-02-20 NOTE — ED Notes (Signed)
 Patient aware of necessity of urine sample.

## 2024-02-20 NOTE — ED Notes (Signed)
 Patient states that current pain level is tolerable, but it is beginning to creep up in severity. Provider notified. Pending new orders.

## 2024-02-20 NOTE — ED Notes (Signed)
 Patient requesting additional pain medication. Provider notified. Pending new orders.

## 2024-02-20 NOTE — ED Notes (Signed)
Patient transported to imaging at this time.

## 2024-02-20 NOTE — ED Triage Notes (Signed)
 Upper abdominal pain x 2.5 hours.  Pain is worsening.

## 2024-02-20 NOTE — ED Notes (Signed)
 Patient transferred from waiting room to ED treatment room. Assuming pt care at this time.

## 2024-02-20 NOTE — ED Provider Notes (Incomplete)
 " [facility] Provider Note   CSN: [id number] Arrival date & time: 02/20/24  2054     Patient presents with: Abdominal Pain   Susan Boyle is a 33 y.o. female.  {Add pertinent medical, surgical, social history, OB history to YEP:67052} Patient complains of severe abdominal pain which began tonight.  Patient reports the pain is in the right and left upper abdomen.  Patient complains of severe pain she reports she has never had pain like this in the past.  Patient reports the pain began 2-1/2 hours ago.  She has not been able to get comfortable.  Patient denies any vomiting or diarrhea.  Patient denies any constipation.  Patient denies any risk of pregnancy she has an IUD.  Patient has past medical history of asthma and allergies.  Patient is allergic to Augmentin  and sulfa drugs.   The history is provided by the patient. No language interpreter was used.  Abdominal Pain Pain location:  RUQ and LUQ Pain quality: sharp and stabbing   Pain radiates to:  Does not radiate Pain severity:  Severe Onset quality:  Sudden Duration:  3 hours Timing:  Constant Progression:  Worsening Chronicity:  New Relieved by:  Nothing Associated symptoms: no dysuria        Prior to Admission medications  Medication Sig Start Date End Date Taking? Authorizing Provider  albuterol  (VENTOLIN  HFA) 108 (90 Base) MCG/ACT inhaler Inhale 2 puffs into the lungs every 4 (four) hours as needed for wheezing or shortness of breath (coughing fits). 01/21/23   Luke Orlan HERO, DO  aluminum  chloride (DRYSOL) 20 % external solution Apply topically at bedtime as needed. 09/14/23   Colette Torrence GRADE, MD  aluminum  chloride (DRYSOL) 20 % external solution Apply topically to affected areas at bedtime as needed. 09/14/23   Colette Torrence GRADE, MD  amphetamine -dextroamphetamine  (ADDERALL) 20 MG tablet Take 1 tablet (20 mg total) by mouth 3 (three) times daily in the morning, at 12 (noon),  and 4 (four) pm. 04/30/23     amphetamine -dextroamphetamine  (ADDERALL) 20 MG tablet Take 1 tablet (20 mg total) by mouth 3 (three) times daily. 01/22/24     amphetamine -dextroamphetamine  (ADDERALL) 20 MG tablet Take 1 tablet (20 mg total) by mouth 3 (three) times daily. 02/21/24     buPROPion  (WELLBUTRIN  XL) 150 MG 24 hr tablet Take 1 tablet (150 mg total) by mouth every morning. 11/18/23     EPINEPHrine  (EPIPEN  2-PAK) 0.3 mg/0.3 mL IJ SOAJ injection Inject 0.3 mg into the muscle as needed for anaphylaxis. 12/31/22   Luke Orlan HERO, DO  fluticasone  (FLONASE ) 50 MCG/ACT nasal spray Place 1-2 sprays into both nostrils daily as needed (nasal congestion). 01/21/23   Luke Orlan HERO, DO  fluticasone -salmeterol (ADVAIR ) 250-50 MCG/ACT AEPB Inhale 1 puff into the lungs in the morning and at bedtime. 01/26/23   Luke Orlan HERO, DO  gabapentin  (NEURONTIN ) 300 MG capsule Take 1 capsule (300 mg total) by mouth 3 (three) times daily. Patient taking differently: Take 300 mg by mouth 3 (three) times daily. Taking 800MG  3 times daily 03/12/23     gabapentin  (NEURONTIN ) 300 MG capsule Take 1 capsule (300 mg total) by mouth 3 (three) times daily. 07/14/23     levonorgestrel (MIRENA) 20 MCG/24HR IUD 1 each by Intrauterine route once.    [provider]  nitrofurantoin  (MACRODANTIN ) 100 MG capsule Take 1 capsule (100 mg total) by mouth 2 (two) times daily. 09/11/23   Colette Torrence GRADE,  MD  nitrofurantoin , macrocrystal-monohydrate, (MACROBID ) 100 MG capsule Take 1 capsule (100 mg total) by mouth 2 (two) times daily. 09/11/23   Melonie Locus, PA-C  pantoprazole  (PROTONIX ) 40 MG tablet Take 1 tablet (40 mg total) by mouth daily. 08/31/23   Colette Torrence GRADE, MD  PARoxetine  (PAXIL ) 20 MG tablet Take 1 tablet (20 mg total) by mouth at bedtime. 10/01/22     PARoxetine  (PAXIL ) 40 MG tablet Take 1 tablet (40 mg total) by mouth daily. 08/26/23     pregabalin (LYRICA) 75 MG capsule Take 75 mg by mouth. 08/10/23 08/09/24  [provider]   propranolol  (INDERAL ) 10 MG tablet Take 1 tablet (10 mg total) by mouth 3 (three) times daily. 08/26/23     Vilazodone  HCl 20 MG TABS Take 1 tablet (20 mg total) by mouth daily with a full meal. 11/18/23       Allergies: Other, Augmentin  [amoxicillin -pot clavulanate], and Sulfamethoxazole    Review of Systems  Gastrointestinal:  Positive for abdominal pain.  Genitourinary:  Negative for dysuria.  All other systems reviewed and are negative.   Updated Vital Signs BP (!) 138/93 (BP Location: Right Arm)   Pulse (!) 55   Temp 98 F (36.7 C) (Oral)   Resp 17   Ht 5' 3 (1.6 m)   Wt 81.6 kg   SpO2 100%   BMI 31.87 kg/m   Physical Exam Vitals and nursing note reviewed.  Constitutional:      Appearance: She is well-developed.  HENT:     Head: Normocephalic.  Cardiovascular:     Rate and Rhythm: Normal rate.  Pulmonary:     Effort: Pulmonary effort is normal.  Abdominal:     General: Abdomen is flat. Bowel sounds are normal. There is no distension.     Palpations: Abdomen is soft.     Tenderness: There is abdominal tenderness in the right upper quadrant and left upper quadrant.     Hernia: No hernia is present.  Musculoskeletal:        General: Normal range of motion.     Cervical back: Normal range of motion.  Skin:    General: Skin is warm.  Neurological:     General: No focal deficit present.     Mental Status: She is alert and oriented to person, place, and time.  Psychiatric:        Mood and Affect: Mood normal.     (all labs ordered are listed, but only abnormal results are displayed) Labs Reviewed  COMPREHENSIVE METABOLIC PANEL WITH GFR - Abnormal; Notable for the following components:      Result Value   Glucose, Bld 124 (*)    AST 63 (*)    ALT 125 (*)    All other components within normal limits  LIPASE, BLOOD  CBC  HCG, SERUM, QUALITATIVE  URINALYSIS, ROUTINE W REFLEX MICROSCOPIC    EKG: None  Radiology: No results found.  {Document cardiac  monitor, telemetry assessment procedure when appropriate:32947} Procedures   Medications Ordered in the ED  sodium chloride  0.9 % bolus 1,000 mL (has no administration in time range)  HYDROmorphone  (DILAUDID ) injection 1 mg (1 mg Intravenous Given 02/20/24 2134)  ondansetron  (ZOFRAN ) injection 4 mg (4 mg Intravenous Given 02/20/24 2133)  HYDROmorphone  (DILAUDID ) injection 1 mg (1 mg Intravenous Given 02/20/24 2230)  iohexol  (OMNIPAQUE ) 300 MG/ML solution 100 mL (100 mLs Intravenous Contrast Given 02/20/24 2253)    Clinical Course as of 02/20/24 2359  Sat Feb 20, 2024  2351 Stable  33 YOF with chief complaint of abdominal pain.  Bizarre behavioural at first  [CC]    Clinical Course User Index [CC] Jerral Meth, MD   {Click here for ABCD2, HEART and other calculators REFRESH Note before signing:1}                              Medical Decision Making Patient complains of severe abdominal pain.  Patient has not had any fever or chills.  Patient denies any constipation she denies any pregnancy risk she has an IUD.  Amount and/or Complexity of Data Reviewed Independent Historian: friend    Details: Patient has a friend who is here with her. Labs: ordered. Decision-making details documented in ED Course. Radiology: ordered.  Risk Prescription drug management.   ***  {Document critical care time when appropriate  Document review of labs and clinical decision tools ie CHADS2VASC2, etc  Document your independent review of radiology images and any outside records  Document your discussion with family members, caretakers and with consultants  Document social determinants of health affecting pt's care  Document your decision making why or why not admission, treatments were needed:32947:::1}   Final diagnoses:  None    ED Discharge Orders     None        "

## 2024-02-21 ENCOUNTER — Encounter (HOSPITAL_COMMUNITY)
Admission: EM | Disposition: A | Discharge status: Home / Self Care | Payer: Self-pay | Source: Home / Self Care | Attending: Emergency Medicine

## 2024-02-21 ENCOUNTER — Emergency Department (HOSPITAL_COMMUNITY)

## 2024-02-21 ENCOUNTER — Encounter (HOSPITAL_COMMUNITY): Payer: Self-pay

## 2024-02-21 DIAGNOSIS — Z7401 Bed confinement status: Secondary | ICD-10-CM | POA: Diagnosis not present

## 2024-02-21 DIAGNOSIS — K8000 Calculus of gallbladder with acute cholecystitis without obstruction: Secondary | ICD-10-CM | POA: Diagnosis not present

## 2024-02-21 DIAGNOSIS — R1010 Upper abdominal pain, unspecified: Secondary | ICD-10-CM | POA: Diagnosis present

## 2024-02-21 DIAGNOSIS — R1084 Generalized abdominal pain: Secondary | ICD-10-CM | POA: Diagnosis not present

## 2024-02-21 HISTORY — PX: CHOLECYSTECTOMY: SHX55

## 2024-02-21 HISTORY — PX: INDOCYANINE GREEN FLUORESCENCE IMAGING (ICG): SHX7595

## 2024-02-21 LAB — URINALYSIS, ROUTINE W REFLEX MICROSCOPIC
Bilirubin Urine: NEGATIVE
Glucose, UA: NEGATIVE mg/dL
Hgb urine dipstick: NEGATIVE
Ketones, ur: NEGATIVE mg/dL
Leukocytes,Ua: NEGATIVE
Nitrite: NEGATIVE
Protein, ur: NEGATIVE mg/dL
Specific Gravity, Urine: 1.015 (ref 1.005–1.030)
pH: 6 (ref 5.0–8.0)

## 2024-02-21 LAB — LACTIC ACID, PLASMA
Lactic Acid, Venous: 2.5 mmol/L (ref 0.5–1.9)
Lactic Acid, Venous: 1.4 mmol/L (ref 0.5–1.9)

## 2024-02-21 SURGERY — LAPAROSCOPIC CHOLECYSTECTOMY
Anesthesia: General | Site: Abdomen

## 2024-02-21 MED ORDER — PROPOFOL 10 MG/ML IV BOLUS
INTRAVENOUS | Status: DC | PRN
  Administered 2024-02-21: 170 mg via INTRAVENOUS

## 2024-02-21 MED ORDER — ACETAMINOPHEN 650 MG RE SUPP: 650.0000 mg | RECTAL | Status: DC | PRN

## 2024-02-21 MED ORDER — DEXAMETHASONE SOD PHOSPHATE PF 10 MG/ML IJ SOLN
INTRAMUSCULAR | Status: DC | PRN
  Administered 2024-02-21: 10 mg via INTRAVENOUS

## 2024-02-21 MED ORDER — ACETAMINOPHEN 325 MG PO TABS: 650.0000 mg | ORAL_TABLET | ORAL | Status: DC | PRN

## 2024-02-21 MED ORDER — BUPIVACAINE-EPINEPHRINE (PF) 0.25% -1:200000 IJ SOLN
INTRAMUSCULAR | Status: AC
  Filled 2024-02-21: qty 30

## 2024-02-21 MED ORDER — SODIUM CHLORIDE 0.9 % IR SOLN
Status: DC | PRN
  Administered 2024-02-21: 1000 mL

## 2024-02-21 MED ORDER — KETOROLAC TROMETHAMINE 30 MG/ML IJ SOLN
INTRAMUSCULAR | Status: DC | PRN
  Administered 2024-02-21: 30 mg via INTRAVENOUS

## 2024-02-21 MED ORDER — PANTOPRAZOLE SODIUM 40 MG IV SOLR
40.0000 mg | Freq: Once | INTRAVENOUS | Status: AC
  Administered 2024-02-21: 40 mg via INTRAVENOUS
  Filled 2024-02-21: qty 10

## 2024-02-21 MED ORDER — ONDANSETRON HCL 4 MG/2ML IJ SOLN
4.0000 mg | Freq: Once | INTRAMUSCULAR | Status: AC
  Administered 2024-02-21: 4 mg via INTRAVENOUS
  Filled 2024-02-21: qty 2

## 2024-02-21 MED ORDER — LORAZEPAM 2 MG/ML IJ SOLN: 0.5000 mg | Freq: Once | INTRAMUSCULAR | Status: DC

## 2024-02-21 MED ORDER — ONDANSETRON HCL 4 MG/2ML IJ SOLN
INTRAMUSCULAR | Status: DC | PRN
  Administered 2024-02-21: 4 mg via INTRAVENOUS

## 2024-02-21 MED ORDER — HYDROMORPHONE HCL 1 MG/ML IJ SOLN
1.0000 mg | INTRAMUSCULAR | Status: DC | PRN
  Administered 2024-02-21 (×3): 1 mg via INTRAVENOUS
  Filled 2024-02-21 (×3): qty 1

## 2024-02-21 MED ORDER — ACETAMINOPHEN 10 MG/ML IV SOLN
INTRAVENOUS | Status: AC
  Filled 2024-02-21: qty 100

## 2024-02-21 MED ORDER — TRAMADOL HCL 50 MG PO TABS: 50.0000 mg | ORAL_TABLET | Freq: Four times a day (QID) | ORAL | 0 refills | Status: AC | PRN

## 2024-02-21 MED ORDER — 0.9 % SODIUM CHLORIDE (POUR BTL) OPTIME
TOPICAL | Status: DC | PRN
  Administered 2024-02-21: 1000 mL

## 2024-02-21 MED ORDER — KETOROLAC TROMETHAMINE 30 MG/ML IJ SOLN
INTRAMUSCULAR | Status: AC
  Filled 2024-02-21: qty 1

## 2024-02-21 MED ORDER — ONDANSETRON HCL 4 MG/2ML IJ SOLN
INTRAMUSCULAR | Status: AC
  Filled 2024-02-21: qty 2

## 2024-02-21 MED ORDER — LACTATED RINGERS IV SOLN: INTRAVENOUS | Status: DC

## 2024-02-21 MED ORDER — ROCURONIUM BROMIDE 10 MG/ML (PF) SYRINGE
PREFILLED_SYRINGE | INTRAVENOUS | Status: DC | PRN
  Administered 2024-02-21: 50 mg via INTRAVENOUS

## 2024-02-21 MED ORDER — BUPIVACAINE-EPINEPHRINE 0.25% -1:200000 IJ SOLN
INTRAMUSCULAR | Status: DC | PRN
  Administered 2024-02-21: 30 mL

## 2024-02-21 MED ORDER — INDOCYANINE GREEN 25 MG IJ SOLR
2.5000 mg | INTRAMUSCULAR | Status: AC
  Administered 2024-02-21: 2.5 mg via INTRAVENOUS
  Filled 2024-02-21: qty 1

## 2024-02-21 MED ORDER — DEXMEDETOMIDINE HCL IN NACL 80 MCG/20ML IV SOLN
INTRAVENOUS | Status: DC | PRN
  Administered 2024-02-21: 8 ug via INTRAVENOUS

## 2024-02-21 MED ORDER — CHLORHEXIDINE GLUCONATE 0.12 % MT SOLN
OROMUCOSAL | Status: AC
  Filled 2024-02-21: qty 15

## 2024-02-21 MED ORDER — OXYCODONE HCL 5 MG PO TABS: 5.0000 mg | ORAL_TABLET | ORAL | Status: DC | PRN

## 2024-02-21 MED ORDER — DOCUSATE SODIUM 100 MG PO CAPS: 100.0000 mg | ORAL_CAPSULE | Freq: Two times a day (BID) | ORAL | 0 refills | Status: AC

## 2024-02-21 MED ORDER — SUGAMMADEX SODIUM 200 MG/2ML IV SOLN
INTRAVENOUS | Status: DC | PRN
  Administered 2024-02-21: 200 mg via INTRAVENOUS

## 2024-02-21 MED ORDER — CIPROFLOXACIN IN D5W 400 MG/200ML IV SOLN
400.0000 mg | INTRAVENOUS | Status: AC
  Administered 2024-02-21: 400 mg via INTRAVENOUS

## 2024-02-21 MED ORDER — CHLORHEXIDINE GLUCONATE 0.12 % MT SOLN
15.0000 mL | Freq: Once | OROMUCOSAL | Status: AC
  Administered 2024-02-21: 15 mL via OROMUCOSAL

## 2024-02-21 MED ORDER — DROPERIDOL 2.5 MG/ML IJ SOLN: 0.6250 mg | Freq: Once | INTRAMUSCULAR | Status: DC | PRN

## 2024-02-21 MED ORDER — ACETAMINOPHEN 10 MG/ML IV SOLN
INTRAVENOUS | Status: DC | PRN
  Administered 2024-02-21: 1000 mg via INTRAVENOUS

## 2024-02-21 MED ORDER — CIPROFLOXACIN IN D5W 400 MG/200ML IV SOLN
INTRAVENOUS | Status: AC
  Filled 2024-02-21: qty 200

## 2024-02-21 MED ORDER — FENTANYL CITRATE (PF) 100 MCG/2ML IJ SOLN: 25.0000 ug | INTRAMUSCULAR | Status: DC | PRN

## 2024-02-21 MED ORDER — OXYCODONE HCL 5 MG/5ML PO SOLN: 5.0000 mg | Freq: Once | ORAL | Status: DC | PRN

## 2024-02-21 MED ORDER — FENTANYL CITRATE (PF) 250 MCG/5ML IJ SOLN
INTRAMUSCULAR | Status: AC
  Filled 2024-02-21: qty 5

## 2024-02-21 MED ORDER — ONDANSETRON 4 MG PO TBDP: 4.0000 mg | ORAL_TABLET | Freq: Three times a day (TID) | ORAL | 0 refills | Status: AC | PRN

## 2024-02-21 MED ORDER — ROCURONIUM BROMIDE 10 MG/ML (PF) SYRINGE
PREFILLED_SYRINGE | INTRAVENOUS | Status: AC
  Filled 2024-02-21: qty 10

## 2024-02-21 MED ORDER — MIDAZOLAM HCL (PF) 2 MG/2ML IJ SOLN
INTRAMUSCULAR | Status: DC | PRN
  Administered 2024-02-21: 2 mg via INTRAVENOUS

## 2024-02-21 MED ORDER — LIDOCAINE 2% (20 MG/ML) 5 ML SYRINGE
INTRAMUSCULAR | Status: AC
  Filled 2024-02-21: qty 5

## 2024-02-21 MED ORDER — MIDAZOLAM HCL 2 MG/2ML IJ SOLN
INTRAMUSCULAR | Status: AC
  Filled 2024-02-21: qty 2

## 2024-02-21 MED ORDER — LIDOCAINE 2% (20 MG/ML) 5 ML SYRINGE
INTRAMUSCULAR | Status: DC | PRN
  Administered 2024-02-21: 60 mg via INTRAVENOUS

## 2024-02-21 MED ORDER — PHENYLEPHRINE 80 MCG/ML (10ML) SYRINGE FOR IV PUSH (FOR BLOOD PRESSURE SUPPORT)
PREFILLED_SYRINGE | INTRAVENOUS | Status: AC
  Filled 2024-02-21: qty 10

## 2024-02-21 MED ORDER — ORAL CARE MOUTH RINSE: 15.0000 mL | Freq: Once | OROMUCOSAL | Status: AC

## 2024-02-21 MED ORDER — FENTANYL CITRATE (PF) 250 MCG/5ML IJ SOLN
INTRAMUSCULAR | Status: DC | PRN
  Administered 2024-02-21 (×4): 50 ug via INTRAVENOUS

## 2024-02-21 MED ORDER — DEXMEDETOMIDINE HCL IN NACL 80 MCG/20ML IV SOLN
INTRAVENOUS | Status: AC
  Filled 2024-02-21: qty 20

## 2024-02-21 MED ORDER — OXYCODONE HCL 5 MG PO TABS: 5.0000 mg | ORAL_TABLET | Freq: Once | ORAL | Status: DC | PRN

## 2024-02-21 MED ORDER — SUGAMMADEX SODIUM 200 MG/2ML IV SOLN
INTRAVENOUS | Status: AC
  Filled 2024-02-21: qty 2

## 2024-02-21 SURGICAL SUPPLY — 32 items
BAG COUNTER SPONGE SURGICOUNT (BAG) ×1 IMPLANT
BLADE CLIPPER SURG (BLADE) IMPLANT
CANISTER SUCTION 3000ML PPV (SUCTIONS) ×1 IMPLANT
CHLORAPREP W/TINT 26 (MISCELLANEOUS) ×1 IMPLANT
CLIP APPLIE ROT 10 11.4 M/L (STAPLE) ×1 IMPLANT
CNTNR URN SCR LID CUP LEK RST (MISCELLANEOUS) IMPLANT
COVER SURGICAL LIGHT HANDLE (MISCELLANEOUS) ×1 IMPLANT
DERMABOND ADVANCED .7 DNX12 (GAUZE/BANDAGES/DRESSINGS) ×1 IMPLANT
ELECTRODE REM PT RTRN 9FT ADLT (ELECTROSURGICAL) ×1 IMPLANT
GLOVE BIO SURGEON STRL SZ 6 (GLOVE) ×1 IMPLANT
GLOVE INDICATOR 6.5 STRL GRN (GLOVE) ×1 IMPLANT
GOWN STRL REUS W/ TWL LRG LVL3 (GOWN DISPOSABLE) ×3 IMPLANT
GRASPER SUT TROCAR 14GX15 (MISCELLANEOUS) ×1 IMPLANT
IRRIGATION SUCT STRKRFLW 2 WTP (MISCELLANEOUS) ×1 IMPLANT
KIT BASIN OR (CUSTOM PROCEDURE TRAY) ×1 IMPLANT
KIT IMAGING PINPOINTPAQ (MISCELLANEOUS) IMPLANT
KIT TURNOVER KIT B (KITS) ×1 IMPLANT
NEEDLE INSUFFLATION 14GA 120MM (NEEDLE) ×1 IMPLANT
SCISSORS LAP 5X35 DISP (ENDOMECHANICALS) ×1 IMPLANT
SET TUBE SMOKE EVAC HIGH FLOW (TUBING) ×1 IMPLANT
SLEEVE Z-THREAD 5X100MM (TROCAR) ×2 IMPLANT
SOLN 0.9% NACL POUR BTL 1000ML (IV SOLUTION) ×1 IMPLANT
SOLN STERILE WATER BTL 1000 ML (IV SOLUTION) ×1 IMPLANT
SUT MNCRL AB 4-0 PS2 18 (SUTURE) ×1 IMPLANT
SUT VICRYL 0 UR6 27IN ABS (SUTURE) IMPLANT
SYSTEM BAG RETRIEVAL 10MM (BASKET) ×1 IMPLANT
TOWEL GREEN STERILE (TOWEL DISPOSABLE) IMPLANT
TOWEL GREEN STERILE FF (TOWEL DISPOSABLE) ×1 IMPLANT
TRAY LAPAROSCOPIC MC (CUSTOM PROCEDURE TRAY) ×1 IMPLANT
TROCAR 11X100 Z THREAD (TROCAR) ×1 IMPLANT
TROCAR Z-THREAD OPTICAL 5X100M (TROCAR) ×1 IMPLANT
WARMER LAPAROSCOPE (MISCELLANEOUS) ×1 IMPLANT

## 2024-02-21 NOTE — Anesthesia Postprocedure Evaluation (Signed)
"   Anesthesia Post Note  Patient: Susan Boyle  Procedure(s) Performed: LAPAROSCOPIC CHOLECYSTECTOMY (Abdomen) INDOCYANINE GREEN  FLUORESCENCE IMAGING (ICG) (Abdomen)     Patient location during evaluation: PACU Anesthesia Type: General Level of consciousness: awake and alert Pain management: pain level controlled Vital Signs Assessment: post-procedure vital signs reviewed and stable Respiratory status: spontaneous breathing, nonlabored ventilation, respiratory function stable and patient connected to nasal cannula oxygen Cardiovascular status: blood pressure returned to baseline and stable Postop Assessment: no apparent nausea or vomiting Anesthetic complications: no   No notable events documented.  Last Vitals:  Vitals:   02/21/24 1800 02/21/24 1816  BP: 112/84 121/69  Pulse: 80 83  Resp: 17 14  Temp:    SpO2: 94% 95%    Last Pain:  Vitals:   02/21/24 1816  TempSrc:   PainSc: 0-No pain                 Susan Boyle      "

## 2024-02-21 NOTE — H&P (Addendum)
 "    Susan Boyle 1990/12/24  [id number].    Requesting MD: Dr. Thom Fetters Chief Complaint/Reason for Consult: Cholelithiasis   HPI: Susan Boyle is a 33 y.o. female with a history of anxiety and GERD who presented to the ED for abdominal pain.  Patient reports severe MEG and RUQ abdominal pain that began acutely last night.  Pain was not necessarily associated after p.o. intake.  No associated nausea or vomiting except for after getting her temperature checked.  No fever, urinary symptoms or changes in bowel habit.  She has had a BM in the last 24 hours that was nonbloody.  She notes intermittent similar symptoms in the past that she thought were just stomachaches and is unsure if they were related to p.o. intake.  She denies history of ulcers or frequent NSAID use.  She has never had an endoscopy and does not follow with a gastroenterologist.  She drinks 1-2 alcoholic beverages per week.  She did drink 1 alcoholic beverage yesterday.  No history of pancreatitis.  Patient with continued pain despite pain medication and general surgery was asked to see for possible symptomatic cholelithiasis.  She reports prior inguinal hernia repair as a child.  No other abdominal surgeries.  She is not on blood thinners.  She smokes e-cigarettes.  No drug use.  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 Last PO intake yesterday.   ROS: ROS As above, see hpi  Family History  Adopted: Yes  Problem Relation Age of Onset   Hyperlipidemia Father    Hypertension Father    Heart disease Father 74       MI   Obesity Father    Vision loss Maternal Grandmother    Cancer Maternal Grandfather    Vision loss Maternal Uncle     Past Medical History:  Diagnosis Date   Allergy    Seasonal. Taking allergy injections   Anxiety    Asthma    Back pain, chronic    Comminuted fracture    Left arm   Depression    Depression screen  12/10/2022   Elevated cholesterol with high triglycerides 11/26/2022   Gastroesophageal reflux disease 12/10/2022   Heartburn    Joint pain    Knee pain, chronic    Neuromuscular disorder (HCC) 02/18/2023   Radial nerve palsy   Radial nerve palsy, left    Shortness of breath     Past Surgical History:  Procedure Laterality Date   FRACTURE SURGERY  02/18/2023   HERNIA REPAIR     inguinal hernia repair as infant   NERVE, TENDON AND ARTERY REPAIR Left 07/20/2013   Procedure: NERVE,  REPAIR;  Surgeon: Arley JONELLE Curia, MD;  Location: [facility];  Service: Orthopedics;  Laterality: Left;   thumb surgery Left    Nerve repair    Social History:  reports that she has been smoking e-cigarettes. She has been exposed to tobacco smoke. She has never used smokeless tobacco. She reports current alcohol use of about 4.0 standard drinks of alcohol per week. She reports that she does not currently use drugs after having used the following drugs: Marijuana.  Allergies: Allergies[1]  (Not in a hospital admission)    Physical Exam: Blood pressure 132/86, pulse 87, temperature 98.1 F (36.7 C), temperature source Oral, resp. rate 18, height 5' 3 (1.6 m), weight 81.6 kg, SpO2 100%. General: pleasant, WD/WN female  who is laying in bed in NAD HEENT: head is normocephalic, atraumatic.  Sclera are non-icteric.  Heart: regular, rate, and rhythm.   Lungs:  Respiratory effort nonlabored Abd: Soft, ND, some LUQ, very ttp in the MEG and RUQ. No masses, hernias, or organomegaly MS: no BUE edema Skin: warm and dry  Psych: A&Ox4 with an appropriate affect Neuro: normal speech, thought process intact, gait not assessed  Results for orders placed or performed during the hospital encounter of 02/20/24 (from the past 48 hours)  Lipase, blood     Status: None   Collection Time: 02/20/24  9:24 PM  Result Value Ref Range   Lipase 27 11 - 51 U/L    Comment: Performed at [facility], [address] [place], [place] [address]  Comprehensive metabolic panel     Status: Abnormal   Collection Time: 02/20/24  9:24 PM  Result Value Ref Range   Sodium 136 135 - 145 mmol/L   Potassium 3.7 3.5 - 5.1 mmol/L   Chloride 100 98 - 111 mmol/L   CO2 22 22 - 32 mmol/L   Glucose, Bld 124 (H) 70 - 99 mg/dL    Comment: Glucose reference range applies only to samples taken after fasting for at least 8 hours.   BUN 8 6 - 20 mg/dL   Creatinine, Ser 9.37 0.44 - 1.00 mg/dL   Calcium 9.3 8.9 - 89.6 mg/dL   Total Protein 7.2 6.5 - 8.1 g/dL   Albumin 4.9 3.5 - 5.0 g/dL   AST 63 (H) 15 - 41 U/L   ALT 125 (H) 0 - 44 U/L   Alkaline Phosphatase 62 38 - 126 U/L   Total Bilirubin 0.4 0.0 - 1.2 mg/dL   GFR, Estimated >39 >39 mL/min    Comment: (NOTE) Calculated using the CKD-EPI Creatinine Equation (2021)    Anion gap 15 5 - 15    Comment: Performed at [facility], [address] [place], [place] [address]  CBC     Status: None   Collection Time: 02/20/24  9:24 PM  Result Value Ref Range   WBC 7.6 4.0 - 10.5 K/uL   RBC 4.47 3.87 - 5.11 MIL/uL   Hemoglobin 13.2 12.0 - 15.0 g/dL   HCT 62.5 63.9 - 53.9 %   MCV 83.7 80.0 - 100.0 fL   MCH 29.5 26.0 - 34.0 pg   MCHC 35.3 30.0 - 36.0 g/dL   RDW 87.5 88.4 - 84.4 %   Platelets 213 150 - 400 K/uL   nRBC 0.0 0.0 - 0.2 %    Comment: Performed at [facility], [address] [place], [place] [address]  hCG, serum, qualitative     Status: None   Collection Time: 02/20/24  9:24 PM  Result Value Ref Range   Preg, Serum NEGATIVE NEGATIVE    Comment:        THE SENSITIVITY OF THIS METHODOLOGY IS >10 mIU/mL. Performed at [facility], [address] [place], [place] [address]   Urinalysis, Routine w reflex microscopic -Urine, Clean Catch     Status: None   Collection Time: 02/21/24 12:43 AM  Result Value Ref Range   Color, Urine YELLOW YELLOW   APPearance CLEAR CLEAR   Specific Gravity, Urine 1.015 1.005 - 1.030    pH 6.0 5.0 - 8.0   Glucose, UA NEGATIVE NEGATIVE mg/dL   Hgb urine dipstick NEGATIVE NEGATIVE   Bilirubin Urine  NEGATIVE NEGATIVE   Ketones, ur NEGATIVE NEGATIVE mg/dL   Protein, ur NEGATIVE NEGATIVE mg/dL   Nitrite NEGATIVE NEGATIVE   Leukocytes,Ua NEGATIVE NEGATIVE    Comment: Microscopic not done on urines with negative protein, blood, leukocytes, nitrite, or glucose < 500 mg/dL. Performed at [facility], [address] [place], [place] [address]   Lactic acid, plasma     Status: Abnormal   Collection Time: 02/21/24  1:57 AM  Result Value Ref Range   Lactic Acid, Venous 2.5 (HH) 0.5 - 1.9 mmol/L    Comment: Critical Value, Read Back and verified with ALONSO PERRI OAS RN [phone] 02/21/2024 IVAR ANDREA Performed at [facility], [address] [place], [place] [address]   Blood culture (routine x 2)     Status: None (Preliminary result)   Collection Time: 02/21/24  1:57 AM   Specimen: BLOOD RIGHT HAND  Result Value Ref Range   Specimen Description      BLOOD RIGHT HAND Performed at [facility], [address] [place], [place] [address]    Special Requests      BOTTLES DRAWN AEROBIC AND ANAEROBIC Blood Culture adequate volume Performed at [facility], [address] [place], [place] [address]    Culture      NO GROWTH < 12 HOURS Performed at [facility], [address] [place], [place] [address]    Report Status PENDING   Blood culture (routine x 2)     Status: None (Preliminary result)   Collection Time: 02/21/24  1:57 AM   Specimen: BLOOD  Result Value Ref Range   Specimen Description      BLOOD RIGHT ANTECUBITAL Performed at [facility], [address] [place], [place] [address]    Special Requests      BOTTLES DRAWN AEROBIC AND ANAEROBIC Blood Culture adequate volume Performed at [facility], [address] [place], [place] [address]    Culture      NO GROWTH < 12 HOURS Performed at  [facility], [address] [place], [place] [address]    Report Status PENDING   Lactic acid, plasma     Status: None   Collection Time: 02/21/24  7:04 AM  Result Value Ref Range   Lactic Acid, Venous 1.4 0.5 - 1.9 mmol/L    Comment: Performed at [facility], [address], [place], [place] [address]   US  Abdomen Limited RUQ (LIVER/GB) Result Date: 02/21/2024 EXAM: Right Upper Quadrant Abdominal Ultrasound 02/21/2024 05:47:40 AM TECHNIQUE: Real-time ultrasonography of the right upper quadrant of the abdomen was performed. COMPARISON: CT abdomen and pelvis 02/20/2024. CLINICAL HISTORY: 33 year old female with right upper quadrant (RUQ) pain. FINDINGS: LIVER: Echogenic liver (image 29) in keeping with hepatic steatosis. No intrahepatic biliary ductal dilatation. No evidence of mass. Hepatopetal flow in the portal vein. BILIARY SYSTEM: Gallbladder wall thickness is normal measuring 2 mm. Numerous echogenic stones within the gallbladder (image 8). Individual stone size estimated up to 18 mm. No pericholecystic fluid. Common bile duct diameter is normal ranging from 3 to 4 mm. No sonographic Beverley sign is elicited. RIGHT KIDNEY: Partially visible, negative. OTHER: No right upper quadrant ascites. IMPRESSION: 1. Cholelithiasis (estimated up to 18 mm). No strong sonographic evidence of acute cholecystitis. 2. Hepatic steatosis. Electronically signed by: Helayne Hurst MD 02/21/2024 06:34 AM EST RP Workstation: [id number]   US  PELVIC TRANSABD W/PELVIC DOPPLER Result  Date: 02/21/2024 EXAM: US  Pelvis, Complete Transvaginal and Transabdominal with Doppler 02/21/2024 05:37:50 AM TECHNIQUE: Transabdominal pelvic duplex ultrasound using B-mode/gray scaled imaging with Doppler spectral analysis and color flow was obtained. No transvaginal imaging was performed. COMPARISON: CT abdomen and pelvis 02/20/2024. CLINICAL HISTORY: 33 year old female. Pelvic pain. Patient declined transvaginal imaging.  FINDINGS: UTERUS: Uterus measures 7.8 x 3.4 x 4.3 cm with an estimated volume of 60 ml. Uterus demonstrates normal myometrial echotexture. ENDOMETRIAL STRIPE: An IUD is visible, and its positioning appears stable and satisfactory (image 22). RIGHT OVARY: Right ovary measures 4.9 x 2.6 x 2.5 cm with an estimated volume of 17 ml. Right ovary is within normal limits. There is normal arterial and venous Doppler flow. Negative for ovarian torsion. LEFT OVARY: Left ovary measures 3.6 x 2.3 x 3.2 cm with an estimated volume of 14 ml. Left ovary is within normal limits. There is normal arterial and venous Doppler flow. Negative for ovarian torsion. OTHER: Unremarkable visible urinary bladder. No free fluid. IMPRESSION: 1. Negative for ovarian torsion. 2. Negative transabdominal pelvis ultrasound; IUD in place. Electronically signed by: Helayne Hurst MD 02/21/2024 06:31 AM EST RP Workstation: [id number]   CT ABDOMEN PELVIS W CONTRAST Result Date: 02/21/2024 EXAM: CT ABDOMEN AND PELVIS WITH CONTRAST 02/20/2024 10:50:00 PM TECHNIQUE: CT of the abdomen and pelvis was performed with the administration of 100 mL of iohexol  (OMNIPAQUE ) 300 MG/ML solution. Multiplanar reformatted images are provided for review. Automated exposure control, iterative reconstruction, and/or weight-based adjustment of the mA/kV was utilized to reduce the radiation dose to as low as reasonably achievable. COMPARISON: Findings compared to 03/05/2021. CLINICAL HISTORY: Acute upper abdominal pain. FINDINGS: LOWER CHEST: No acute abnormality. LIVER: Mild hepatic steatosis. Stable scattered hepatic hypodensities which are too small to accurately characterize but may represent tiny hepatic cysts or hamartoma. GALLBLADDER AND BILE DUCTS: Gallbladder is unremarkable. No biliary ductal dilatation. SPLEEN: No acute abnormality. PANCREAS: No acute abnormality. ADRENAL GLANDS: No acute abnormality. KIDNEYS, URETERS AND BLADDER: No stones in the kidneys or  ureters. No hydronephrosis. No perinephric or periureteral stranding. Urinary bladder is unremarkable. GI AND BOWEL: Stomach demonstrates no acute abnormality. Moderate pancolonic diverticulosis. No superimposed acute inflammatory change. The small bowel and large bowel are otherwise unremarkable. Appendix normal. There is no bowel obstruction. PERITONEUM AND RETROPERITONEUM: No ascites. No free air. VASCULATURE: Aorta is normal in caliber. LYMPH NODES: No lymphadenopathy. REPRODUCTIVE ORGANS: Intrauterine device is expected position within the uterus. The pelvic organs are otherwise unremarkable. BONES AND SOFT TISSUES: No acute osseous abnormality. No focal soft tissue abnormality. IMPRESSION: 1. No acute findings in the abdomen or pelvis. 2. Moderate pancolonic diverticulosis without evidence of diverticulitis. 3. Mild hepatic steatosis 4. Intrauterine device in expected position. Electronically signed by: Dorethia Molt MD 02/21/2024 12:01 AM EST RP Workstation: [id number]    Anti-infectives (From admission, onward)    None       Assessment/Plan Symptomatic Cholelithiasis, possible early Cholecystitis - Afebrile. No tachycardia or hypotension. - WBC wnl - LA 2.5 -> 1.4 - AST 63, ALT 125. Alk Phos and T. Bili wnl.  - Lipase wnl - Preg test neg, pelvic US  without evidence of torsion and otherwise neg. UA neg.  - Denies hx ulcers or frequent NSAID use - CT A/P with scattered hepatic hypodensities, moderate pancolonic diverticulosis without evidence of diverticulitis and otherwise no acute findings in the abdomen or pelvis.  - RUQ US  w/ Cholelithiasis (estimated up to 18 mm) without evidence of Cholecystitis - Patient with ongoing MEG and RUQ ttp  after pain medication here. Suspect symptomatic cholelithiasis with possible early acute cholecystitis. Recommended OR for laparoscopic Cholecystectomy. I have explained the procedure, risks, and aftercare of Laparoscopic cholecystectomy.  Risks include  but are not limited to anesthesia (MI, CVA, death, prolonged intubation and aspiration), bleeding, infection, wound problems, hernia, bile leak, injury to common bile duct/liver/intestine, possible need for subtotal cholecystectomy or open cholecystectomy, increased risk of DVT/PE and diarrhea post op.  She seems to understand and agrees to proceed. Keep NPO. Start IV abx. Possible d/c from PACU. If requires admission postoperatively, will plan admission to observation.   FEN - NPO VTE - SCDs ID - Cirpo Peri-op (PCN allergy)  I reviewed nursing notes, ED provider notes, last 24 h vitals and pain scores, last 48 h intake and output, last 24 h labs and trends, and last 24 h imaging results.   Ozell CHRISTELLA Shaper, [facility] Surgery 02/21/2024, 9:52 AM Please see Amion for pager number during day hours 7:00am-4:30pm     [1]  Allergies Allergen Reactions   Other Anaphylaxis    Campbell's Clam/Corn Chowder    Augmentin  [Amoxicillin -Pot Clavulanate] Diarrhea   Sulfamethoxazole Nausea And Vomiting   "

## 2024-02-21 NOTE — Anesthesia Procedure Notes (Signed)
 Procedure Name: Intubation Date/Time: 02/21/2024 4:04 PM  Performed by: Claudene Florina Boga, CRNAPre-anesthesia Checklist: Patient identified, Emergency Drugs available, Suction available and Patient being monitored Patient Re-evaluated:Patient Re-evaluated prior to induction Oxygen Delivery Method: Circle System Utilized Preoxygenation: Pre-oxygenation with 100% oxygen Induction Type: IV induction Ventilation: Mask ventilation without difficulty Laryngoscope Size: Mac and 3 Grade View: Grade I Tube type: Oral Tube size: 7.0 mm Number of attempts: 1 Airway Equipment and Method: Stylet Placement Confirmation: ETT inserted through vocal cords under direct vision, positive ETCO2 and breath sounds checked- equal and bilateral Secured at: 22 cm Tube secured with: Tape Dental Injury: Teeth and Oropharynx as per pre-operative assessment

## 2024-02-21 NOTE — ED Notes (Signed)
 [facility] called for transport.

## 2024-02-21 NOTE — Transfer of Care (Signed)
 Immediate Anesthesia Transfer of Care Note  Patient: Susan Boyle  Procedure(s) Performed: LAPAROSCOPIC CHOLECYSTECTOMY (Abdomen) INDOCYANINE GREEN  FLUORESCENCE IMAGING (ICG) (Abdomen)  Patient Location: PACU  Anesthesia Type:General  Level of Consciousness: drowsy and patient cooperative  Airway & Oxygen Therapy: Patient connected to face mask oxygen  Post-op Assessment: Report given to RN, Post -op Vital signs reviewed and stable, and Patient moving all extremities X 4  Post vital signs: Reviewed and stable  Last Vitals:  Vitals Value Taken Time  BP 122/71 1725  Temp    Pulse 85 1725  Resp 12 1725  SpO2 95 1725    Last Pain:  Vitals:   02/21/24 1448  TempSrc: Oral  PainSc: 1       Patients Stated Pain Goal: 3 (02/21/24 0000)  Complications: No notable events documented.

## 2024-02-21 NOTE — ED Provider Notes (Signed)
 Case discussed with Dr Signe.  Will see pt in consultation   Randol Simmonds, MD 02/21/24 [phone]

## 2024-02-21 NOTE — Op Note (Signed)
 Operative Note  Susan Boyle 33 y.o. female [id number]  02/21/2024  Surgeon: Mitzie DELENA Freund MD FACS  Procedure performed: Laparoscopic Cholecystectomy  Procedure classification: emergent  Preop diagnosis: symptomatic cholelithiasis Post-op diagnosis/intraop findings:acute cholecystitis  Specimens: gallbladder  Retained items: none  EBL: minimal  Complications: none  Description of procedure: After confirming informed consent the patient was brought to the operating room. Antibiotics were administered. SCD's were applied. General endotracheal anesthesia was initiated and a formal time-out was performed. The abdomen was prepped and draped in the usual sterile fashion and the abdomen was entered using an infraumbilical veress needle after instilling the site with local. Insufflation to was obtained, 5mm trocar and camera inserted using optical entry in the right upper quadrant, and gross inspection revealed no evidence of injury from our entry.  The liver was noted to be significantly enlarged and steatotic appearing.   Two 5mm trocars were introduced in the infraumbilical and right anterior axillary lines under direct visualization and following infiltration with local. An 11mm trocar was placed in the epigastrium. The gallbladder was tensely distended, edematous and appeared acutely inflamed.  The gallbladder was decompressed with send the shot in order to allow it to be grasped. The gallbladder fundus was retracted cephalad and the infundibulum was retracted laterally. A combination of hook electrocautery and blunt dissection was utilized to clear the peritoneum from the neck and cystic duct, circumferentially isolating the cystic artery and cystic duct and lifting the gallbladder from the cystic plate. The critical view of safety was achieved with the cystic artery, cystic duct, and liver bed visualized between them with no other structures. The cystic artery was clipped with a  single clip proximally and distally and divided as was the cystic duct with three clips on the proximal end. The gallbladder was dissected from the liver plate using electrocautery. Once freed the gallbladder was placed in an endocatch bag and removed through the epigastric trocar site. The right upper quadrant was irrigated with warm sterile saline; the effluent was clear. Hemostasis was once again confirmed, and reinspection of the abdomen revealed no injuries. The clips were well apposed without any bile leak from the ligated remnant cystic duct or the liver bed. The 11mm trocar site in the epigastrium was closed with a 0 vicryl in the fascia under direct visualization using a PMI device. The abdomen was desufflated and all trocars removed. The skin incisions were closed with subcuticular 4-0 monocryl and Dermabond. The patient was awakened, extubated and transported to the recovery room in stable condition.    All counts were correct at the completion of the case.

## 2024-02-21 NOTE — ED Notes (Signed)
 Pt in bed, sig other at bedside, pt states that her pain is much better, pt has no requests at this time.  Resps even and unlabored

## 2024-02-21 NOTE — Progress Notes (Signed)
.. ° ° °  PROCEDURAL EXPEDITER PROGRESS NOTE  Patient Name: Susan Boyle  DOB:08/08/1990 Date of Admission: 02/20/2024  Date of Assessment:02/21/2024   -------------------------------------------------------------------------------------------------------------------   Brief clinical summary: Pt to OR today for Laparoscopic Cholecystectomy  Orders in place:  Yes   Labs, test, and orders reviewed: Y  Requires surgical clearance:  No  Barriers noted: N/A   -------------------------------------------------------------------------------------------------------------------  [facility], [place], [place] Please contact us  directly via secure chat (search for [facility]) or by calling us  at [phone] [facility]).

## 2024-02-21 NOTE — ED Triage Notes (Signed)
 Pt coming via [facility] from [facility] c/o of abdominal pain since last night around 1900. CT scans clear at [facility], here for MRI scans. Pain 5/10 now.   Received 1 mg Dilaudid   18 G RAC  22 G R hand

## 2024-02-21 NOTE — Anesthesia Preprocedure Evaluation (Signed)
"                                    Anesthesia Evaluation  Patient identified by MRN, date of birth, ID band Patient awake    Reviewed: Allergy & Precautions, NPO status , Patient's Chart, lab work & pertinent test results  Airway Mallampati: II  TM Distance: >3 FB Neck ROM: Full    Dental  (+) Dental Advisory Given   Pulmonary shortness of breath, asthma , Current Smoker   breath sounds clear to auscultation       Cardiovascular negative cardio ROS  Rhythm:Regular Rate:Normal     Neuro/Psych  Neuromuscular disease    GI/Hepatic Neg liver ROS,GERD  ,,  Endo/Other  negative endocrine ROS    Renal/GU negative Renal ROS     Musculoskeletal   Abdominal   Peds  Hematology negative hematology ROS (+)   Anesthesia Other Findings   Reproductive/Obstetrics                              Anesthesia Physical Anesthesia Plan  ASA: 2  Anesthesia Plan: General   Post-op Pain Management: Ofirmev  IV (intra-op)* and Toradol  IV (intra-op)*   Induction: Intravenous  PONV Risk Score and Plan: 2 and Dexamethasone , Ondansetron , Treatment may vary due to age or medical condition and Midazolam   Airway Management Planned: Oral ETT  Additional Equipment:   Intra-op Plan:   Post-operative Plan: Extubation in OR  Informed Consent: I have reviewed the patients History and Physical, chart, labs and discussed the procedure including the risks, benefits and alternatives for the proposed anesthesia with the patient or authorized representative who has indicated his/her understanding and acceptance.     Dental advisory given  Plan Discussed with: CRNA  Anesthesia Plan Comments:         Anesthesia Quick Evaluation  "

## 2024-02-21 NOTE — ED Notes (Signed)
 Reminded patient of necessity of urine sample.

## 2024-02-21 NOTE — ED Provider Notes (Signed)
" °  Physical Exam  BP (!) 133/96   Pulse (!) 56   Temp 98 F (36.7 C) (Oral)   Resp 16   Ht 5' 3 (1.6 m)   Wt 81.6 kg   SpO2 98%   BMI 31.87 kg/m   Physical Exam  Procedures  Procedures  ED Course / MDM   Clinical Course as of 02/21/24 0312  Sat Feb 20, 2024  2351 Stable  33 YOF with chief complaint of abdominal pain.  [CC]  Sun Feb 21, 2024  0310 ALT(!): 125 [JL]  0310 AST(!): 63 [JL]    Clinical Course User Index [CC] Jerral Meth, MD [JL] Jerrol Agent, MD   Medical Decision Making Amount and/or Complexity of Data Reviewed Labs: ordered. Decision-making details documented in ED Course. Radiology: ordered.  Risk Prescription drug management.   Patient presenting in transfer from [facility] with severe abdominal pain.  Has elevated LFTs, ALT 125, AST 63, lactic mildly elevated, getting pain medicine and fluids.  CT abdomen pelvis did not show any acute abnormality.  Patient pending right upper quadrant ultrasound and ultrasound to evaluate for torsion.  CT Abd Pel: IMPRESSION:  1. No acute findings in the abdomen or pelvis.  2. Moderate pancolonic diverticulosis without evidence of diverticulitis.  3. Mild hepatic steatosis  4. Intrauterine device in expected position.    Ultrasound torsion: IMPRESSION:  1. Negative for ovarian torsion.  2. Negative transabdominal pelvis ultrasound; IUD in place.   RUQ US : IMPRESSION:  1. Cholelithiasis (estimated up to 18 mm). No strong sonographic evidence of  acute cholecystitis.  2. Hepatic steatosis.    With concern for symptomatic cholelithiasis, will consult general surgery for further recommendations.  General surgery consult: pending at time of signout.  Signout given to Dr. Randol at 0730.     Jerrol Agent, MD 02/21/24 [phone]  "

## 2024-02-21 NOTE — Discharge Instructions (Signed)

## 2024-02-22 ENCOUNTER — Encounter (HOSPITAL_COMMUNITY): Payer: Self-pay | Admitting: Surgery

## 2024-02-23 ENCOUNTER — Ambulatory Visit
Admission: EM | Admit: 2024-02-23 | Discharge: 2024-02-23 | Disposition: A | Discharge status: Home / Self Care | Attending: Emergency Medicine | Admitting: Emergency Medicine

## 2024-02-23 ENCOUNTER — Encounter: Payer: Self-pay | Admitting: Emergency Medicine

## 2024-02-23 ENCOUNTER — Telehealth: Admitting: Physician Assistant

## 2024-02-23 ENCOUNTER — Telehealth

## 2024-02-23 DIAGNOSIS — R6889 Other general symptoms and signs: Secondary | ICD-10-CM

## 2024-02-23 DIAGNOSIS — Z9889 Other specified postprocedural states: Secondary | ICD-10-CM

## 2024-02-23 DIAGNOSIS — R070 Pain in throat: Secondary | ICD-10-CM

## 2024-02-23 DIAGNOSIS — R509 Fever, unspecified: Secondary | ICD-10-CM

## 2024-02-23 LAB — POCT INFLUENZA A/B
Influenza A, POC: NEGATIVE
Influenza B, POC: NEGATIVE

## 2024-02-23 LAB — SURGICAL PATHOLOGY

## 2024-02-23 NOTE — ED Provider Notes (Signed)
 Susan Boyle    CSN: [id number] Arrival date & time: 02/23/24  1554    HISTORY   Chief Complaint  Patient presents with   Cough   Sore Throat   Fever   Generalized Body Aches   HPI Susan Boyle is a pleasant, 33 y.o. female who presents to urgent care today. Pt c/o subjective fever, sore throat, non productive cough, body aches x 1 day.  States had surgery 2 days ago and believes she may have been exposed to flu in the ER.  Has been taking tramadol  and ibuprofen . Pt has normal VS on arrival. Denies nasal congestion, rhinorrhea, otalgia, chills, n/v/d.   The history is provided by the patient.  Cough Associated symptoms: fever   Sore Throat  Fever Associated symptoms: cough    Past Medical History:  Diagnosis Date   Allergy    Seasonal. Taking allergy injections   Anxiety    Asthma    Back pain, chronic    Comminuted fracture    Left arm   Depression    Depression screen 12/10/2022   Elevated cholesterol with high triglycerides 11/26/2022   Gastroesophageal reflux disease 12/10/2022   Heartburn    Joint pain    Knee pain, chronic    Neuromuscular disorder (HCC) 02/18/2023   Radial nerve palsy   Radial nerve palsy, left    Shortness of breath    Patient Active Problem List   Diagnosis Date Noted   Radial tunnel syndrome, right 07/15/2023   Neuropathic pain 06/18/2023   Radial nerve palsy, left 04/02/2023   Decreased activities of daily living (ADL) 03/25/2023   Insulin  resistance 03/25/2023   Decreased range of motion of left elbow 02/26/2023   Decreased range of motion of left shoulder 02/26/2023   Left supracondylar humerus fracture, closed, initial encounter 02/19/2023   Closed displaced comminuted fracture of shaft of left humerus 02/18/2023   Other Specified Feeding or Eating Disorder, Emotional Eating Behaviors 01/26/2023   Vitamin D  deficiency 12/30/2022   SOBOE (shortness of breath on exertion) 12/10/2022   Gastroesophageal reflux  disease 12/10/2022   Depression screen 12/10/2022   BMI 31.0-31.9,adult 12/10/2022   Generalized obesity 12/10/2022   Anxiety and depression 11/26/2022   Class 1 obesity due to excess calories with body mass index (BMI) of 31.0 to 31.9 in adult 11/26/2022   Elevated cholesterol with high triglycerides 11/26/2022   Attention deficit hyperactivity disorder (ADHD) 09/17/2022   IUD contraception 09/17/2022   Seasonal and perennial allergic rhinoconjunctivitis 11/05/2020   Allergic reaction 07/04/2020   Food allergy 05/02/2020   Environmental allergies 05/02/2020   Asthma    Past Surgical History:  Procedure Laterality Date   CHOLECYSTECTOMY N/A 02/21/2024   Procedure: LAPAROSCOPIC CHOLECYSTECTOMY;  Surgeon: Signe Mitzie LABOR, MD;  Location: [facility] OR;  Service: General;  Laterality: N/A;   FRACTURE SURGERY  02/18/2023   HERNIA REPAIR     inguinal hernia repair as infant   INDOCYANINE GREEN  FLUORESCENCE IMAGING (ICG) N/A 02/21/2024   Procedure: INDOCYANINE GREEN  FLUORESCENCE IMAGING (ICG);  Surgeon: Signe Mitzie LABOR, MD;  [facility];  Service: General;  Laterality: N/A;   NERVE, TENDON AND ARTERY REPAIR Left 07/20/2013   Procedure: NERVE,  REPAIR;  Surgeon: Arley JONELLE Curia, MD;  Location: [facility];  Service: Orthopedics;  Laterality: Left;   thumb surgery Left    Nerve repair   OB History     Gravida  0   Para  0  Term  0   Preterm  0   AB  0   Living  0      SAB  0   IAB  0   Ectopic  0   Multiple  0   Live Births             Home Medications    Prior to Admission medications  Medication Sig Start Date End Date Taking? Authorizing Provider  albuterol  (VENTOLIN  HFA) 108 (90 Base) MCG/ACT inhaler Inhale 2 puffs into the lungs every 4 (four) hours as needed for wheezing or shortness of breath (coughing fits). 01/21/23   Luke Orlan HERO, DO  aluminum  chloride (DRYSOL) 20 % external solution Apply topically at bedtime as needed. 09/14/23   Colette Torrence GRADE, MD  aluminum  chloride (DRYSOL) 20 % external solution Apply topically to affected areas at bedtime as needed. 09/14/23   Colette Torrence GRADE, MD  amphetamine -dextroamphetamine  (ADDERALL) 20 MG tablet Take 1 tablet (20 mg total) by mouth 3 (three) times daily in the morning, at 12 (noon), and 4 (four) pm. 04/30/23     amphetamine -dextroamphetamine  (ADDERALL) 20 MG tablet Take 1 tablet (20 mg total) by mouth 3 (three) times daily. 01/22/24     amphetamine -dextroamphetamine  (ADDERALL) 20 MG tablet Take 1 tablet (20 mg total) by mouth 3 (three) times daily. 02/21/24     buPROPion  (WELLBUTRIN  XL) 150 MG 24 hr tablet Take 1 tablet (150 mg total) by mouth every morning. 11/18/23     docusate sodium  (COLACE) 100 MG capsule Take 1 capsule (100 mg total) by mouth 2 (two) times daily. Okay to decrease to once daily or stop taking if having loose bowel movements 02/21/24 03/22/24  Signe Mitzie LABOR, MD  EPINEPHrine  (EPIPEN  2-PAK) 0.3 mg/0.3 mL IJ SOAJ injection Inject 0.3 mg into the muscle as needed for anaphylaxis. 12/31/22   Luke Orlan HERO, DO  fluticasone  (FLONASE ) 50 MCG/ACT nasal spray Place 1-2 sprays into both nostrils daily as needed (nasal congestion). 01/21/23   Luke Orlan HERO, DO  fluticasone -salmeterol (ADVAIR ) 250-50 MCG/ACT AEPB Inhale 1 puff into the lungs in the morning and at bedtime. 01/26/23   Luke Orlan HERO, DO  gabapentin  (NEURONTIN ) 300 MG capsule Take 1 capsule (300 mg total) by mouth 3 (three) times daily. Patient taking differently: Take 300 mg by mouth 3 (three) times daily. Taking 800MG  3 times daily 03/12/23     gabapentin  (NEURONTIN ) 300 MG capsule Take 1 capsule (300 mg total) by mouth 3 (three) times daily. 07/14/23     levonorgestrel (MIRENA) 20 MCG/24HR IUD 1 each by Intrauterine route once.    [provider]  nitrofurantoin  (MACRODANTIN ) 100 MG capsule Take 1 capsule (100 mg total) by mouth 2 (two) times daily. 09/11/23   Rucker, Torrence GRADE, MD  nitrofurantoin ,  macrocrystal-monohydrate, (MACROBID ) 100 MG capsule Take 1 capsule (100 mg total) by mouth 2 (two) times daily. 09/11/23   Melonie Locus, PA-C  ondansetron  (ZOFRAN -ODT) 4 MG disintegrating tablet Take 1 tablet (4 mg total) by mouth every 8 (eight) hours as needed for nausea or vomiting. 02/21/24   Signe Mitzie LABOR, MD  pantoprazole  (PROTONIX ) 40 MG tablet Take 1 tablet (40 mg total) by mouth daily. 08/31/23   Colette Torrence GRADE, MD  PARoxetine  (PAXIL ) 20 MG tablet Take 1 tablet (20 mg total) by mouth at bedtime. 10/01/22     PARoxetine  (PAXIL ) 40 MG tablet Take 1 tablet (40 mg total) by mouth daily. 08/26/23     pregabalin (LYRICA)  75 MG capsule Take 75 mg by mouth. 08/10/23 08/09/24  [provider]  propranolol  (INDERAL ) 10 MG tablet Take 1 tablet (10 mg total) by mouth 3 (three) times daily. 08/26/23     traMADol  (ULTRAM ) 50 MG tablet Take 1 tablet (50 mg total) by mouth every 6 (six) hours as needed for up to 5 days (Pain not controlled by Tylenol , ibuprofen , ice, rest etc.). 02/21/24 02/26/24  Signe Mitzie LABOR, MD  Vilazodone  HCl 20 MG TABS Take 1 tablet (20 mg total) by mouth daily with a full meal. 11/18/23       Family History Family History  Adopted: Yes  Problem Relation Age of Onset   Hyperlipidemia Father    Hypertension Father    Heart disease Father 64       MI   Obesity Father    Vision loss Maternal Grandmother    Cancer Maternal Grandfather    Vision loss Maternal Uncle    Social History Social History[1] Allergies   Other, Augmentin  [amoxicillin -pot clavulanate], and Sulfamethoxazole  Review of Systems Review of Systems  Constitutional:  Positive for fever.  Respiratory:  Positive for cough.    Pertinent findings revealed after performing a 14 point review of systems has been noted in the history of present illness.  Physical Exam Vital Signs BP 112/74 (BP Location: Right Arm)   Pulse 80   Temp 97.9 F (36.6 C) (Oral)   Resp 16   SpO2 94%   No data  found.  Physical Exam Vitals and nursing note reviewed.  Constitutional:      General: She is awake. She is not in acute distress.    Appearance: Normal appearance. She is well-developed and well-groomed. She is not ill-appearing.  HENT:     Head: Normocephalic and atraumatic.     Salivary Glands: Right salivary gland is not diffusely enlarged or tender. Left salivary gland is not diffusely enlarged or tender.     Right Ear: Hearing, tympanic membrane, ear canal and external ear normal.     Left Ear: Hearing, tympanic membrane, ear canal and external ear normal.     Nose: Nose normal.     Right Turbinates: Not enlarged, swollen or pale.     Left Turbinates: Not enlarged, swollen or pale.     Right Sinus: No maxillary sinus tenderness or frontal sinus tenderness.     Left Sinus: No maxillary sinus tenderness or frontal sinus tenderness.     Mouth/Throat:     Lips: Pink. No lesions.     Mouth: Mucous membranes are moist. No oral lesions.     Tongue: No lesions. Tongue does not deviate from midline.     Palate: No mass and lesions.     Pharynx: Oropharynx is clear. Uvula midline. No pharyngeal swelling, oropharyngeal exudate, posterior oropharyngeal erythema, uvula swelling or postnasal drip.     Tonsils: No tonsillar exudate. 0 on the right. 0 on the left.  Eyes:     General: Lids are normal.        Right eye: No discharge.        Left eye: No discharge.     Conjunctiva/sclera: Conjunctivae normal.     Right eye: Right conjunctiva is not injected.     Left eye: Left conjunctiva is not injected.  Neck:     Trachea: Trachea and phonation normal.  Cardiovascular:     Rate and Rhythm: Normal rate and regular rhythm.  Pulmonary:     Effort: Pulmonary effort  is normal.     Breath sounds: Normal breath sounds.  Chest:     Chest wall: No tenderness.  Musculoskeletal:        General: Normal range of motion.     Cervical back: Full passive range of motion without pain, normal range of  motion and neck supple. Normal range of motion.  Lymphadenopathy:     Cervical: No cervical adenopathy.  Skin:    General: Skin is warm and dry.     Findings: No erythema or rash.  Neurological:     General: No focal deficit present.     Mental Status: She is alert and oriented to person, place, and time. Mental status is at baseline.  Psychiatric:        Attention and Perception: Attention and perception normal.        Mood and Affect: Mood and affect normal.        Speech: Speech normal.        Behavior: Behavior normal. Behavior is cooperative.        Thought Content: Thought content normal.     Visual Acuity Right Eye Distance:   Left Eye Distance:   Bilateral Distance:    Right Eye Near:   Left Eye Near:    Bilateral Near:     Boyle Couse / Diagnostics / Procedures:     Radiology No results found.  Procedures Procedures (including critical care time) EKG  Pending results:  Labs Reviewed  POCT INFLUENZA A/B - Normal    Medications Ordered in Boyle: Medications - No data to display  Boyle Diagnoses / Final Clinical Impressions(s)   I have reviewed the triage vital signs and the nursing notes.  Pertinent labs & imaging results that were available during my care of the patient were reviewed by me and considered in my medical decision making (see chart for details).    Final diagnoses:  Feeling unwell  Post-operative state  Throat pain in adult   Patient advised of negative flu test.  Patient states she took a home COVID-19 test today which was negative.  Patient advised all of her symptoms can be attributed to her being 2 days postop from a laparoscopic gallbladder removal.  Patient advised to follow OR/ED discharge instructions, follow-up with surgeon or PCP as needed.  Please see discharge instructions below for details of plan of care as provided to patient. ED Prescriptions   None    PDMP not reviewed this encounter.    Discharge Instructions      Your  flu test today was negative.  All of your symptoms can be attributed to your having had surgery 2 days ago.  Please continue your pain medicine as prescribed.  I anticipate you will be feeling better in the next 5 to 7 days.      Disposition Upon Discharge:  Condition: stable for discharge home  Patient presented with an acute illness with associated systemic symptoms and significant discomfort requiring urgent management. In my opinion, this is a condition that a prudent lay person (someone who possesses an average knowledge of health and medicine) may potentially expect to result in complications if not addressed urgently such as respiratory distress, impairment of bodily function or dysfunction of bodily organs.   Routine symptom specific, illness specific and/or disease specific instructions were discussed with the patient and/or caregiver at length.   As such, the patient has been evaluated and assessed, work-up was performed and treatment was provided in alignment with urgent care  protocols and evidence based medicine.  Patient/parent/caregiver has been advised that the patient may require follow up for further testing and treatment if the symptoms continue in spite of treatment, as clinically indicated and appropriate.  Patient/parent/caregiver has been advised to return to the [facility] or PCP if no better; to PCP or the Emergency Department if new signs and symptoms develop, or if the current signs or symptoms continue to change or worsen for further workup, evaluation and treatment as clinically indicated and appropriate  The patient will follow up with their current PCP if and as advised. If the patient does not currently have a PCP we will assist them in obtaining one.   The patient may need specialty follow up if the symptoms continue, in spite of conservative treatment and management, for further workup, evaluation, consultation and treatment as clinically indicated and  appropriate.  Patient/parent/caregiver verbalized understanding and agreement of plan as discussed.  All questions were addressed during visit.  Please see discharge instructions below for further details of plan.  This office note has been dictated using Teaching laboratory technician.  Unfortunately, this method of dictation can sometimes lead to typographical or grammatical errors.  I apologize for your inconvenience in advance if this occurs.  Please do not hesitate to reach out to me if clarification is needed.       [1]  Social History Tobacco Use   Smoking status: Every Day    Types: E-cigarettes    Passive exposure: Current   Smokeless tobacco: Never   Tobacco comments:    stopped in November 2021   Vaping Use   Vaping status: Former   Substances: Nicotine   Devices: Current: Nicotine patch  Substance Use Topics   Alcohol use: Yes    Alcohol/week: 4.0 standard drinks of alcohol    Types: 4 Glasses of wine per week    Comment: social   Drug use: Not Currently    Types: Marijuana     Joesph Shaver [place], [place] 02/23/24 1639  "

## 2024-02-23 NOTE — ED Triage Notes (Signed)
 Pt reports a fever, sore throat, cough and body aches x 1 day. Reports had a surgery 2 days ago and possible exposure to a virus from being in the ED. Has been taking Ibuprofen  and Tramadol .

## 2024-02-23 NOTE — Progress Notes (Signed)
" °  Because or surgery a few days ago and current fever and need for examination, I feel your condition warrants further evaluation and I recommend that you be seen in a face-to-face visit.   NOTE: There will be NO CHARGE for this E-Visit   If you are having a true medical emergency, please call 911.     For an urgent face to face visit, [facility] has multiple urgent care centers for your convenience.  Click the link below for the full list of locations and hours, walk-in wait times, appointment scheduling options and driving directions:  Urgent Care - [place], [place], [place], [place], [place], [place]  [place]     Your MyChart E-visit questionnaire answers were reviewed by a board certified advanced clinical practitioner to complete your personal care plan based on your specific symptoms.    Thank you for using e-Visits.    "

## 2024-02-23 NOTE — Discharge Instructions (Signed)
 Your flu test today was negative.  All of your symptoms can be attributed to your having had surgery 2 days ago.  Please continue your pain medicine as prescribed.  I anticipate you will be feeling better in the next 5 to 7 days.

## 2024-02-25 ENCOUNTER — Ambulatory Visit (HOSPITAL_BASED_OUTPATIENT_CLINIC_OR_DEPARTMENT_OTHER): Payer: Self-pay | Admitting: Emergency Medicine

## 2024-02-26 LAB — CULTURE, BLOOD (ROUTINE X 2)
Culture: NO GROWTH
Culture: NO GROWTH
Special Requests: ADEQUATE
Special Requests: ADEQUATE

## 2024-03-01 ENCOUNTER — Other Ambulatory Visit (HOSPITAL_COMMUNITY): Payer: Self-pay

## 2024-03-01 ENCOUNTER — Other Ambulatory Visit: Payer: Self-pay

## 2024-03-01 NOTE — Progress Notes (Signed)
 Chief Complaint:  1. Left supracondylar humerus fracture, closed, initial encounter  pregabalin (LYRICA) 75 mg capsule   Ambulatory referral to Pain Medicine    2. Radial nerve palsy, left  pregabalin (LYRICA) 75 mg capsule   Ambulatory referral to Pain Medicine        HPI  Susan Boyle is a 34 y.o.  female,  who is seen today for  follow-up evaluation of  1. Left supracondylar humerus fracture, closed, initial encounter  pregabalin (LYRICA) 75 mg capsule   Ambulatory referral to Pain Medicine    2. Radial nerve palsy, left  pregabalin (LYRICA) 75 mg capsule   Ambulatory referral to Pain Medicine          History of Present Illness  The patient is a 34 year old woman who presents for evaluation 1 year status post open treatment of a left humerus fracture. The surgery was performed on 02/19/2023.  She reports persistent pain in her left arm, which prevents her from using the arm for daily activities. The pain is characterized by hypersensitivity to touch and temperature, particularly cold. She also experiences muscle pain and strain, likening it to the sensation of lifting excessively heavy objects. Her ability to grip objects is compromised, often resulting in loss of grip or pain. Despite these symptoms, she reports satisfactory mobility in the arm.   She has been attending occupational therapy sessions twice a week but has not noticed any significant improvement. She is right-handed and has been avoiding driving due to her inability to use her left hand. She has been prescribed Lyrica 75 mg twice daily by Dr. Lucillie, which she found beneficial, but discontinued its use due to the intermittent nature of her pain. She has previously tried gabapentin  but found Lyrica to be more effective. She is currently taking tramadol  50 mg once daily, prescribed by her PCP.  SOCIAL HISTORY Occupations: Works at [employer].  Out of work since injury    PHYSICAL EXAM  GENERAL: No acute  distress.   Well nourished and well hydrated. Appears stated age. PSYCHIATRIC: Normal mood, affect, behavior and judgement HEENT: Normocephalic, atraumatic. Extraocular movements intact.  Mucous membranes moist   NECK: Supple, trachea midline.  RESPIRATORY: Normal respiratory effort and work of breathing  CARDIOVASCULAR: Intact distal pulses SKIN: Warm and dry, no rash. NEUROLOGIC: Alert and oriented.  GAIT: Normal  LEFT UPPER EXTREMITY EXAM INSPECTION: No swelling, erythema, noted INCISION: well healed  ALIGNMENT: normal PALPATION: No tenderness or masses ROM:  Shoulder: functional aROM in all planes Elbow: functional aROM in all planes Wrist: full pROM in all planes SENSORY:  superficial radial (dorsal first web space): sensation is intact to light touch, hypersensitive median (tip of index finger): sensation is intact to light touch, hypersensitive ulnar (tip of small finger): sensation is intact to light touch, hypersensitive  MOTOR: 4/5 motor function throughout   VASCULAR: Extremity warm and well-perfused.    RADIOLOGY  I have personally reviewed the following radiology studies: left humerus xr showing healed fracture(s) s/p ORIF of the humerus without complicating features. Pending final radiology interpretation  ASSESSMENT:   1. Left supracondylar humerus fracture, closed, initial encounter  pregabalin (LYRICA) 75 mg capsule   Ambulatory referral to Pain Medicine    2. Radial nerve palsy, left  pregabalin (LYRICA) 75 mg capsule   Ambulatory referral to Pain Medicine        PLAN   -Images reviewed with patient showing fx has healed -Her radial nerve palsy has improved from a  motor standpoint, but she has some residual hypersensitivity and sensory dysfunction. -I have refilled her Lyrica. -We discussed a pain clinic referral to determine if there are any other medications or modalities they feel would help treat her hypersensitivity. -Wound/incision  care/instructions: n/a  -Weight bearing status: Weight bearing as tolerated on the LEFT upper extremity  -ROM restrictions: none    -Precautions/Limitations: none -work: work as tolerated, with limited use of left arm.   -PT/OT: completed -Pain medication: continue Lyrica (refilled) -Patient has achieved complete healing. They can f/u on a PRN basis. -All questions were addressed to the patient's satisfaction.   I have personally spent 20 minutes involved in face-to-face and non-face-to-face activities for this patient on the day of the visit.   Professional time spent includes the following activities, in addition to those noted in the documentation: reviewing medical records, ordering imaging studies, reviewing and interpreting imaging studies, examining and counseling the patient, ordering future imaging studies, discussing the follow-up plan, and completing the medical documentation.    [facility] Lynnea Bent, MD

## 2024-03-02 ENCOUNTER — Other Ambulatory Visit: Payer: Self-pay

## 2024-03-19 ENCOUNTER — Other Ambulatory Visit (HOSPITAL_COMMUNITY): Payer: Self-pay

## 2024-03-19 MED ORDER — CHOLESTYRAMINE 4 G PO PACK
4.0000 g | PACK | Freq: Two times a day (BID) | ORAL | 0 refills | Status: AC
  Filled 2024-03-19: qty 60, 30d supply, fill #0

## 2024-03-20 ENCOUNTER — Other Ambulatory Visit: Payer: Self-pay

## 2024-03-31 ENCOUNTER — Other Ambulatory Visit (HOSPITAL_COMMUNITY): Payer: Self-pay

## 2024-03-31 MED ORDER — AMPHETAMINE-DEXTROAMPHETAMINE 20 MG PO TABS
20.0000 mg | ORAL_TABLET | Freq: Three times a day (TID) | ORAL | 0 refills | Status: AC
  Filled 2024-03-31: qty 90, 30d supply, fill #0

## 2024-04-01 ENCOUNTER — Other Ambulatory Visit: Payer: Self-pay
